# Patient Record
Sex: Female | Born: 1940
Health system: Southern US, Community
[De-identification: ages and names within clinical notes are randomized; demographics above are authoritative.]

## PROBLEM LIST (undated history)

## (undated) DIAGNOSIS — F32A Depression, unspecified: Secondary | ICD-10-CM

## (undated) DIAGNOSIS — F329 Major depressive disorder, single episode, unspecified: Secondary | ICD-10-CM

## (undated) DIAGNOSIS — I10 Essential (primary) hypertension: Secondary | ICD-10-CM

## (undated) DIAGNOSIS — Z8249 Family history of ischemic heart disease and other diseases of the circulatory system: Secondary | ICD-10-CM

## (undated) DIAGNOSIS — Z973 Presence of spectacles and contact lenses: Secondary | ICD-10-CM

## (undated) DIAGNOSIS — K219 Gastro-esophageal reflux disease without esophagitis: Secondary | ICD-10-CM

## (undated) DIAGNOSIS — C801 Malignant (primary) neoplasm, unspecified: Secondary | ICD-10-CM

## (undated) DIAGNOSIS — K760 Fatty (change of) liver, not elsewhere classified: Secondary | ICD-10-CM

## (undated) DIAGNOSIS — E119 Type 2 diabetes mellitus without complications: Secondary | ICD-10-CM

## (undated) DIAGNOSIS — Z972 Presence of dental prosthetic device (complete) (partial): Secondary | ICD-10-CM

## (undated) DIAGNOSIS — E785 Hyperlipidemia, unspecified: Secondary | ICD-10-CM

## (undated) DIAGNOSIS — Z8719 Personal history of other diseases of the digestive system: Secondary | ICD-10-CM

## (undated) DIAGNOSIS — Z9289 Personal history of other medical treatment: Secondary | ICD-10-CM

## (undated) DIAGNOSIS — H8112 Benign paroxysmal vertigo, left ear: Secondary | ICD-10-CM

## (undated) DIAGNOSIS — E039 Hypothyroidism, unspecified: Secondary | ICD-10-CM

## (undated) DIAGNOSIS — M1990 Unspecified osteoarthritis, unspecified site: Secondary | ICD-10-CM

## (undated) HISTORY — DX: Family history of ischemic heart disease and other diseases of the circulatory system: Z82.49

## (undated) HISTORY — DX: Personal history of other medical treatment: Z92.89

## (undated) HISTORY — DX: Hyperlipidemia, unspecified: E78.5

## (undated) HISTORY — PX: CYSTECTOMY: SUR359

## (undated) HISTORY — DX: Essential (primary) hypertension: I10

---

## 1997-11-24 ENCOUNTER — Other Ambulatory Visit: Admission: RE | Admit: 1997-11-24 | Discharge: 1997-11-24 | Payer: Self-pay | Admitting: Obstetrics and Gynecology

## 1999-11-28 ENCOUNTER — Other Ambulatory Visit: Admission: RE | Admit: 1999-11-28 | Discharge: 1999-11-28 | Payer: Self-pay | Admitting: Obstetrics and Gynecology

## 1999-11-28 ENCOUNTER — Encounter (INDEPENDENT_AMBULATORY_CARE_PROVIDER_SITE_OTHER): Payer: Self-pay | Admitting: Specialist

## 2000-02-23 ENCOUNTER — Encounter: Admission: RE | Admit: 2000-02-23 | Discharge: 2000-02-23 | Payer: Self-pay | Admitting: Obstetrics and Gynecology

## 2000-02-23 ENCOUNTER — Encounter: Payer: Self-pay | Admitting: Obstetrics and Gynecology

## 2001-12-08 ENCOUNTER — Ambulatory Visit (HOSPITAL_COMMUNITY): Admission: RE | Admit: 2001-12-08 | Discharge: 2001-12-08 | Payer: Self-pay | Admitting: Gastroenterology

## 2007-09-08 ENCOUNTER — Encounter: Admission: RE | Admit: 2007-09-08 | Discharge: 2007-09-08 | Payer: Self-pay | Admitting: Endocrinology

## 2011-09-28 ENCOUNTER — Ambulatory Visit (INDEPENDENT_AMBULATORY_CARE_PROVIDER_SITE_OTHER): Payer: Medicare Other | Admitting: Surgery

## 2011-09-28 ENCOUNTER — Encounter (INDEPENDENT_AMBULATORY_CARE_PROVIDER_SITE_OTHER): Payer: Self-pay | Admitting: Surgery

## 2011-09-28 VITALS — BP 158/96 | HR 72 | Temp 97.6°F | Resp 18 | Ht 64.0 in | Wt 189.6 lb

## 2011-09-28 DIAGNOSIS — L723 Sebaceous cyst: Secondary | ICD-10-CM

## 2011-09-28 DIAGNOSIS — L089 Local infection of the skin and subcutaneous tissue, unspecified: Secondary | ICD-10-CM

## 2011-09-28 NOTE — Progress Notes (Signed)
Subjective:     Patient ID: Desiree Rasmussen, female   DOB: 1941/03/19, 70 y.o.   MRN: [id number]  HPI This patient is here for evaluation of an infected sebaceous cyst on her left chest. She is referred by Dr. Conservation officer, historic buildings. He started developing erythema and pain several days ago  Review of Systems     Objective:   Physical Exam On exam, she has an infected sebaceous cyst on her left breast at the medial upper quadrant. After obtaining consent, I prepped the area Betadine. Anesthetized with lidocaine. I then drained purulence and sebaceous debris after making an incision with the scalpel. I then packed the wound with gauze    Assessment:     Infected sebaceous cyst of the left breast    Plan:     Wound care instructions were given. I am going to place her on Keflex. I will also write her for hydrocodone. I will see her back in 2 weeks

## 2011-10-02 HISTORY — PX: COLONOSCOPY: SHX174

## 2011-10-18 ENCOUNTER — Encounter (INDEPENDENT_AMBULATORY_CARE_PROVIDER_SITE_OTHER): Payer: Self-pay | Admitting: Surgery

## 2011-10-18 ENCOUNTER — Ambulatory Visit (INDEPENDENT_AMBULATORY_CARE_PROVIDER_SITE_OTHER): Payer: Medicare Other | Admitting: Surgery

## 2011-10-18 VITALS — BP 150/88 | HR 70 | Temp 97.8°F | Resp 18 | Ht 64.0 in | Wt 189.0 lb

## 2011-10-18 DIAGNOSIS — Z09 Encounter for follow-up examination after completed treatment for conditions other than malignant neoplasm: Secondary | ICD-10-CM

## 2011-10-18 NOTE — Progress Notes (Signed)
Subjective:     Patient ID: Desiree Rasmussen, female   DOB: [birth year], 71 y.o.   MRN: [id number]  HPI  She is here for a followup visit status post incision and drainage of a sebaceous cyst on her breast. She has finished her antibiotics. She is doing well and has no complaints. Review of Systems     Objective:   Physical Exam On exam, the site is well healed without evidence of infection    Assessment:     Patient status post incision and drainage of infected sebaceous cyst on the left breast    Plan:     I will see her back in 3 weeks. We will then discuss whether to remove this area if it recurs

## 2011-11-06 ENCOUNTER — Ambulatory Visit (INDEPENDENT_AMBULATORY_CARE_PROVIDER_SITE_OTHER): Payer: Medicare Other | Admitting: Surgery

## 2011-11-06 ENCOUNTER — Encounter (INDEPENDENT_AMBULATORY_CARE_PROVIDER_SITE_OTHER): Payer: Self-pay | Admitting: Surgery

## 2011-11-06 VITALS — BP 136/84 | HR 72 | Temp 97.9°F | Resp 18 | Ht 64.0 in | Wt 190.2 lb

## 2011-11-06 DIAGNOSIS — N6089 Other benign mammary dysplasias of unspecified breast: Secondary | ICD-10-CM

## 2011-11-06 NOTE — Progress Notes (Signed)
Subjective:     Patient ID: Desiree Rasmussen, female   DOB: 1941-01-25, 71 y.o.   MRN: [id number]  HPI She is here for followup. She reports no problems.  Review of Systems     Objective:   Physical Exam    On exam, the incision on the left upper breast is well-healed. There is no current evidence of a sebaceous cyst has returned Assessment:     Patient status post incision and drainage of a sebaceous cyst on her chest    Plan:     Hopefully I got out most of the capsule in this will not return. If it does return I recommended removal. She will come back should this occur

## 2012-01-24 DIAGNOSIS — H524 Presbyopia: Secondary | ICD-10-CM | POA: Diagnosis not present

## 2012-01-24 DIAGNOSIS — H2589 Other age-related cataract: Secondary | ICD-10-CM | POA: Diagnosis not present

## 2012-04-27 ENCOUNTER — Emergency Department (HOSPITAL_COMMUNITY)
Admission: EM | Admit: 2012-04-27 | Discharge: 2012-04-28 | Disposition: A | Discharge status: Home / Self Care | Payer: Medicare Other | Attending: Emergency Medicine | Admitting: Emergency Medicine

## 2012-04-27 ENCOUNTER — Emergency Department (HOSPITAL_COMMUNITY): Payer: Medicare Other

## 2012-04-27 ENCOUNTER — Encounter (HOSPITAL_COMMUNITY): Payer: Self-pay | Admitting: *Deleted

## 2012-04-27 DIAGNOSIS — Z79899 Other long term (current) drug therapy: Secondary | ICD-10-CM | POA: Insufficient documentation

## 2012-04-27 DIAGNOSIS — Z8739 Personal history of other diseases of the musculoskeletal system and connective tissue: Secondary | ICD-10-CM | POA: Insufficient documentation

## 2012-04-27 DIAGNOSIS — R072 Precordial pain: Secondary | ICD-10-CM | POA: Diagnosis not present

## 2012-04-27 DIAGNOSIS — R071 Chest pain on breathing: Secondary | ICD-10-CM | POA: Insufficient documentation

## 2012-04-27 DIAGNOSIS — M25519 Pain in unspecified shoulder: Secondary | ICD-10-CM | POA: Diagnosis not present

## 2012-04-27 DIAGNOSIS — E049 Nontoxic goiter, unspecified: Secondary | ICD-10-CM

## 2012-04-27 DIAGNOSIS — K449 Diaphragmatic hernia without obstruction or gangrene: Secondary | ICD-10-CM | POA: Diagnosis not present

## 2012-04-27 DIAGNOSIS — E785 Hyperlipidemia, unspecified: Secondary | ICD-10-CM | POA: Insufficient documentation

## 2012-04-27 DIAGNOSIS — M25512 Pain in left shoulder: Secondary | ICD-10-CM

## 2012-04-27 DIAGNOSIS — I10 Essential (primary) hypertension: Secondary | ICD-10-CM | POA: Insufficient documentation

## 2012-04-27 DIAGNOSIS — R0789 Other chest pain: Secondary | ICD-10-CM

## 2012-04-27 DIAGNOSIS — R079 Chest pain, unspecified: Secondary | ICD-10-CM | POA: Diagnosis not present

## 2012-04-27 LAB — URINALYSIS, ROUTINE W REFLEX MICROSCOPIC
Hgb urine dipstick: NEGATIVE
Leukocytes, UA: NEGATIVE
Nitrite: NEGATIVE
Specific Gravity, Urine: 1.02 (ref 1.005–1.030)
Urobilinogen, UA: 0.2 mg/dL (ref 0.0–1.0)
pH: 6.5 (ref 5.0–8.0)

## 2012-04-27 LAB — BASIC METABOLIC PANEL
BUN: 22 mg/dL (ref 6–23)
Chloride: 102 mEq/L (ref 96–112)
GFR calc non Af Amer: 72 mL/min — ABNORMAL LOW (ref >90)
Glucose, Bld: 128 mg/dL — ABNORMAL HIGH (ref 70–99)
Potassium: 3.8 mEq/L (ref 3.5–5.1)
Sodium: 140 mEq/L (ref 135–145)

## 2012-04-27 LAB — CBC
HCT: 44.1 % (ref 36.0–46.0)
Hemoglobin: 14.9 g/dL (ref 12.0–15.0)
MCHC: 33.8 g/dL (ref 30.0–36.0)
RBC: 4.69 MIL/uL (ref 3.87–5.11)
WBC: 6.2 10*3/uL (ref 4.0–10.5)

## 2012-04-27 MED ORDER — IOHEXOL 350 MG/ML SOLN
100.0000 mL | Freq: Once | INTRAVENOUS | Status: AC | PRN
  Administered 2012-04-27: 100 mL via INTRAVENOUS

## 2012-04-27 NOTE — ED Provider Notes (Addendum)
History     CSN: [id number]  Arrival date & time 04/27/12  1631   First MD Initiated Contact with Patient 04/27/12 1750      Chief Complaint  Patient presents with  . Shoulder Pain    (Consider location/radiation/quality/duration/timing/severity/associated sxs/prior treatment) HPI Comments: The patient is a 71 year old woman who says that she has been hurting in her left shoulder for the past 3 days. This seemed to start on Friday, 2 days ago. She said she slept with her left eye by another pill and thought this is like a muscle strain. His today the pain was more severe. She took some hydrocodone with some relief. Today the pain was worse, and a continuous pain. She took 3 Advil that didn't help she went to [place] walk-in [facility], where an EKG was done which showed minor changes that were worrisome. Therefore the physician at [facility] walk-in center to [facility] ED for evaluation. There is no heart disease history. She does have high blood pressure, high cholesterol, hypothyroidism, and arthritis.  Patient is a 71 y.o. female presenting with shoulder pain.  Shoulder Pain This is a new problem. The current episode started 2 days ago. Episode frequency: Intermittent episodes of pain. The problem has been gradually worsening. Associated symptoms comments: The pain originates in the left scapular region and radiates under her left axilla into her left upper chest.. Nothing aggravates the symptoms. Nothing relieves the symptoms. Treatments tried: She has taken hydrocodone-acetaminophen, as well as Advil, with some relief of pain with either of those medications.    Past Medical History  Diagnosis Date  . Arthritis   . Hypertension   . Hyperlipidemia   . Cyst     right breast    Past Surgical History  Procedure Date  . Cesarean section 1969, 1972  . Cystectomy     sebaceous cyst on breast    Family History  Problem Relation Age of Onset  . Cancer Mother 46    colon  . Heart disease  Father 48    heart attack  . Cancer Maternal Aunt 71    breast  . Cancer Maternal Uncle     colon    History  Substance Use Topics  . Smoking status: Never Smoker   . Smokeless tobacco: Never Used  . Alcohol Use: No    OB History    Grav Para Term Preterm Abortions TAB SAB Ect Mult Living                  Review of Systems  Constitutional: Negative for fever and chills.  HENT: Negative.   Eyes: Negative.   Respiratory: Negative.   Cardiovascular: Negative.   Gastrointestinal: Negative.   Genitourinary: Negative.   Musculoskeletal:       Left shoulder pain.  Skin: Negative.     Allergies  Azithromycin and Sulfur  Home Medications   Current Outpatient Rx  Name Route Sig Dispense Refill  . ATENOLOL 25 MG PO TABS Oral Take 25 mg by mouth Daily.     Marland Kitchen CLORAZEPATE DIPOTASSIUM 7.5 MG PO TABS Oral Take 7.5 mg by mouth daily.    Marland Kitchen CLOTRIMAZOLE-BETAMETHASONE 1-0.05 % EX CREA Topical Apply 1 application topically 2 (two) times daily.    . IBUPROFEN 200 MG PO TABS Oral Take 600 mg by mouth every 6 (six) hours as needed. For pain    . MELOXICAM 7.5 MG PO TABS Oral Take 7.5 mg by mouth Daily.     Marland Kitchen  SIMVASTATIN 40 MG PO TABS Oral Take 40 mg by mouth Daily.     Marland Kitchen SYNTHROID 100 MCG PO TABS Oral Take 100 mcg by mouth Daily.     . TRIAMTERENE-HCTZ 37.5-25 MG PO TABS Oral Take 0.5 tablets by mouth daily.       BP 190/82  Pulse 74  Temp 98.4 F (36.9 C) (Oral)  Resp 22  Ht 5' 3.5" (1.613 m)  Wt 190 lb (86.183 kg)  BMI 33.13 kg/m2  SpO2 98%  Physical Exam  Constitutional: She is oriented to person, place, and time. She appears well-developed and well-nourished. No distress.  HENT:  Head: Normocephalic and atraumatic.  Right Ear: External ear normal.  Left Ear: External ear normal.  Mouth/Throat: Oropharynx is clear and moist.  Eyes: Conjunctivae and EOM are normal. Pupils are equal, round, and reactive to light.  Neck: Normal range of motion. Neck supple.    Cardiovascular: Normal rate, regular rhythm and normal heart sounds.   Pulmonary/Chest: Effort normal and breath sounds normal.  Abdominal: Soft. Bowel sounds are normal.  Musculoskeletal: Normal range of motion.       Left shoulder has no palpable deformity, has full ROM.  No loss of pulse, sensation and tendon function in the left arm.  Lymphadenopathy:    She has no cervical adenopathy.  Neurological: She is alert and oriented to person, place, and time.       No sensory or motor deficit.  Skin: Skin is warm and dry.  Psychiatric: She has a normal mood and affect. Her behavior is normal.    ED Course  Procedures (including critical care time)   Labs Reviewed  CBC  BASIC METABOLIC PANEL   Dg Chest 2 View  04/27/2012  *RADIOLOGY REPORT*  Clinical Data: Left-sided chest pain.  Shoulder pain.  History of hypertension.  CHEST - 2 VIEW  Comparison: None.  Findings: Heart size is normal.  The superior mediastinum is notable for right to left shift of the trachea.  Findings may be related to enlarged fibroid.  No evidence for pulmonary edema.  No focal consolidations or pleural effusions.  Degenerative changes are seen in the mid thoracic spine.  IMPRESSION:  1. No evidence for acute cardiopulmonary abnormality. 2.  Right to left shift of the trachea in the superior mediastinum, most consistent with enlarged thyroid.  Further evaluation with thyroid ultrasound is suggested.  Original Report Authenticated By: Patterson Hammersmith, M.D.   6:14 PM  Date: 04/27/2012  Rate: 73  Rhythm: normal sinus rhythm  QRS Axis: normal  Intervals: normal  ST/T Wave abnormalities: nonspecific T wave changes  Conduction Disutrbances:none  Narrative Interpretation: Abnormal EKG.  Old EKG Reviewed: none available  9:19 PM Results for orders placed during the hospital encounter of 04/27/12  CBC      Component Value Range   WBC 6.2  4.0 - 10.5 K/uL   RBC 4.69  3.87 - 5.11 MIL/uL   Hemoglobin 14.9  12.0 -  15.0 g/dL   HCT 11.9  14.7 - 82.9 %   MCV 94.0  78.0 - 100.0 fL   MCH 31.8  26.0 - 34.0 pg   MCHC 33.8  30.0 - 36.0 g/dL   RDW 56.2  13.0 - 86.5 %   Platelets 162  150 - 400 K/uL  BASIC METABOLIC PANEL      Component Value Range   Sodium 140  135 - 145 mEq/L   Potassium 3.8  3.5 - 5.1 mEq/L  Chloride 102  96 - 112 mEq/L   CO2 26  19 - 32 mEq/L   Glucose, Bld 128 (*) 70 - 99 mg/dL   BUN 22  6 - 23 mg/dL   Creatinine, Ser 1.61  0.50 - 1.10 mg/dL   Calcium 9.5  8.4 - 09.6 mg/dL   GFR calc non Af Amer 72 (*) >90 mL/min   GFR calc Af Amer 83 (*) >90 mL/min  URINALYSIS, ROUTINE W REFLEX MICROSCOPIC      Component Value Range   Color, Urine YELLOW  YELLOW   APPearance CLEAR  CLEAR   Specific Gravity, Urine 1.020  1.005 - 1.030   pH 6.5  5.0 - 8.0   Glucose, UA NEGATIVE  NEGATIVE mg/dL   Hgb urine dipstick NEGATIVE  NEGATIVE   Bilirubin Urine NEGATIVE  NEGATIVE   Ketones, ur NEGATIVE  NEGATIVE mg/dL   Protein, ur NEGATIVE  NEGATIVE mg/dL   Urobilinogen, UA 0.2  0.0 - 1.0 mg/dL   Nitrite NEGATIVE  NEGATIVE   Leukocytes, UA NEGATIVE  NEGATIVE  D-DIMER, QUANTITATIVE      Component Value Range   D-Dimer, Quant 0.60 (*) 0.00 - 0.48 ug/mL-FEU  POCT I-STAT TROPONIN I      Component Value Range   Troponin i, poc 0.00  0.00 - 0.08 ng/mL   Comment 3            Dg Chest 2 View  04/27/2012  *RADIOLOGY REPORT*  Clinical Data: Left-sided chest pain.  Shoulder pain.  History of hypertension.  CHEST - 2 VIEW  Comparison: None.  Findings: Heart size is normal.  The superior mediastinum is notable for right to left shift of the trachea.  Findings may be related to enlarged fibroid.  No evidence for pulmonary edema.  No focal consolidations or pleural effusions.  Degenerative changes are seen in the mid thoracic spine.  IMPRESSION:  1. No evidence for acute cardiopulmonary abnormality. 2.  Right to left shift of the trachea in the superior mediastinum, most consistent with enlarged thyroid.  Further  evaluation with thyroid ultrasound is suggested.  Original Report Authenticated By: Patterson Hammersmith, M.D.    D-dimer mildly elevated.  Will get CT angio of chest.    No diagnosis found.  4:28 PM Pt had signed out AMA because of prolonged stay and lack of instruction by me of what to do.  Her tests showed no heart attack and no PE, but did show coronary calcification.  I tried to call her home and got her answering machine.  I also called her internist, Renford Dills, M.D., and her cardiologist, Nanetta Batty, M.D., to try to get followup for her.   I was ultimately able to speak to pt around 6 P.M. When she called me back.  I apologized to her for having failed to inform her of her CT result and for making her wait a long time.  She has been to Dr. Allyson Sabal and has followup evaluation arranged.      Carleene Cooper III, MD 04/28/12 1633    Carleene Cooper III, MD 04/29/12 1951     Carleene Cooper III, MD 05/27/12 1134

## 2012-04-27 NOTE — ED Notes (Signed)
Patient has had left shoulder pain and axillary pain for 3 days.  Today her pain is worse and constant.  She denies chest pain.  Denies sob.  Patient was seen at [facility] and has concerns for ekg changes

## 2012-04-28 DIAGNOSIS — E782 Mixed hyperlipidemia: Secondary | ICD-10-CM | POA: Diagnosis not present

## 2012-04-28 DIAGNOSIS — I10 Essential (primary) hypertension: Secondary | ICD-10-CM | POA: Diagnosis not present

## 2012-04-28 DIAGNOSIS — R079 Chest pain, unspecified: Secondary | ICD-10-CM | POA: Diagnosis not present

## 2012-04-28 NOTE — ED Provider Notes (Signed)
71yo F, c/o left sided post shoulder pain radiating into her left anterior chest wall.   Has been intermittent and worsening over the past 3 days. Cannot reproduce the pain by palpation or body positions changes.  Pt states pain was at its worst this morning after she work up and was walking around.  Pt states she was eval at [facility], sent to the ED for concerns regarding an "abnl EKG."  EKG with NSR, NS T-wave changes, no old to compare.  Troponin normal, D-dimer elevated, CT-A chest without PE but +CAD.  Pt is TIMI score 1-2.  Pt and family informed re: dx testing results, including CT-A +CAD and EKG changes which are concerning for cardiac source of her pain, and that I recommend admission for further evaluation.  Pt refuses admission.  I encouraged pt to stay, continues to refuse.  Pt makes her own medical decisions.  Risks of AMA explained to pt and family, including, but not limited to:  stroke, heart attack, cardiac arrythmia ("irregular heart rate/beat"), "passing out," temporary and/or permanent disability, death.  Pt and family verb understanding and continue to refuse admission, understanding the consequences of their decision.  I encouraged pt to follow up with her Cardiologist Dr. Allyson Sabal this morning and return to the ED immediately if symptoms return, or for any other concerns.  Pt and family verb understanding, agreeable.   Laray Anger, DO 04/28/12 [phone]

## 2012-05-01 DIAGNOSIS — E782 Mixed hyperlipidemia: Secondary | ICD-10-CM | POA: Diagnosis not present

## 2012-05-01 DIAGNOSIS — R079 Chest pain, unspecified: Secondary | ICD-10-CM | POA: Diagnosis not present

## 2012-05-01 DIAGNOSIS — Z8249 Family history of ischemic heart disease and other diseases of the circulatory system: Secondary | ICD-10-CM | POA: Diagnosis not present

## 2012-05-01 DIAGNOSIS — I10 Essential (primary) hypertension: Secondary | ICD-10-CM | POA: Diagnosis not present

## 2012-05-01 DIAGNOSIS — Z9289 Personal history of other medical treatment: Secondary | ICD-10-CM

## 2012-05-01 HISTORY — DX: Personal history of other medical treatment: Z92.89

## 2012-05-08 ENCOUNTER — Other Ambulatory Visit: Payer: Self-pay | Admitting: Internal Medicine

## 2012-05-08 DIAGNOSIS — E041 Nontoxic single thyroid nodule: Secondary | ICD-10-CM

## 2012-05-08 DIAGNOSIS — M79609 Pain in unspecified limb: Secondary | ICD-10-CM | POA: Diagnosis not present

## 2012-05-08 DIAGNOSIS — I10 Essential (primary) hypertension: Secondary | ICD-10-CM | POA: Diagnosis not present

## 2012-05-14 ENCOUNTER — Ambulatory Visit
Admission: RE | Admit: 2012-05-14 | Discharge: 2012-05-14 | Disposition: A | Discharge status: Home / Self Care | Payer: Medicare Other | Source: Ambulatory Visit | Attending: Internal Medicine | Admitting: Internal Medicine

## 2012-05-14 DIAGNOSIS — E041 Nontoxic single thyroid nodule: Secondary | ICD-10-CM

## 2012-05-16 DIAGNOSIS — Z0000 Encounter for general adult medical examination without abnormal findings: Secondary | ICD-10-CM | POA: Diagnosis not present

## 2012-05-16 DIAGNOSIS — E041 Nontoxic single thyroid nodule: Secondary | ICD-10-CM | POA: Diagnosis not present

## 2012-05-16 DIAGNOSIS — I10 Essential (primary) hypertension: Secondary | ICD-10-CM | POA: Diagnosis not present

## 2012-05-16 DIAGNOSIS — E7800 Pure hypercholesterolemia, unspecified: Secondary | ICD-10-CM | POA: Diagnosis not present

## 2012-05-16 DIAGNOSIS — E663 Overweight: Secondary | ICD-10-CM | POA: Diagnosis not present

## 2012-05-19 ENCOUNTER — Other Ambulatory Visit: Payer: Self-pay | Admitting: Internal Medicine

## 2012-05-19 DIAGNOSIS — E041 Nontoxic single thyroid nodule: Secondary | ICD-10-CM

## 2012-05-21 ENCOUNTER — Ambulatory Visit
Admission: RE | Admit: 2012-05-21 | Discharge: 2012-05-21 | Disposition: A | Discharge status: Home / Self Care | Payer: Medicare Other | Source: Ambulatory Visit | Attending: Internal Medicine | Admitting: Internal Medicine

## 2012-05-21 ENCOUNTER — Other Ambulatory Visit (HOSPITAL_COMMUNITY)
Admission: RE | Admit: 2012-05-21 | Discharge: 2012-05-21 | Disposition: A | Discharge status: Home / Self Care | Payer: Medicare Other | Source: Ambulatory Visit | Attending: Diagnostic Radiology | Admitting: Diagnostic Radiology

## 2012-05-21 DIAGNOSIS — E041 Nontoxic single thyroid nodule: Secondary | ICD-10-CM | POA: Diagnosis not present

## 2012-05-21 DIAGNOSIS — E049 Nontoxic goiter, unspecified: Secondary | ICD-10-CM | POA: Diagnosis not present

## 2012-06-05 ENCOUNTER — Telehealth: Payer: Self-pay | Admitting: Emergency Medicine

## 2012-06-05 ENCOUNTER — Other Ambulatory Visit: Payer: Self-pay | Admitting: Internal Medicine

## 2012-06-05 DIAGNOSIS — E041 Nontoxic single thyroid nodule: Secondary | ICD-10-CM

## 2012-06-05 NOTE — Telephone Encounter (Signed)
Message copied by Jari Sportsman on Thu Jun 05, 2012  4:35 PM ------      Message from: [facility], ADAM R      Created: Wed Jun 04, 2012  8:48 AM      Regarding: Need another biopsy       This biopsy was non diagnostic.  Does the physician want [place] to try again?            Adam      ----- Message -----         From: [facility] Interface         Sent: 05/22/2012   3:16 PM           To: Abundio Miu, MD

## 2012-06-05 NOTE — Telephone Encounter (Signed)
CALLED PT AND EXPLAINED THAT THE BX RESULTS WERE INCONCLUSIVE AND DR POLITE HAS AGREED FOR [place] TO REPEAT THE FNA.  SCHEDULED APPT W/ PT  FOR 06-10-12 AT 1015/1030AM

## 2012-06-10 ENCOUNTER — Other Ambulatory Visit (HOSPITAL_COMMUNITY)
Admission: RE | Admit: 2012-06-10 | Discharge: 2012-06-10 | Disposition: A | Discharge status: Home / Self Care | Payer: Medicare Other | Source: Ambulatory Visit | Attending: Interventional Radiology | Admitting: Interventional Radiology

## 2012-06-10 ENCOUNTER — Ambulatory Visit
Admission: RE | Admit: 2012-06-10 | Discharge: 2012-06-10 | Disposition: A | Discharge status: Home / Self Care | Payer: Medicare Other | Source: Ambulatory Visit | Attending: Internal Medicine | Admitting: Internal Medicine

## 2012-06-10 DIAGNOSIS — E041 Nontoxic single thyroid nodule: Secondary | ICD-10-CM | POA: Diagnosis not present

## 2012-06-10 DIAGNOSIS — E049 Nontoxic goiter, unspecified: Secondary | ICD-10-CM | POA: Insufficient documentation

## 2012-06-12 DIAGNOSIS — Z1211 Encounter for screening for malignant neoplasm of colon: Secondary | ICD-10-CM | POA: Diagnosis not present

## 2012-06-12 DIAGNOSIS — K5730 Diverticulosis of large intestine without perforation or abscess without bleeding: Secondary | ICD-10-CM | POA: Diagnosis not present

## 2012-07-09 DIAGNOSIS — Z23 Encounter for immunization: Secondary | ICD-10-CM | POA: Diagnosis not present

## 2012-07-31 DIAGNOSIS — M79609 Pain in unspecified limb: Secondary | ICD-10-CM | POA: Diagnosis not present

## 2012-08-14 DIAGNOSIS — M545 Low back pain: Secondary | ICD-10-CM | POA: Diagnosis not present

## 2012-08-15 DIAGNOSIS — Z01419 Encounter for gynecological examination (general) (routine) without abnormal findings: Secondary | ICD-10-CM | POA: Diagnosis not present

## 2012-08-15 DIAGNOSIS — Z0000 Encounter for general adult medical examination without abnormal findings: Secondary | ICD-10-CM | POA: Diagnosis not present

## 2012-08-15 DIAGNOSIS — Z124 Encounter for screening for malignant neoplasm of cervix: Secondary | ICD-10-CM | POA: Diagnosis not present

## 2012-08-21 DIAGNOSIS — E782 Mixed hyperlipidemia: Secondary | ICD-10-CM | POA: Diagnosis not present

## 2012-08-21 DIAGNOSIS — I10 Essential (primary) hypertension: Secondary | ICD-10-CM | POA: Diagnosis not present

## 2012-09-17 DIAGNOSIS — Z1231 Encounter for screening mammogram for malignant neoplasm of breast: Secondary | ICD-10-CM | POA: Diagnosis not present

## 2012-10-15 DIAGNOSIS — M25569 Pain in unspecified knee: Secondary | ICD-10-CM | POA: Diagnosis not present

## 2012-11-19 DIAGNOSIS — I10 Essential (primary) hypertension: Secondary | ICD-10-CM | POA: Diagnosis not present

## 2012-11-19 DIAGNOSIS — E663 Overweight: Secondary | ICD-10-CM | POA: Diagnosis not present

## 2012-11-19 DIAGNOSIS — E7800 Pure hypercholesterolemia, unspecified: Secondary | ICD-10-CM | POA: Diagnosis not present

## 2012-11-19 DIAGNOSIS — E039 Hypothyroidism, unspecified: Secondary | ICD-10-CM | POA: Diagnosis not present

## 2012-12-09 ENCOUNTER — Other Ambulatory Visit: Payer: Self-pay | Admitting: Dermatology

## 2012-12-09 DIAGNOSIS — L723 Sebaceous cyst: Secondary | ICD-10-CM | POA: Diagnosis not present

## 2012-12-09 DIAGNOSIS — D234 Other benign neoplasm of skin of scalp and neck: Secondary | ICD-10-CM | POA: Diagnosis not present

## 2012-12-09 DIAGNOSIS — D2330 Other benign neoplasm of skin of unspecified part of face: Secondary | ICD-10-CM | POA: Diagnosis not present

## 2012-12-09 DIAGNOSIS — L819 Disorder of pigmentation, unspecified: Secondary | ICD-10-CM | POA: Diagnosis not present

## 2012-12-09 DIAGNOSIS — D485 Neoplasm of uncertain behavior of skin: Secondary | ICD-10-CM | POA: Diagnosis not present

## 2012-12-09 DIAGNOSIS — L988 Other specified disorders of the skin and subcutaneous tissue: Secondary | ICD-10-CM | POA: Diagnosis not present

## 2012-12-09 DIAGNOSIS — L821 Other seborrheic keratosis: Secondary | ICD-10-CM | POA: Diagnosis not present

## 2012-12-09 DIAGNOSIS — D239 Other benign neoplasm of skin, unspecified: Secondary | ICD-10-CM | POA: Diagnosis not present

## 2013-01-28 DIAGNOSIS — H2589 Other age-related cataract: Secondary | ICD-10-CM | POA: Diagnosis not present

## 2013-02-03 DIAGNOSIS — L578 Other skin changes due to chronic exposure to nonionizing radiation: Secondary | ICD-10-CM | POA: Diagnosis not present

## 2013-02-03 DIAGNOSIS — L821 Other seborrheic keratosis: Secondary | ICD-10-CM | POA: Diagnosis not present

## 2013-05-18 DIAGNOSIS — I10 Essential (primary) hypertension: Secondary | ICD-10-CM | POA: Diagnosis not present

## 2013-05-18 DIAGNOSIS — Z0000 Encounter for general adult medical examination without abnormal findings: Secondary | ICD-10-CM | POA: Diagnosis not present

## 2013-05-18 DIAGNOSIS — E039 Hypothyroidism, unspecified: Secondary | ICD-10-CM | POA: Diagnosis not present

## 2013-05-18 DIAGNOSIS — Z1331 Encounter for screening for depression: Secondary | ICD-10-CM | POA: Diagnosis not present

## 2013-07-06 DIAGNOSIS — Z23 Encounter for immunization: Secondary | ICD-10-CM | POA: Diagnosis not present

## 2013-07-15 ENCOUNTER — Encounter: Payer: Self-pay | Admitting: *Deleted

## 2013-07-20 ENCOUNTER — Ambulatory Visit (INDEPENDENT_AMBULATORY_CARE_PROVIDER_SITE_OTHER): Payer: Medicare Other | Admitting: Cardiovascular Disease

## 2013-07-20 ENCOUNTER — Encounter: Payer: Self-pay | Admitting: Cardiovascular Disease

## 2013-07-20 VITALS — BP 158/94 | HR 61 | Ht 63.5 in | Wt 193.0 lb

## 2013-07-20 DIAGNOSIS — I10 Essential (primary) hypertension: Secondary | ICD-10-CM | POA: Diagnosis not present

## 2013-07-20 DIAGNOSIS — E785 Hyperlipidemia, unspecified: Secondary | ICD-10-CM

## 2013-07-20 NOTE — Patient Instructions (Signed)
Your physician wants you to follow-up in: 1 year with Dr Berry. You will receive a reminder letter in the mail two months in advance. If you don't receive a letter, please call our office to schedule the follow-up appointment.  

## 2013-07-20 NOTE — Assessment & Plan Note (Signed)
On statin therapy followed by her PCP, Dr. Conservation officer, historic buildings

## 2013-07-20 NOTE — Progress Notes (Signed)
07/20/2013 Desiree Rasmussen   [birth year]  [id number]  Primary Physician Katy Apo, MD Primary Cardiologist: Runell Gess MD Roseanne Reno   HPI:  The patient returns today for followup. She is a 72 year old, mildly overweight, married Caucasian female, mother of two, grandmother to two grandchildren, whose husband is accompanying her and is also a patient of mine. I last saw her four months ago. She has a history of hypertension and hyperlipidemia, as well as a family history of heart disease. She had chest pain and ruled out myocardial infarction on an ER visit earlier this year, and when I saw her she was complaining of atypical chest pain. A Myoview stress test performed in our office on May 01, 2012, was entirely normal. I thought her pain was more musculoskeletal and/or radicular and her symptoms subsequently resolved. Her most recent lipid profile, which was nonfasting was drawn by Dr. Nehemiah Settle in August of last year revealing a total cholesterol of 211, LDL of 106, HDL of 42, and triglyceride level of 379.since I saw her a year ago she denies chest pain or shortness of breath.    Current Outpatient Prescriptions  Medication Sig Dispense Refill  . atenolol (TENORMIN) 25 MG tablet Take 25 mg by mouth Daily.       . clorazepate (TRANXENE) 7.5 MG tablet Take 7.5 mg by mouth daily.      . clotrimazole-betamethasone (LOTRISONE) cream Apply 1 application topically 2 (two) times daily.      Marland Kitchen ibuprofen (ADVIL,MOTRIN) 200 MG tablet Take 600 mg by mouth every 6 (six) hours as needed. For pain      . meloxicam (MOBIC) 7.5 MG tablet Take 7.5 mg by mouth Daily.       . simvastatin (ZOCOR) 40 MG tablet Take 40 mg by mouth Daily.       Marland Kitchen SYNTHROID 100 MCG tablet Take 100 mcg by mouth Daily.       Marland Kitchen triamterene-hydrochlorothiazide (MAXZIDE-25) 37.5-25 MG per tablet Take 1 tablet by mouth daily.        No current facility-administered medications for this visit.    Allergies    Allergen Reactions  . Azithromycin Nausea Only and Other (See Comments)    Caused stomach cramping. Anything in Mycin family caused reaction a long time ago.  . Sulfur Rash    All over the body    History   Social History  . Marital Status: Married    Spouse Name: N/A    Number of Children: 2  . Years of Education: N/A   Occupational History  . Not on file.   Social History Main Topics  . Smoking status: Never Smoker   . Smokeless tobacco: Never Used  . Alcohol Use: No  . Drug Use: No  . Sexual Activity: Not on file   Other Topics Concern  . Not on file   Social History Narrative  . No narrative on file     Review of Systems: General: negative for chills, fever, night sweats or weight changes.  Cardiovascular: negative for chest pain, dyspnea on exertion, edema, orthopnea, palpitations, paroxysmal nocturnal dyspnea or shortness of breath Dermatological: negative for rash Respiratory: negative for cough or wheezing Urologic: negative for hematuria Abdominal: negative for nausea, vomiting, diarrhea, bright red blood per rectum, melena, or hematemesis Neurologic: negative for visual changes, syncope, or dizziness All other systems reviewed and are otherwise negative except as noted above.    Blood pressure 158/94, pulse 61, height 5' 3.5" (  1.613 m), weight 193 lb (87.544 kg).  General appearance: alert and no distress Neck: no adenopathy, no carotid bruit, no JVD, supple, symmetrical, trachea midline and thyroid not enlarged, symmetric, no tenderness/mass/nodules Lungs: clear to auscultation bilaterally Heart: regular rate and rhythm, S1, S2 normal, no murmur, click, rub or gallop Extremities: extremities normal, atraumatic, no cyanosis or edema  EKG sinus rhythm at 61 without ST or T wave changes  ASSESSMENT AND PLAN:   Hyperlipidemia On statin therapy followed by her PCP, Dr. polite  Essential hypertension Borderline control on current  medications      Runell Gess MD [facility] 07/20/2013 9:56 AM

## 2013-07-20 NOTE — Assessment & Plan Note (Signed)
Borderline control on current medications 

## 2013-09-18 ENCOUNTER — Encounter: Payer: Self-pay | Admitting: Cardiovascular Disease

## 2013-09-18 DIAGNOSIS — Z1231 Encounter for screening mammogram for malignant neoplasm of breast: Secondary | ICD-10-CM | POA: Diagnosis not present

## 2013-11-11 DIAGNOSIS — J040 Acute laryngitis: Secondary | ICD-10-CM | POA: Diagnosis not present

## 2013-11-11 DIAGNOSIS — J209 Acute bronchitis, unspecified: Secondary | ICD-10-CM | POA: Diagnosis not present

## 2013-11-16 DIAGNOSIS — J069 Acute upper respiratory infection, unspecified: Secondary | ICD-10-CM | POA: Diagnosis not present

## 2013-11-16 DIAGNOSIS — K120 Recurrent oral aphthae: Secondary | ICD-10-CM | POA: Diagnosis not present

## 2013-11-16 DIAGNOSIS — F4321 Adjustment disorder with depressed mood: Secondary | ICD-10-CM | POA: Diagnosis not present

## 2013-12-14 DIAGNOSIS — E039 Hypothyroidism, unspecified: Secondary | ICD-10-CM | POA: Diagnosis not present

## 2013-12-14 DIAGNOSIS — R21 Rash and other nonspecific skin eruption: Secondary | ICD-10-CM | POA: Diagnosis not present

## 2013-12-14 DIAGNOSIS — F4321 Adjustment disorder with depressed mood: Secondary | ICD-10-CM | POA: Diagnosis not present

## 2013-12-14 DIAGNOSIS — I10 Essential (primary) hypertension: Secondary | ICD-10-CM | POA: Diagnosis not present

## 2013-12-14 DIAGNOSIS — E7800 Pure hypercholesterolemia, unspecified: Secondary | ICD-10-CM | POA: Diagnosis not present

## 2014-02-03 DIAGNOSIS — L821 Other seborrheic keratosis: Secondary | ICD-10-CM | POA: Diagnosis not present

## 2014-02-03 DIAGNOSIS — L570 Actinic keratosis: Secondary | ICD-10-CM | POA: Diagnosis not present

## 2014-02-03 DIAGNOSIS — L608 Other nail disorders: Secondary | ICD-10-CM | POA: Diagnosis not present

## 2014-02-03 DIAGNOSIS — D239 Other benign neoplasm of skin, unspecified: Secondary | ICD-10-CM | POA: Diagnosis not present

## 2014-02-03 DIAGNOSIS — D2360 Other benign neoplasm of skin of unspecified upper limb, including shoulder: Secondary | ICD-10-CM | POA: Diagnosis not present

## 2014-02-03 DIAGNOSIS — D1801 Hemangioma of skin and subcutaneous tissue: Secondary | ICD-10-CM | POA: Diagnosis not present

## 2014-02-23 DIAGNOSIS — J40 Bronchitis, not specified as acute or chronic: Secondary | ICD-10-CM | POA: Diagnosis not present

## 2014-02-25 DIAGNOSIS — T887XXA Unspecified adverse effect of drug or medicament, initial encounter: Secondary | ICD-10-CM | POA: Diagnosis not present

## 2014-02-25 DIAGNOSIS — J40 Bronchitis, not specified as acute or chronic: Secondary | ICD-10-CM | POA: Diagnosis not present

## 2014-03-02 DIAGNOSIS — R300 Dysuria: Secondary | ICD-10-CM | POA: Diagnosis not present

## 2014-05-19 DIAGNOSIS — Z0000 Encounter for general adult medical examination without abnormal findings: Secondary | ICD-10-CM | POA: Diagnosis not present

## 2014-05-19 DIAGNOSIS — E7800 Pure hypercholesterolemia, unspecified: Secondary | ICD-10-CM | POA: Diagnosis not present

## 2014-05-19 DIAGNOSIS — E039 Hypothyroidism, unspecified: Secondary | ICD-10-CM | POA: Diagnosis not present

## 2014-05-19 DIAGNOSIS — I10 Essential (primary) hypertension: Secondary | ICD-10-CM | POA: Diagnosis not present

## 2014-05-19 DIAGNOSIS — N183 Chronic kidney disease, stage 3 unspecified: Secondary | ICD-10-CM | POA: Diagnosis not present

## 2014-05-19 DIAGNOSIS — Z23 Encounter for immunization: Secondary | ICD-10-CM | POA: Diagnosis not present

## 2014-05-19 DIAGNOSIS — E663 Overweight: Secondary | ICD-10-CM | POA: Diagnosis not present

## 2014-05-19 DIAGNOSIS — Z1331 Encounter for screening for depression: Secondary | ICD-10-CM | POA: Diagnosis not present

## 2014-06-16 IMAGING — CT CT ANGIO CHEST
2 of 6 series · 19 of 46 positions shown · IV contrast (APPLIED)
Comparison: 04/27/2012 chest radiograph

CLINICAL DATA: Left-sided chest pain

CT ANGIOGRAPHY CHEST
TECHNIQUE: Multidetector CT imaging of the chest using the
standard protocol during bolus administration of intravenous
contrast. Multiplanar reconstructed images including MIPs were
obtained and reviewed to evaluate the vascular anatomy.
Contrast: 100mL OMNIPAQUE IOHEXOL 350 MG/ML SOLN

[Series 6: pulm embolism 1.0 b25f thin · axial · 0.70mm/px · z∈[+1211,+1467]mm · 16 of 282 slices shown]
[im 13/282  lung]
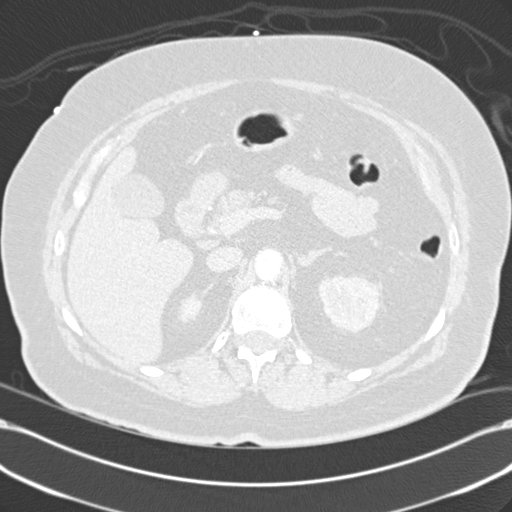
[im 37/282  soft-tissue]
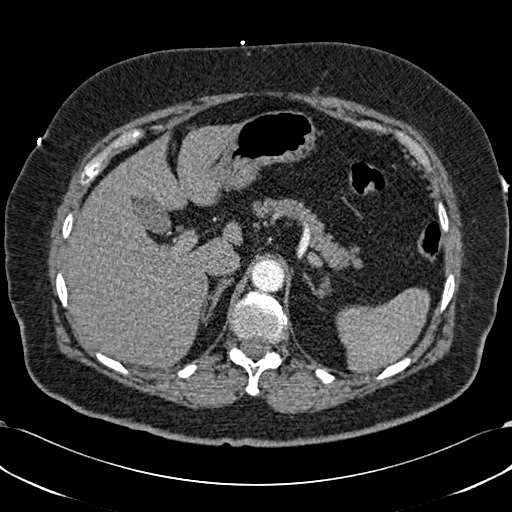
[im 49/282  lung]
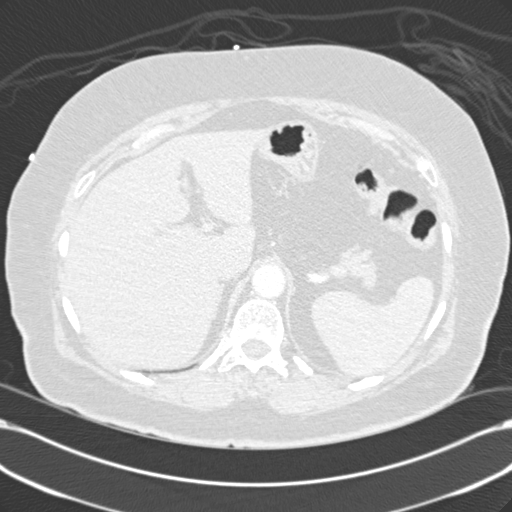
[im 62/282  soft-tissue]
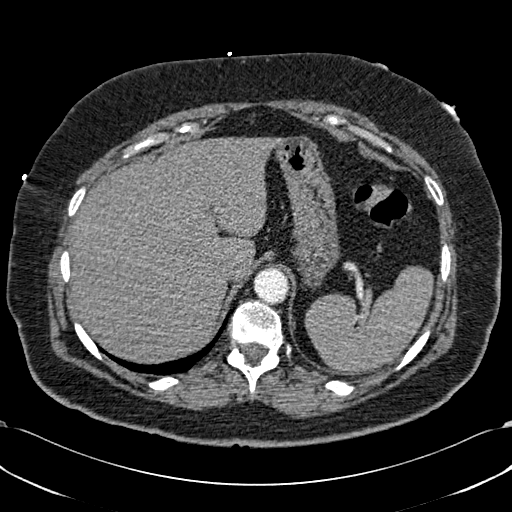
[im 86/282  lung]
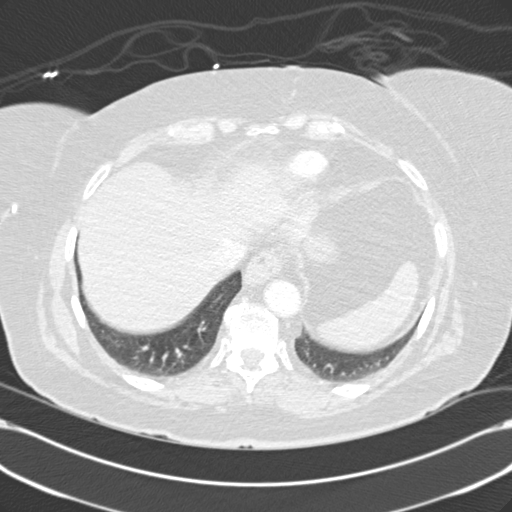
[im 98/282  soft-tissue]
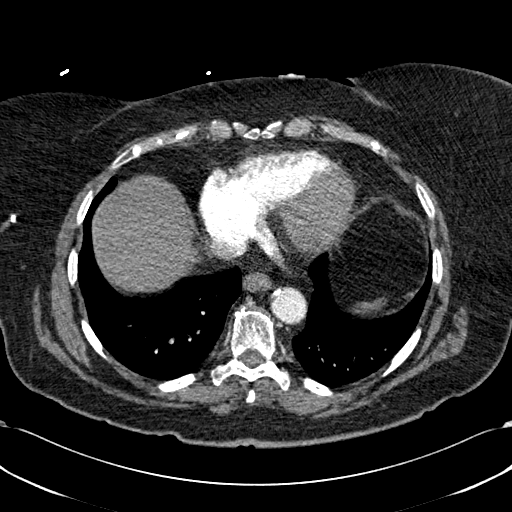
[im 110/282  lung]
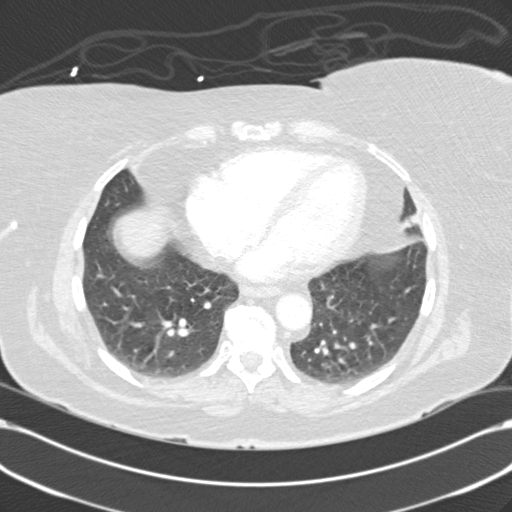
[im 135/282  soft-tissue]
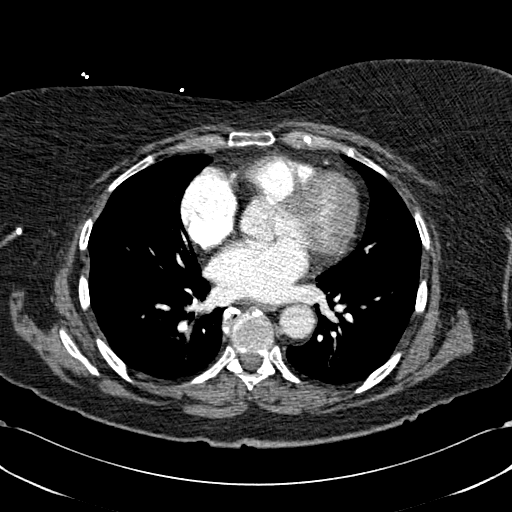
[im 147/282  lung]
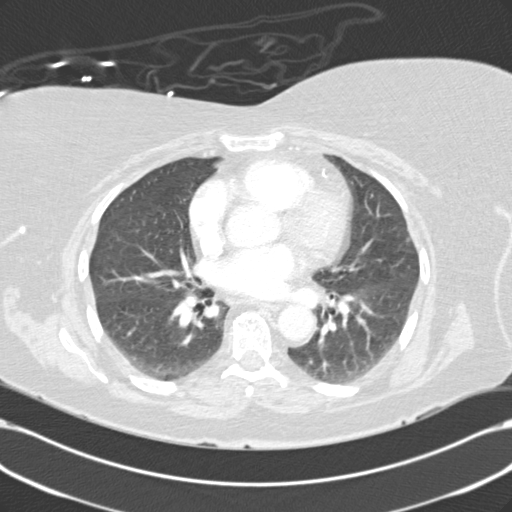
[im 172/282  soft-tissue]
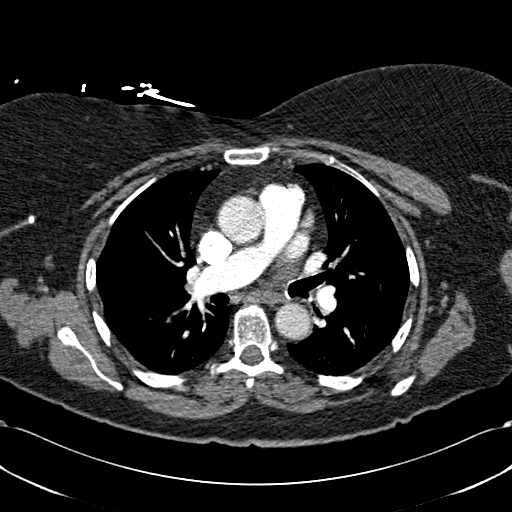
[im 184/282  lung]
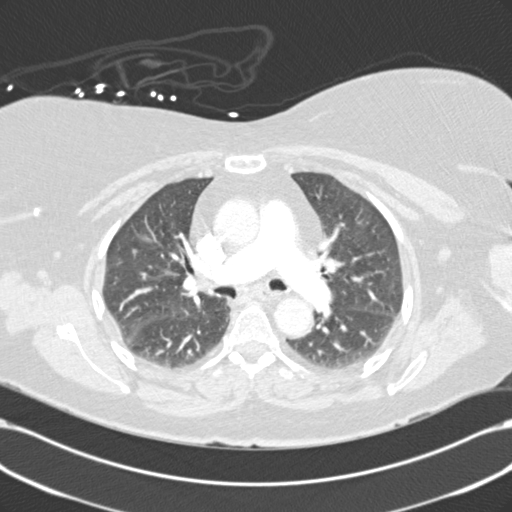
[im 196/282  soft-tissue]
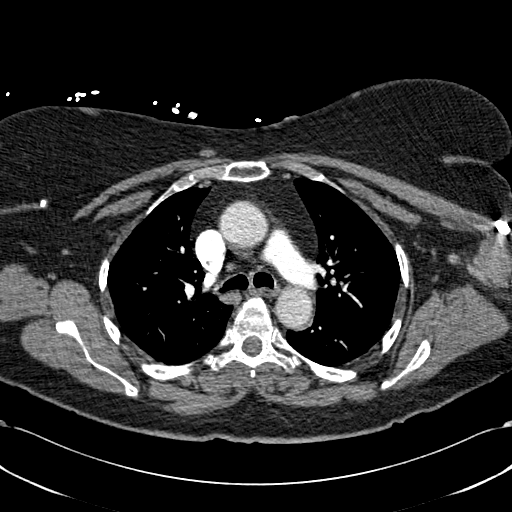
[im 220/282  lung]
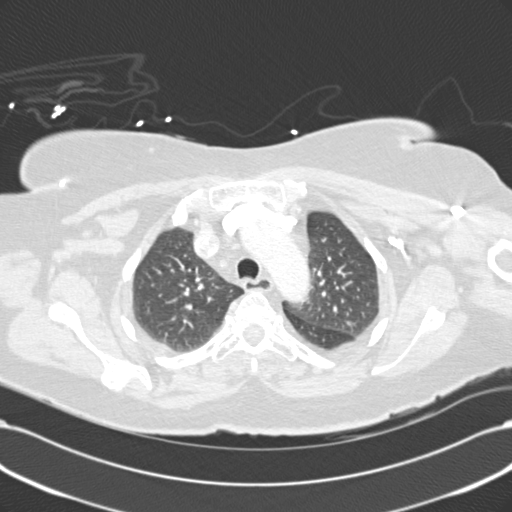
[im 233/282  soft-tissue]
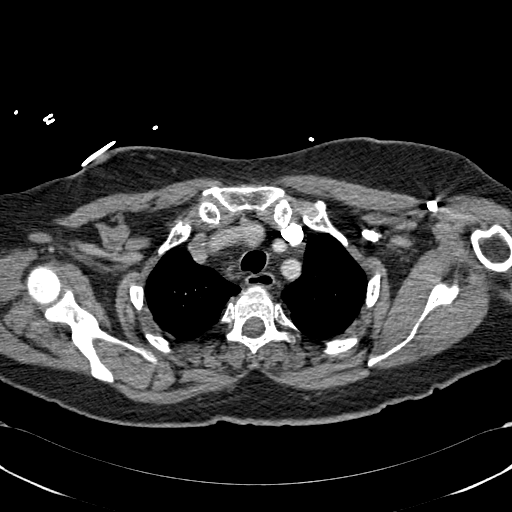
[im 245/282  lung]
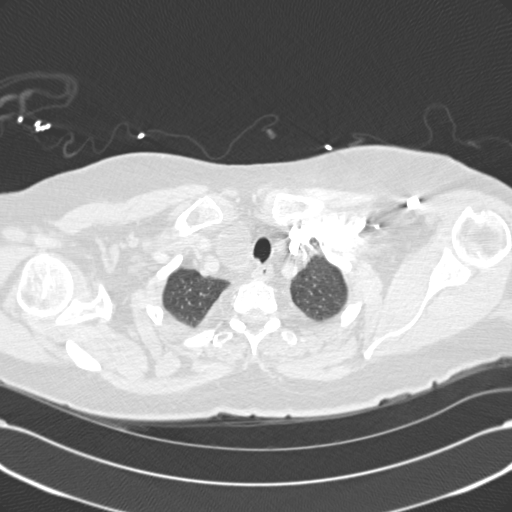
[im 269/282  soft-tissue]
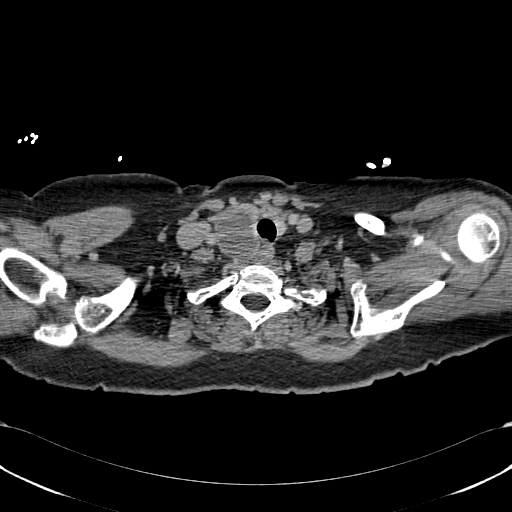

[Series 8: coronals · coronal · 0.70mm/px · 3 of 129 slices shown]
[im 33/129  soft-tissue]
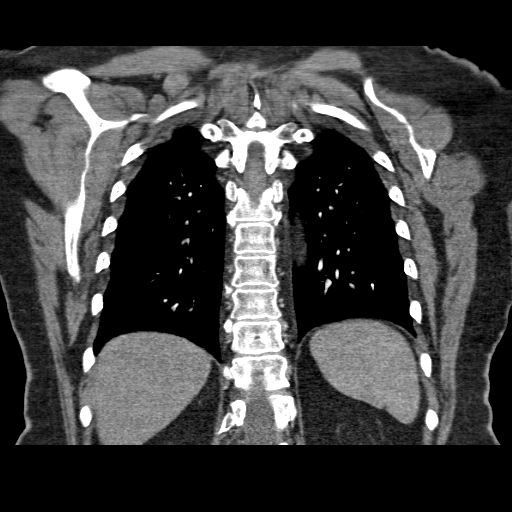
[im 65/129  soft-tissue]
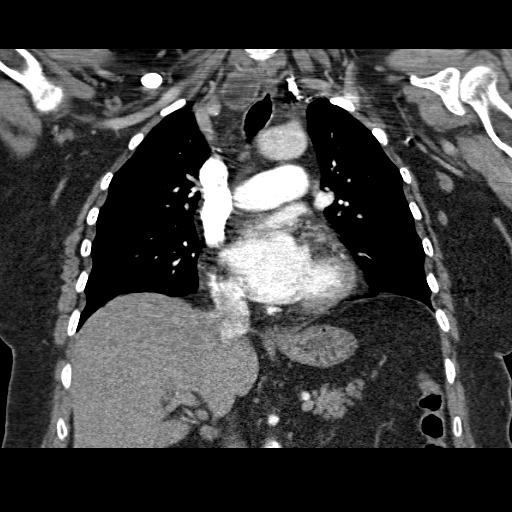
[im 97/129  soft-tissue]
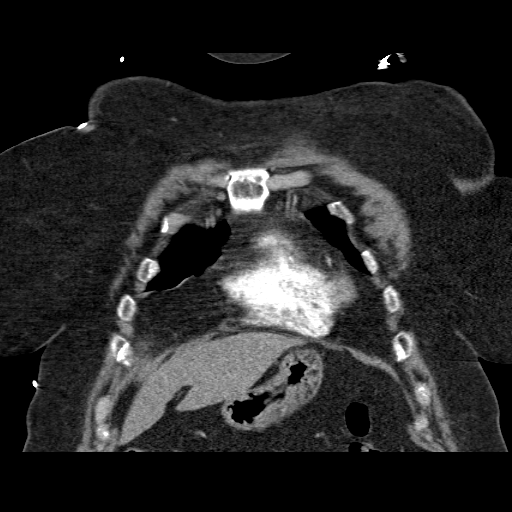

[19 of 46 positions shown; findings below may reference images not displayed]

FINDINGS: Enlarged right lobe of the thyroid gland with a
heterogeneous/hypodense nodule measuring 3.5 cm.  No pulmonary
arterial branch filling defect is identified.  Some of the lower
lobe branches are degraded by respiratory motion and subsegmental
branches are poorly opacified due to bolus timing.

Normal heart size.  Coronary artery calcification.  No pleural or
pericardial effusion.  No intrathoracic lymphadenopathy.  Normal
caliber aorta with scattered atherosclerosis. Tiny hiatal hernia.

Detailed parenchymal evaluation is degraded by respiratory motion.
Within this limitation, no focal consolidation.  No pneumothorax.

Limited images through the upper abdomen demonstrate punctate
calcification within the right hepatic lobe, in keeping with prior
granulomas infection.  No acute finding.

Multilevel degenerative changes of the imaged spine. No acute or
aggressive appearing osseous lesion.
IMPRESSION: No pulmonary embolism or acute intrathoracic process identified.
Some of the peripheral branches are poorly evaluated due to
respiratory motion and contrast bolus timing.

Enlarged right thyroid lobe with a heterogeneous/hypodense nodule.
Recommend ultrasound and FNA if warranted.

Coronary artery calcification.

## 2014-07-12 DIAGNOSIS — Z23 Encounter for immunization: Secondary | ICD-10-CM | POA: Diagnosis not present

## 2014-07-20 ENCOUNTER — Ambulatory Visit (INDEPENDENT_AMBULATORY_CARE_PROVIDER_SITE_OTHER): Payer: Medicare Other | Admitting: Cardiovascular Disease

## 2014-07-20 ENCOUNTER — Encounter: Payer: Self-pay | Admitting: Cardiovascular Disease

## 2014-07-20 VITALS — BP 144/82 | HR 62 | Ht 64.0 in | Wt 191.1 lb

## 2014-07-20 DIAGNOSIS — I10 Essential (primary) hypertension: Secondary | ICD-10-CM

## 2014-07-20 DIAGNOSIS — E785 Hyperlipidemia, unspecified: Secondary | ICD-10-CM | POA: Diagnosis not present

## 2014-07-20 NOTE — Assessment & Plan Note (Signed)
On statin therapy, followed by her PCP 

## 2014-07-20 NOTE — Progress Notes (Signed)
07/20/2014 Desiree Rasmussen   July 04, 1941  [id number]  Primary Physician Desiree Hams, MD Primary Cardiologist: Desiree Harp MD Desiree Rasmussen   HPI:  The patient returns today for followup. She is a 73 year old, mildly overweight, married Caucasian female, mother of two, grandmother to two grandchildren, whose husband , Desiree Rasmussen, unfortunately passed away on 12/02/2013. He was a patient of mine as well. He apparently died of complications of pneumonia. She is currently breathing.. I last saw her 12 months ago. She has a history of hypertension and hyperlipidemia, as well as a family history of heart disease. She had chest pain and ruled out myocardial infarction on an ER visit earlier last year, and when I saw her she was complaining of atypical chest pain. A Myoview stress test performed in our office on May 01, 2012, was entirely normal. I thought her pain was more musculoskeletal and/or radicular and her symptoms subsequently resolved. Her most recent lipid profile, which was nonfasting was drawn by Dr. Delfina Rasmussen in August 2013 revealing a total cholesterol of 211, LDL of 106, HDL of 42, and triglyceride level of 379.since I saw her a year ago she denies chest pain or shortness of breath.    Current Outpatient Prescriptions  Medication Sig Dispense Refill  . atenolol (TENORMIN) 25 MG tablet Take 25 mg by mouth Daily.       . clorazepate (TRANXENE) 7.5 MG tablet Take 7.5 mg by mouth daily.      . Diaper Rash Products (DESITIN) OINT Apply 1 application topically as needed.      Marland Kitchen ibuprofen (ADVIL,MOTRIN) 200 MG tablet Take 600 mg by mouth every 6 (six) hours as needed. For pain      . meloxicam (MOBIC) 7.5 MG tablet Take 7.5 mg by mouth Daily.       . simvastatin (ZOCOR) 40 MG tablet Take 40 mg by mouth Daily.       Marland Kitchen SYNTHROID 100 MCG tablet Take 100 mcg by mouth Daily.       Marland Kitchen triamterene-hydrochlorothiazide (MAXZIDE-25) 37.5-25 MG per tablet Take 0.5 tablets by mouth  daily.        No current facility-administered medications for this visit.    Allergies  Allergen Reactions  . Azithromycin Nausea Only and Other (See Comments)    Caused stomach cramping. Anything in Mycin family caused reaction a long time ago.  . Sulfur Rash    All over the body    History   Social History  . Marital Status: Married    Spouse Name: N/A    Number of Children: 2  . Years of Education: N/A   Occupational History  . Not on file.   Social History Main Topics  . Smoking status: Never Smoker   . Smokeless tobacco: Never Used  . Alcohol Use: No  . Drug Use: No  . Sexual Activity: Not on file   Other Topics Concern  . Not on file   Social History Narrative  . No narrative on file     Review of Systems: General: negative for chills, fever, night sweats or weight changes.  Cardiovascular: negative for chest pain, dyspnea on exertion, edema, orthopnea, palpitations, paroxysmal nocturnal dyspnea or shortness of breath Dermatological: negative for rash Respiratory: negative for cough or wheezing Urologic: negative for hematuria Abdominal: negative for nausea, vomiting, diarrhea, bright red blood per rectum, melena, or hematemesis Neurologic: negative for visual changes, syncope, or dizziness All other systems reviewed and are otherwise negative except  as noted above.    Blood pressure 144/82, pulse 62, height 5\' 4"  (1.626 m), weight 191 lb 1.6 oz (86.682 kg).  General appearance: alert and no distress Neck: no adenopathy, no carotid bruit, no JVD, supple, symmetrical, trachea midline and thyroid not enlarged, symmetric, no tenderness/mass/nodules Lungs: clear to auscultation bilaterally Heart: regular rate and rhythm, S1, S2 normal, no murmur, click, rub or gallop Extremities: extremities normal, atraumatic, no cyanosis or edema  EKG normal sinus rhythm at 62 without ST or T wave changes  ASSESSMENT AND PLAN:   Essential hypertension Controlled on  current medications  Hyperlipidemia On statin therapy, followed by her PCP      Desiree Harp MD [facility], [facility] 07/20/2014 11:20 AM

## 2014-07-20 NOTE — Patient Instructions (Signed)
Dr Berry recommends that you schedule a follow-up appointment in 1 year. You will receive a reminder letter in the mail two months in advance. If you don't receive a letter, please call our office to schedule the follow-up appointment. 

## 2014-07-20 NOTE — Assessment & Plan Note (Signed)
Controlled on current medications 

## 2014-08-13 DIAGNOSIS — H2513 Age-related nuclear cataract, bilateral: Secondary | ICD-10-CM | POA: Diagnosis not present

## 2014-08-13 DIAGNOSIS — H5203 Hypermetropia, bilateral: Secondary | ICD-10-CM | POA: Diagnosis not present

## 2014-08-13 DIAGNOSIS — H52223 Regular astigmatism, bilateral: Secondary | ICD-10-CM | POA: Diagnosis not present

## 2014-08-17 DIAGNOSIS — Z124 Encounter for screening for malignant neoplasm of cervix: Secondary | ICD-10-CM | POA: Diagnosis not present

## 2014-08-17 DIAGNOSIS — Z01419 Encounter for gynecological examination (general) (routine) without abnormal findings: Secondary | ICD-10-CM | POA: Diagnosis not present

## 2014-08-17 DIAGNOSIS — Z0000 Encounter for general adult medical examination without abnormal findings: Secondary | ICD-10-CM | POA: Diagnosis not present

## 2014-09-20 DIAGNOSIS — Z1231 Encounter for screening mammogram for malignant neoplasm of breast: Secondary | ICD-10-CM | POA: Diagnosis not present

## 2014-09-20 DIAGNOSIS — Z803 Family history of malignant neoplasm of breast: Secondary | ICD-10-CM | POA: Diagnosis not present

## 2014-11-03 DIAGNOSIS — M79673 Pain in unspecified foot: Secondary | ICD-10-CM | POA: Diagnosis not present

## 2014-11-08 ENCOUNTER — Encounter: Payer: Medicare Other | Admitting: Podiatry

## 2014-11-08 ENCOUNTER — Ambulatory Visit: Payer: Medicare Other

## 2014-11-08 ENCOUNTER — Ambulatory Visit: Payer: Medicare Other | Admitting: Podiatry

## 2014-11-08 DIAGNOSIS — M79671 Pain in right foot: Secondary | ICD-10-CM

## 2014-11-17 DIAGNOSIS — E780 Pure hypercholesterolemia: Secondary | ICD-10-CM | POA: Diagnosis not present

## 2014-11-17 DIAGNOSIS — I10 Essential (primary) hypertension: Secondary | ICD-10-CM | POA: Diagnosis not present

## 2014-11-17 DIAGNOSIS — Z6834 Body mass index (BMI) 34.0-34.9, adult: Secondary | ICD-10-CM | POA: Diagnosis not present

## 2014-11-17 DIAGNOSIS — M79673 Pain in unspecified foot: Secondary | ICD-10-CM | POA: Diagnosis not present

## 2014-11-17 DIAGNOSIS — N183 Chronic kidney disease, stage 3 (moderate): Secondary | ICD-10-CM | POA: Diagnosis not present

## 2014-11-17 DIAGNOSIS — E039 Hypothyroidism, unspecified: Secondary | ICD-10-CM | POA: Diagnosis not present

## 2014-11-17 DIAGNOSIS — E663 Overweight: Secondary | ICD-10-CM | POA: Diagnosis not present

## 2014-11-18 ENCOUNTER — Ambulatory Visit (INDEPENDENT_AMBULATORY_CARE_PROVIDER_SITE_OTHER): Payer: Medicare Other

## 2014-11-18 ENCOUNTER — Ambulatory Visit (INDEPENDENT_AMBULATORY_CARE_PROVIDER_SITE_OTHER): Payer: Medicare Other | Admitting: Podiatry

## 2014-11-18 ENCOUNTER — Encounter: Payer: Self-pay | Admitting: Podiatry

## 2014-11-18 VITALS — BP 159/81 | HR 61 | Resp 13 | Ht 64.0 in | Wt 195.0 lb

## 2014-11-18 DIAGNOSIS — M722 Plantar fascial fibromatosis: Secondary | ICD-10-CM | POA: Diagnosis not present

## 2014-11-18 MED ORDER — TRIAMCINOLONE ACETONIDE 10 MG/ML IJ SUSP
10.0000 mg | Freq: Once | INTRAMUSCULAR | Status: AC
  Administered 2014-11-18: 10 mg

## 2014-11-18 NOTE — Patient Instructions (Signed)

## 2014-11-18 NOTE — Progress Notes (Signed)
Subjective:     Patient ID: Desiree Rasmussen, female   DOB: 09-May-1941, 75 y.o.   MRN: [id number]  HPI patient states she's developed a lot of pain in the plantar aspect of her right heel and it's been going on for at least a month. States she's been walking differently and developing pain on the outside of her right foot and also her left foot is starting to hurt from putting extra pressure on   Review of Systems  All other systems reviewed and are negative.      Objective:   Physical Exam  Constitutional: She is oriented to person, place, and time.  Cardiovascular: Intact distal pulses.   Musculoskeletal: Normal range of motion.  Neurological: She is oriented to person, place, and time.  Skin: Skin is warm.  Nursing note and vitals reviewed.  neurovascular status found to be intact with muscle strength adequate range of motion subtalar midtarsal joint within normal limits. Patient's noted to have severe discomfort plantar aspect right heel at the insertion of the tendon into the calcaneus that's very sore when pressed and is also noted to have mild equinus condition.     Assessment:     Plantar fasciitis right of an acute nature with compensatory changes noted    Plan:     H&P and x-rays reviewed and today injected the right plantar fascia 3 mg Kenalog 5 g Xylocaine and applied fascial brace. Instructed on reduced activity and physical therapy and will be seen back to recheck

## 2014-11-18 NOTE — Progress Notes (Signed)
   Subjective:    Patient ID: Desiree Rasmussen, female    DOB: [birth year], 74 y.o.   MRN: [id number]  HPI Comments: Pt complains of right plantar heel pain for 1 month, pt has treated with warm water soaks, and DEEP HEAT cream.     Review of Systems  Psychiatric/Behavioral: The patient is nervous/anxious.   All other systems reviewed and are negative.      Objective:   Physical Exam        Assessment & Plan:

## 2014-11-25 ENCOUNTER — Ambulatory Visit (INDEPENDENT_AMBULATORY_CARE_PROVIDER_SITE_OTHER): Payer: Medicare Other | Admitting: Podiatry

## 2014-11-25 ENCOUNTER — Encounter: Payer: Self-pay | Admitting: Podiatry

## 2014-11-25 VITALS — BP 145/72 | HR 62 | Resp 12

## 2014-11-25 DIAGNOSIS — M79673 Pain in unspecified foot: Secondary | ICD-10-CM

## 2014-11-25 DIAGNOSIS — M722 Plantar fascial fibromatosis: Secondary | ICD-10-CM

## 2014-11-25 MED ORDER — TRIAMCINOLONE ACETONIDE 10 MG/ML IJ SUSP
10.0000 mg | Freq: Once | INTRAMUSCULAR | Status: AC
  Administered 2014-11-25: 10 mg

## 2014-11-25 NOTE — Progress Notes (Signed)
Subjective:     Patient ID: Desiree Rasmussen, female   DOB: 1941-03-01, 74 y.o.   MRN: [id number]  HPI patient presents stating I'm doing better but still having some pain in the bottom of my right heel   Review of Systems     Objective:   Physical Exam Neurovascular status intact with discomfort still noted in the medial band right heel at the insertion of the tendon into the calcaneus    Assessment:     Plantar fasciitis right with inflammation and fluid buildup    Plan:     At this time I did advised on continuing with physical therapy anti-inflammatories and I reinjected the plantar fascia 3 mg Kenalog 5 mg Xylocaine. Gave instructions on physical therapy and supportive shoes

## 2015-01-14 DIAGNOSIS — J069 Acute upper respiratory infection, unspecified: Secondary | ICD-10-CM | POA: Diagnosis not present

## 2015-01-14 DIAGNOSIS — J309 Allergic rhinitis, unspecified: Secondary | ICD-10-CM | POA: Diagnosis not present

## 2015-03-01 NOTE — Progress Notes (Signed)
This encounter was created in error - please disregard.

## 2015-03-03 ENCOUNTER — Other Ambulatory Visit: Payer: Self-pay | Admitting: Internal Medicine

## 2015-03-03 DIAGNOSIS — R1011 Right upper quadrant pain: Secondary | ICD-10-CM | POA: Diagnosis not present

## 2015-03-03 DIAGNOSIS — R110 Nausea: Secondary | ICD-10-CM | POA: Diagnosis not present

## 2015-03-08 ENCOUNTER — Ambulatory Visit
Admission: RE | Admit: 2015-03-08 | Discharge: 2015-03-08 | Disposition: A | Discharge status: Home / Self Care | Payer: Medicare Other | Source: Ambulatory Visit | Attending: Internal Medicine | Admitting: Internal Medicine

## 2015-03-08 DIAGNOSIS — R1011 Right upper quadrant pain: Secondary | ICD-10-CM

## 2015-04-06 DIAGNOSIS — H10023 Other mucopurulent conjunctivitis, bilateral: Secondary | ICD-10-CM | POA: Diagnosis not present

## 2015-04-07 DIAGNOSIS — R1011 Right upper quadrant pain: Secondary | ICD-10-CM | POA: Diagnosis not present

## 2015-04-07 DIAGNOSIS — R0789 Other chest pain: Secondary | ICD-10-CM | POA: Diagnosis not present

## 2015-04-07 DIAGNOSIS — F411 Generalized anxiety disorder: Secondary | ICD-10-CM | POA: Diagnosis not present

## 2015-06-16 DIAGNOSIS — Z6834 Body mass index (BMI) 34.0-34.9, adult: Secondary | ICD-10-CM | POA: Diagnosis not present

## 2015-06-16 DIAGNOSIS — E780 Pure hypercholesterolemia: Secondary | ICD-10-CM | POA: Diagnosis not present

## 2015-06-16 DIAGNOSIS — Z1389 Encounter for screening for other disorder: Secondary | ICD-10-CM | POA: Diagnosis not present

## 2015-06-16 DIAGNOSIS — N183 Chronic kidney disease, stage 3 (moderate): Secondary | ICD-10-CM | POA: Diagnosis not present

## 2015-06-16 DIAGNOSIS — Z0000 Encounter for general adult medical examination without abnormal findings: Secondary | ICD-10-CM | POA: Diagnosis not present

## 2015-06-16 DIAGNOSIS — I10 Essential (primary) hypertension: Secondary | ICD-10-CM | POA: Diagnosis not present

## 2015-06-16 DIAGNOSIS — E039 Hypothyroidism, unspecified: Secondary | ICD-10-CM | POA: Diagnosis not present

## 2015-06-16 DIAGNOSIS — E663 Overweight: Secondary | ICD-10-CM | POA: Diagnosis not present

## 2015-07-06 DIAGNOSIS — Z23 Encounter for immunization: Secondary | ICD-10-CM | POA: Diagnosis not present

## 2015-07-12 DIAGNOSIS — N3001 Acute cystitis with hematuria: Secondary | ICD-10-CM | POA: Diagnosis not present

## 2015-07-12 DIAGNOSIS — R300 Dysuria: Secondary | ICD-10-CM | POA: Diagnosis not present

## 2015-07-20 ENCOUNTER — Ambulatory Visit: Payer: Medicare Other | Admitting: Cardiovascular Disease

## 2015-07-27 ENCOUNTER — Ambulatory Visit (INDEPENDENT_AMBULATORY_CARE_PROVIDER_SITE_OTHER): Payer: Medicare Other | Admitting: Cardiovascular Disease

## 2015-07-27 ENCOUNTER — Encounter: Payer: Self-pay | Admitting: Cardiovascular Disease

## 2015-07-27 VITALS — BP 136/80 | HR 74 | Ht 64.0 in | Wt 196.0 lb

## 2015-07-27 DIAGNOSIS — I10 Essential (primary) hypertension: Secondary | ICD-10-CM

## 2015-07-27 DIAGNOSIS — E785 Hyperlipidemia, unspecified: Secondary | ICD-10-CM

## 2015-07-27 NOTE — Assessment & Plan Note (Signed)
History of hyperlipidemia on simvastatin followed by her PCP 

## 2015-07-27 NOTE — Progress Notes (Signed)
07/27/2015 Desiree Rasmussen   [birth year]  [id number]  Primary Physician Kandice Hams, MD Primary Cardiologist: Lorretta Harp MD [name]   HPI:  The patient returns today for followup. She is a 74 year old, mildly overweight, married Caucasian female, mother of two, grandmother to two grandchildren, whose husband , Desiree Rasmussen, unfortunately passed away on 2013/11/24. He was a patient of mine as well. He apparently died of complications of pneumonia.  I last saw her 12 months ago. She has a history of hypertension and hyperlipidemia, as well as a family history of heart disease. She had chest pain and ruled out myocardial infarction on an ER visit earlier last year, and when I saw her she was complaining of atypical chest pain. A Myoview stress test performed in our office on May 01, 2012, was entirely normal. I thought her pain was more musculoskeletal and/or radicular and her symptoms subsequently resolved. Since I saw her a year ago she's remained fairly stable and is asymptomatic.  Current Outpatient Prescriptions  Medication Sig Dispense Refill  . atenolol (TENORMIN) 25 MG tablet Take 25 mg by mouth Daily.     . clorazepate (TRANXENE) 7.5 MG tablet Take 7.5 mg by mouth daily.    . Diaper Rash Products (DESITIN) OINT Apply 1 application topically as needed.    . meloxicam (MOBIC) 7.5 MG tablet Take 7.5 mg by mouth Daily.     . simvastatin (ZOCOR) 40 MG tablet Take 40 mg by mouth Daily.     Marland Kitchen SYNTHROID 100 MCG tablet Take 100 mcg by mouth Daily.     Marland Kitchen triamterene-hydrochlorothiazide (MAXZIDE-25) 37.5-25 MG per tablet Take 0.5 tablets by mouth daily.      No current facility-administered medications for this visit.    Allergies  Allergen Reactions  . Azithromycin Nausea Only and Other (See Comments)    Caused stomach cramping. Anything in Mycin family caused reaction a long time ago.  . Ibuprofen Nausea And Vomiting  . Sulfur Rash    All over the body     Social History   Social History  . Marital Status: Married    Spouse Name: N/A  . Number of Children: 2  . Years of Education: N/A   Occupational History  . Not on file.   Social History Main Topics  . Smoking status: Never Smoker   . Smokeless tobacco: Never Used  . Alcohol Use: No  . Drug Use: No  . Sexual Activity: Not on file   Other Topics Concern  . Not on file   Social History Narrative     Review of Systems: General: negative for chills, fever, night sweats or weight changes.  Cardiovascular: negative for chest pain, dyspnea on exertion, edema, orthopnea, palpitations, paroxysmal nocturnal dyspnea or shortness of breath Dermatological: negative for rash Respiratory: negative for cough or wheezing Urologic: negative for hematuria Abdominal: negative for nausea, vomiting, diarrhea, bright red blood per rectum, melena, or hematemesis Neurologic: negative for visual changes, syncope, or dizziness All other systems reviewed and are otherwise negative except as noted above.    Blood pressure 136/80, pulse 74, height 5\' 4"  (1.626 m), weight 196 lb (88.905 kg).  General appearance: alert and no distress Neck: no adenopathy, no carotid bruit, no JVD, supple, symmetrical, trachea midline and thyroid not enlarged, symmetric, no tenderness/mass/nodules Lungs: clear to auscultation bilaterally Heart: regular rate and rhythm, S1, S2 normal, no murmur, click, rub or gallop Extremities: extremities normal, atraumatic, no cyanosis or edema  EKG  normal sinus rhythm at 74 without ST or T-wave changes. I personally reviewed this EKG  ASSESSMENT AND PLAN:   Hyperlipidemia History of hyper-lipidemia on simvastatin followed by her PCP  Essential hypertension History of hypertension blood pressure measured at 136/80. Patient is on atenolol and Maxzide. Continue current meds at current dosing      Lorretta Harp MD [facility], [facility] 07/27/2015 11:19 AM

## 2015-07-27 NOTE — Patient Instructions (Signed)

## 2015-07-27 NOTE — Assessment & Plan Note (Signed)
History of hypertension blood pressure measured at 136/80. Patient is on atenolol and Maxzide. Continue current meds at current dosing

## 2015-08-01 ENCOUNTER — Encounter: Payer: Self-pay | Admitting: Cardiovascular Disease

## 2015-09-22 DIAGNOSIS — Z1231 Encounter for screening mammogram for malignant neoplasm of breast: Secondary | ICD-10-CM | POA: Diagnosis not present

## 2015-12-14 DIAGNOSIS — E039 Hypothyroidism, unspecified: Secondary | ICD-10-CM | POA: Diagnosis not present

## 2015-12-14 DIAGNOSIS — E663 Overweight: Secondary | ICD-10-CM | POA: Diagnosis not present

## 2015-12-14 DIAGNOSIS — R739 Hyperglycemia, unspecified: Secondary | ICD-10-CM | POA: Diagnosis not present

## 2015-12-14 DIAGNOSIS — Z6834 Body mass index (BMI) 34.0-34.9, adult: Secondary | ICD-10-CM | POA: Diagnosis not present

## 2015-12-14 DIAGNOSIS — E7800 Pure hypercholesterolemia, unspecified: Secondary | ICD-10-CM | POA: Diagnosis not present

## 2015-12-14 DIAGNOSIS — N183 Chronic kidney disease, stage 3 (moderate): Secondary | ICD-10-CM | POA: Diagnosis not present

## 2015-12-14 DIAGNOSIS — I10 Essential (primary) hypertension: Secondary | ICD-10-CM | POA: Diagnosis not present

## 2015-12-19 DIAGNOSIS — R7301 Impaired fasting glucose: Secondary | ICD-10-CM | POA: Diagnosis not present

## 2016-02-07 DIAGNOSIS — D2262 Melanocytic nevi of left upper limb, including shoulder: Secondary | ICD-10-CM | POA: Diagnosis not present

## 2016-02-07 DIAGNOSIS — D225 Melanocytic nevi of trunk: Secondary | ICD-10-CM | POA: Diagnosis not present

## 2016-02-07 DIAGNOSIS — L821 Other seborrheic keratosis: Secondary | ICD-10-CM | POA: Diagnosis not present

## 2016-02-07 DIAGNOSIS — D2261 Melanocytic nevi of right upper limb, including shoulder: Secondary | ICD-10-CM | POA: Diagnosis not present

## 2016-02-07 DIAGNOSIS — L814 Other melanin hyperpigmentation: Secondary | ICD-10-CM | POA: Diagnosis not present

## 2016-02-07 DIAGNOSIS — D1801 Hemangioma of skin and subcutaneous tissue: Secondary | ICD-10-CM | POA: Diagnosis not present

## 2016-02-07 DIAGNOSIS — D2271 Melanocytic nevi of right lower limb, including hip: Secondary | ICD-10-CM | POA: Diagnosis not present

## 2016-02-07 DIAGNOSIS — D2239 Melanocytic nevi of other parts of face: Secondary | ICD-10-CM | POA: Diagnosis not present

## 2016-02-07 DIAGNOSIS — D224 Melanocytic nevi of scalp and neck: Secondary | ICD-10-CM | POA: Diagnosis not present

## 2016-02-16 DIAGNOSIS — H35033 Hypertensive retinopathy, bilateral: Secondary | ICD-10-CM | POA: Diagnosis not present

## 2016-02-16 DIAGNOSIS — H2513 Age-related nuclear cataract, bilateral: Secondary | ICD-10-CM | POA: Diagnosis not present

## 2016-02-16 DIAGNOSIS — H524 Presbyopia: Secondary | ICD-10-CM | POA: Diagnosis not present

## 2016-02-16 DIAGNOSIS — H52223 Regular astigmatism, bilateral: Secondary | ICD-10-CM | POA: Diagnosis not present

## 2016-02-16 DIAGNOSIS — I10 Essential (primary) hypertension: Secondary | ICD-10-CM | POA: Diagnosis not present

## 2016-02-16 DIAGNOSIS — H5203 Hypermetropia, bilateral: Secondary | ICD-10-CM | POA: Diagnosis not present

## 2016-02-16 DIAGNOSIS — H43813 Vitreous degeneration, bilateral: Secondary | ICD-10-CM | POA: Diagnosis not present

## 2016-03-27 DIAGNOSIS — R7309 Other abnormal glucose: Secondary | ICD-10-CM | POA: Diagnosis not present

## 2016-04-05 DIAGNOSIS — M25562 Pain in left knee: Secondary | ICD-10-CM | POA: Diagnosis not present

## 2016-04-05 DIAGNOSIS — M25561 Pain in right knee: Secondary | ICD-10-CM | POA: Diagnosis not present

## 2016-04-20 DIAGNOSIS — M25561 Pain in right knee: Secondary | ICD-10-CM | POA: Diagnosis not present

## 2016-04-20 DIAGNOSIS — I10 Essential (primary) hypertension: Secondary | ICD-10-CM | POA: Diagnosis not present

## 2016-05-24 DIAGNOSIS — J069 Acute upper respiratory infection, unspecified: Secondary | ICD-10-CM | POA: Diagnosis not present

## 2016-05-24 DIAGNOSIS — F419 Anxiety disorder, unspecified: Secondary | ICD-10-CM | POA: Diagnosis not present

## 2016-07-02 DIAGNOSIS — Z23 Encounter for immunization: Secondary | ICD-10-CM | POA: Diagnosis not present

## 2016-07-02 DIAGNOSIS — I10 Essential (primary) hypertension: Secondary | ICD-10-CM | POA: Diagnosis not present

## 2016-07-02 DIAGNOSIS — E1122 Type 2 diabetes mellitus with diabetic chronic kidney disease: Secondary | ICD-10-CM | POA: Diagnosis not present

## 2016-07-02 DIAGNOSIS — E663 Overweight: Secondary | ICD-10-CM | POA: Diagnosis not present

## 2016-07-02 DIAGNOSIS — N183 Chronic kidney disease, stage 3 (moderate): Secondary | ICD-10-CM | POA: Diagnosis not present

## 2016-07-02 DIAGNOSIS — Z1389 Encounter for screening for other disorder: Secondary | ICD-10-CM | POA: Diagnosis not present

## 2016-07-02 DIAGNOSIS — E7800 Pure hypercholesterolemia, unspecified: Secondary | ICD-10-CM | POA: Diagnosis not present

## 2016-07-02 DIAGNOSIS — Z0000 Encounter for general adult medical examination without abnormal findings: Secondary | ICD-10-CM | POA: Diagnosis not present

## 2016-07-02 DIAGNOSIS — F419 Anxiety disorder, unspecified: Secondary | ICD-10-CM | POA: Diagnosis not present

## 2016-07-02 DIAGNOSIS — Z6834 Body mass index (BMI) 34.0-34.9, adult: Secondary | ICD-10-CM | POA: Diagnosis not present

## 2016-07-02 DIAGNOSIS — E039 Hypothyroidism, unspecified: Secondary | ICD-10-CM | POA: Diagnosis not present

## 2016-07-27 ENCOUNTER — Encounter: Payer: Self-pay | Admitting: Cardiovascular Disease

## 2016-07-27 ENCOUNTER — Ambulatory Visit (INDEPENDENT_AMBULATORY_CARE_PROVIDER_SITE_OTHER): Payer: Medicare Other | Admitting: Cardiovascular Disease

## 2016-07-27 VITALS — BP 140/80 | HR 67 | Ht 64.0 in | Wt 196.0 lb

## 2016-07-27 DIAGNOSIS — E7800 Pure hypercholesterolemia, unspecified: Secondary | ICD-10-CM

## 2016-07-27 DIAGNOSIS — I10 Essential (primary) hypertension: Secondary | ICD-10-CM

## 2016-07-27 NOTE — Assessment & Plan Note (Signed)
History of hypertension with blood pressure measured at 140/80. She is on atenolol. Continue current meds are current dosing

## 2016-07-27 NOTE — Patient Instructions (Signed)
Medication Instructions:  Continue current medications.   Labwork: Labwork will be requested from your primary care physician.   Follow-Up: Your physician wants you to follow-up in: 12 MONTHS WITH DR BERRY.  You will receive a reminder letter in the mail two months in advance. If you don't receive a letter, please call our office to schedule the follow-up appointment.   If you need a refill on your cardiac medications before your next appointment, please call your pharmacy.   

## 2016-07-27 NOTE — Assessment & Plan Note (Signed)
History of hyperlipidemia on statin therapy followed by her PCP. 

## 2016-07-27 NOTE — Progress Notes (Signed)
07/27/2016 Desiree Rasmussen   Sep 07, 1941  [id number]  Primary Physician Kandice Hams, MD Primary Cardiologist: Lorretta Harp MD [name]  HPI:  The patient returns today for followup. She is a 75 year old, mildly overweight, married Caucasian female, mother of two, grandmother to two grandchildren, whose husband , Desiree Rasmussen, unfortunately passed away on 11/26/13. He was a patient of mine as well. He apparently died of complications of pneumonia.  I last saw her 12 months ago. I last saw her in the office 07/27/15. She has a history of hypertension and hyperlipidemia, as well as a family history of heart disease. She had chest pain and ruled out myocardial infarction on an ER visit earlier last year, and when I saw her she was complaining of atypical chest pain. A Myoview stress test performed in our office on May 01, 2012, was entirely normal. I thought her pain was more musculoskeletal and/or radicular and her symptoms subsequently resolved. Since I saw her a year ago she's remained fairly stable and is asymptomatic.   Current Outpatient Prescriptions  Medication Sig Dispense Refill  . atenolol (TENORMIN) 25 MG tablet Take 25 mg by mouth Daily.     . clorazepate (TRANXENE) 7.5 MG tablet Take 7.5 mg by mouth daily.    . Diaper Rash Products (DESITIN) OINT Apply 1 application topically as needed.    . meloxicam (MOBIC) 7.5 MG tablet Take 7.5 mg by mouth Daily.     . simvastatin (ZOCOR) 40 MG tablet Take 40 mg by mouth Daily.     Marland Kitchen SYNTHROID 100 MCG tablet Take 100 mcg by mouth Daily.     Marland Kitchen triamterene-hydrochlorothiazide (MAXZIDE-25) 37.5-25 MG per tablet Take 0.5 tablets by mouth daily.      No current facility-administered medications for this visit.     Allergies  Allergen Reactions  . Azithromycin Nausea Only and Other (See Comments)    Caused stomach cramping. Anything in Mycin family caused reaction a long time ago.  . Ibuprofen Nausea And Vomiting  .  Sulfur Rash    All over the body    Social History   Social History  . Marital status: Married    Spouse name: N/A  . Number of children: 2  . Years of education: N/A   Occupational History  . Not on file.   Social History Main Topics  . Smoking status: Never Smoker  . Smokeless tobacco: Never Used  . Alcohol use No  . Drug use: No  . Sexual activity: Not on file   Other Topics Concern  . Not on file   Social History Narrative  . No narrative on file     Review of Systems: General: negative for chills, fever, night sweats or weight changes.  Cardiovascular: negative for chest pain, dyspnea on exertion, edema, orthopnea, palpitations, paroxysmal nocturnal dyspnea or shortness of breath Dermatological: negative for rash Respiratory: negative for cough or wheezing Urologic: negative for hematuria Abdominal: negative for nausea, vomiting, diarrhea, bright red blood per rectum, melena, or hematemesis Neurologic: negative for visual changes, syncope, or dizziness All other systems reviewed and are otherwise negative except as noted above.    Blood pressure 140/80, pulse 67, height 5\' 4"  (1.626 m), weight 196 lb (88.9 kg).  General appearance: alert and no distress Neck: no adenopathy, no carotid bruit, no JVD, supple, symmetrical, trachea midline and thyroid not enlarged, symmetric, no tenderness/mass/nodules Lungs: clear to auscultation bilaterally Heart: regular rate and rhythm, S1, S2 normal,  no murmur, click, rub or gallop Extremities: extremities normal, atraumatic, no cyanosis or edema  EKG normal sinus rhythm at 67 without ST or T-wave changes. There was reverse R-wave progression. I personally reviewed this EKG  ASSESSMENT AND PLAN:   Essential hypertension History of hypertension with blood pressure measured at 140/80. She is on atenolol. Continue current meds are current dosing  Hyperlipidemia History of hyperlipidemia on statin therapy followed by her  PCP      Lorretta Harp MD [facility], [facility] 07/27/2016 9:29 AM

## 2016-09-10 DIAGNOSIS — Z124 Encounter for screening for malignant neoplasm of cervix: Secondary | ICD-10-CM | POA: Diagnosis not present

## 2016-09-10 DIAGNOSIS — N3281 Overactive bladder: Secondary | ICD-10-CM | POA: Diagnosis not present

## 2016-09-10 DIAGNOSIS — Z01419 Encounter for gynecological examination (general) (routine) without abnormal findings: Secondary | ICD-10-CM | POA: Diagnosis not present

## 2016-09-10 DIAGNOSIS — R399 Unspecified symptoms and signs involving the genitourinary system: Secondary | ICD-10-CM | POA: Diagnosis not present

## 2016-09-20 ENCOUNTER — Other Ambulatory Visit: Payer: Self-pay | Admitting: Internal Medicine

## 2016-09-20 ENCOUNTER — Ambulatory Visit
Admission: RE | Admit: 2016-09-20 | Discharge: 2016-09-20 | Disposition: A | Discharge status: Home / Self Care | Payer: Medicare Other | Source: Ambulatory Visit | Attending: Internal Medicine | Admitting: Internal Medicine

## 2016-09-20 DIAGNOSIS — R140 Abdominal distension (gaseous): Secondary | ICD-10-CM | POA: Diagnosis not present

## 2016-09-20 DIAGNOSIS — R109 Unspecified abdominal pain: Secondary | ICD-10-CM | POA: Diagnosis not present

## 2016-09-20 DIAGNOSIS — E1122 Type 2 diabetes mellitus with diabetic chronic kidney disease: Secondary | ICD-10-CM | POA: Diagnosis not present

## 2016-09-20 DIAGNOSIS — R7301 Impaired fasting glucose: Secondary | ICD-10-CM | POA: Diagnosis not present

## 2016-09-20 DIAGNOSIS — R110 Nausea: Secondary | ICD-10-CM | POA: Diagnosis not present

## 2016-09-26 DIAGNOSIS — Z803 Family history of malignant neoplasm of breast: Secondary | ICD-10-CM | POA: Diagnosis not present

## 2016-09-26 DIAGNOSIS — Z1231 Encounter for screening mammogram for malignant neoplasm of breast: Secondary | ICD-10-CM | POA: Diagnosis not present

## 2016-10-01 HISTORY — PX: CARPAL TUNNEL RELEASE: SHX101

## 2016-11-21 DIAGNOSIS — J069 Acute upper respiratory infection, unspecified: Secondary | ICD-10-CM | POA: Diagnosis not present

## 2016-12-31 DIAGNOSIS — F419 Anxiety disorder, unspecified: Secondary | ICD-10-CM | POA: Diagnosis not present

## 2016-12-31 DIAGNOSIS — E1122 Type 2 diabetes mellitus with diabetic chronic kidney disease: Secondary | ICD-10-CM | POA: Diagnosis not present

## 2016-12-31 DIAGNOSIS — N183 Chronic kidney disease, stage 3 (moderate): Secondary | ICD-10-CM | POA: Diagnosis not present

## 2016-12-31 DIAGNOSIS — I10 Essential (primary) hypertension: Secondary | ICD-10-CM | POA: Diagnosis not present

## 2016-12-31 DIAGNOSIS — E7800 Pure hypercholesterolemia, unspecified: Secondary | ICD-10-CM | POA: Diagnosis not present

## 2017-01-30 DIAGNOSIS — M25562 Pain in left knee: Secondary | ICD-10-CM | POA: Diagnosis not present

## 2017-01-30 DIAGNOSIS — G8929 Other chronic pain: Secondary | ICD-10-CM | POA: Diagnosis not present

## 2017-01-30 DIAGNOSIS — M25561 Pain in right knee: Secondary | ICD-10-CM | POA: Diagnosis not present

## 2017-02-06 DIAGNOSIS — D225 Melanocytic nevi of trunk: Secondary | ICD-10-CM | POA: Diagnosis not present

## 2017-02-06 DIAGNOSIS — C44519 Basal cell carcinoma of skin of other part of trunk: Secondary | ICD-10-CM | POA: Diagnosis not present

## 2017-02-06 DIAGNOSIS — L814 Other melanin hyperpigmentation: Secondary | ICD-10-CM | POA: Diagnosis not present

## 2017-02-06 DIAGNOSIS — L821 Other seborrheic keratosis: Secondary | ICD-10-CM | POA: Diagnosis not present

## 2017-02-06 DIAGNOSIS — L738 Other specified follicular disorders: Secondary | ICD-10-CM | POA: Diagnosis not present

## 2017-02-28 DIAGNOSIS — M25562 Pain in left knee: Secondary | ICD-10-CM | POA: Diagnosis not present

## 2017-02-28 DIAGNOSIS — G8929 Other chronic pain: Secondary | ICD-10-CM | POA: Diagnosis not present

## 2017-02-28 DIAGNOSIS — M25561 Pain in right knee: Secondary | ICD-10-CM | POA: Diagnosis not present

## 2017-03-05 DIAGNOSIS — H2513 Age-related nuclear cataract, bilateral: Secondary | ICD-10-CM | POA: Diagnosis not present

## 2017-03-05 DIAGNOSIS — H40003 Preglaucoma, unspecified, bilateral: Secondary | ICD-10-CM | POA: Diagnosis not present

## 2017-03-05 DIAGNOSIS — E119 Type 2 diabetes mellitus without complications: Secondary | ICD-10-CM | POA: Diagnosis not present

## 2017-03-05 DIAGNOSIS — H40053 Ocular hypertension, bilateral: Secondary | ICD-10-CM | POA: Diagnosis not present

## 2017-03-05 DIAGNOSIS — H52223 Regular astigmatism, bilateral: Secondary | ICD-10-CM | POA: Diagnosis not present

## 2017-03-05 DIAGNOSIS — H5203 Hypermetropia, bilateral: Secondary | ICD-10-CM | POA: Diagnosis not present

## 2017-03-05 DIAGNOSIS — H524 Presbyopia: Secondary | ICD-10-CM | POA: Diagnosis not present

## 2017-03-11 DIAGNOSIS — C44519 Basal cell carcinoma of skin of other part of trunk: Secondary | ICD-10-CM | POA: Diagnosis not present

## 2017-04-12 DIAGNOSIS — M25561 Pain in right knee: Secondary | ICD-10-CM | POA: Diagnosis not present

## 2017-04-12 DIAGNOSIS — G8929 Other chronic pain: Secondary | ICD-10-CM | POA: Diagnosis not present

## 2017-04-17 DIAGNOSIS — S83241A Other tear of medial meniscus, current injury, right knee, initial encounter: Secondary | ICD-10-CM | POA: Diagnosis not present

## 2017-04-17 DIAGNOSIS — G8929 Other chronic pain: Secondary | ICD-10-CM | POA: Diagnosis not present

## 2017-04-17 DIAGNOSIS — M1711 Unilateral primary osteoarthritis, right knee: Secondary | ICD-10-CM | POA: Diagnosis not present

## 2017-04-17 DIAGNOSIS — M25562 Pain in left knee: Secondary | ICD-10-CM | POA: Diagnosis not present

## 2017-04-26 DIAGNOSIS — M1711 Unilateral primary osteoarthritis, right knee: Secondary | ICD-10-CM | POA: Diagnosis not present

## 2017-04-26 DIAGNOSIS — G8929 Other chronic pain: Secondary | ICD-10-CM | POA: Diagnosis not present

## 2017-04-26 DIAGNOSIS — H16143 Punctate keratitis, bilateral: Secondary | ICD-10-CM | POA: Diagnosis not present

## 2017-04-26 DIAGNOSIS — M25562 Pain in left knee: Secondary | ICD-10-CM | POA: Diagnosis not present

## 2017-05-03 ENCOUNTER — Encounter: Payer: Self-pay | Admitting: Cardiovascular Disease

## 2017-05-03 ENCOUNTER — Ambulatory Visit (INDEPENDENT_AMBULATORY_CARE_PROVIDER_SITE_OTHER): Payer: Medicare Other | Admitting: Cardiovascular Disease

## 2017-05-03 VITALS — BP 168/90 | HR 69 | Ht 64.0 in | Wt 194.0 lb

## 2017-05-03 DIAGNOSIS — I10 Essential (primary) hypertension: Secondary | ICD-10-CM | POA: Diagnosis not present

## 2017-05-03 DIAGNOSIS — E7800 Pure hypercholesterolemia, unspecified: Secondary | ICD-10-CM

## 2017-05-03 NOTE — Patient Instructions (Signed)
Medication Instructions: Your physician recommends that you continue on your current medications as directed. Please refer to the Current Medication list given to you today.  Follow-Up: Your physician wants you to follow-up in: 1 year with Dr. Gwenlyn Found. You will receive a reminder letter in the mail two months in advance. If you don't receive a letter, please call our office to schedule the follow-up appointment.   Any Other Special Instructions will be listed below:  You have been cleared at low risk for your knee replacement with Dr. Alvan Dame.   If you need a refill on your cardiac medications before your next appointment, please call your pharmacy.

## 2017-05-03 NOTE — Assessment & Plan Note (Signed)
History of essential hypertension with blood pressure measured today at 116/90. She is somewhat nervous today. She is on Maxzide and atenolol. Continue current meds at current dosing

## 2017-05-03 NOTE — Assessment & Plan Note (Signed)
History of hyperlipidemia on statin therapy followed by her PCP. 

## 2017-05-03 NOTE — Progress Notes (Signed)
05/03/2017 Desiree Rasmussen   07-12-1941  [id number]  Primary Physician Desiree Carol, MD Primary Cardiologist: Desiree Harp MD Desiree Rasmussen, [place]  HPI:  Desiree Rasmussen is a 76 y.o. female , mildly overweight, married Caucasian female, mother of two, grandmother to two grandchildren, whose husband , Desiree Rasmussen, unfortunately passed away on 17-Nov-2013. He was a patient of mine as well. He apparently died of complications of pneumonia. I last saw her 12 months ago. I last saw her in the office 07/27/16. She has a history of hypertension and hyperlipidemia, as well as a family history of heart disease. She had chest pain and ruled out myocardial infarction on an ER visit earlier last year, and when I saw her she was complaining of atypical chest pain. A Myoview stress test performed in our office on May 01, 2012, was entirely normal. I thought her pain was more musculoskeletal and/or radicular and her symptoms subsequently resolved. Since I saw her a year ago she's remained fairly stable and is asymptomatic. She apparently needs a right total knee replacement by Dr. Alvan Rasmussen 06/11/17.   Current Meds  Medication Sig  . atenolol (TENORMIN) 25 MG tablet Take 25 mg by mouth Daily.   . clorazepate (TRANXENE) 7.5 MG tablet Take 7.5 mg by mouth daily.  . Diaper Rash Products (DESITIN) OINT Apply 1 application topically as needed.  . meloxicam (MOBIC) 7.5 MG tablet Take 7.5 mg by mouth Daily.   . simvastatin (ZOCOR) 40 MG tablet Take 40 mg by mouth Daily.   Marland Kitchen SYNTHROID 100 MCG tablet Take 100 mcg by mouth Daily.   Marland Kitchen triamterene-hydrochlorothiazide (MAXZIDE-25) 37.5-25 MG per tablet Take 0.5 tablets by mouth daily.      Allergies  Allergen Reactions  . Azithromycin Nausea Only and Other (See Comments)    Caused stomach cramping. Anything in Mycin family caused reaction a long time ago.  . Ibuprofen Nausea And Vomiting  . Sulfur Rash    All over the body    Social History   Social  History  . Marital status: Married    Spouse name: N/A  . Number of children: 2  . Years of education: N/A   Occupational History  . Not on file.   Social History Main Topics  . Smoking status: Never Smoker  . Smokeless tobacco: Never Used  . Alcohol use No  . Drug use: No  . Sexual activity: Not on file   Other Topics Concern  . Not on file   Social History Narrative  . No narrative on file     Review of Systems: General: negative for chills, fever, night sweats or weight changes.  Cardiovascular: negative for chest pain, dyspnea on exertion, edema, orthopnea, palpitations, paroxysmal nocturnal dyspnea or shortness of breath Dermatological: negative for rash Respiratory: negative for cough or wheezing Urologic: negative for hematuria Abdominal: negative for nausea, vomiting, diarrhea, bright red blood per rectum, melena, or hematemesis Neurologic: negative for visual changes, syncope, or dizziness All other systems reviewed and are otherwise negative except as noted above.    Blood pressure (!) 168/90, pulse 69, height 5\' 4"  (1.626 m), weight 194 lb (88 kg).  General appearance: alert and no distress Neck: no adenopathy, no carotid bruit, no JVD, supple, symmetrical, trachea midline and thyroid not enlarged, symmetric, no tenderness/mass/nodules Lungs: clear to auscultation bilaterally Heart: regular rate and rhythm, S1, S2 normal, no murmur, click, rub or gallop Extremities: extremities normal, atraumatic, no cyanosis or edema  EKG Sinus rhythm at 69 with nonspecific ST and T-wave changes. I personally reviewed this EKG.  ASSESSMENT AND PLAN:   Essential hypertension History of essential hypertension with blood pressure measured today at 116/90. She is somewhat nervous today. She is on Maxzide and atenolol. Continue current meds at current dosing  Hyperlipidemia History of hyperlipidemia on statin therapy followed by her PCP      Desiree Harp MD  [facility], [facility] 05/03/2017 9:15 AM

## 2017-05-10 DIAGNOSIS — M25561 Pain in right knee: Secondary | ICD-10-CM | POA: Diagnosis not present

## 2017-05-10 DIAGNOSIS — E1122 Type 2 diabetes mellitus with diabetic chronic kidney disease: Secondary | ICD-10-CM | POA: Diagnosis not present

## 2017-05-10 DIAGNOSIS — I10 Essential (primary) hypertension: Secondary | ICD-10-CM | POA: Diagnosis not present

## 2017-05-10 DIAGNOSIS — E7800 Pure hypercholesterolemia, unspecified: Secondary | ICD-10-CM | POA: Diagnosis not present

## 2017-05-10 DIAGNOSIS — F329 Major depressive disorder, single episode, unspecified: Secondary | ICD-10-CM | POA: Diagnosis not present

## 2017-05-10 DIAGNOSIS — N183 Chronic kidney disease, stage 3 (moderate): Secondary | ICD-10-CM | POA: Diagnosis not present

## 2017-05-10 DIAGNOSIS — F419 Anxiety disorder, unspecified: Secondary | ICD-10-CM | POA: Diagnosis not present

## 2017-05-14 NOTE — Progress Notes (Signed)
Need orders for 9-11 surgery in epic

## 2017-05-16 DIAGNOSIS — M1711 Unilateral primary osteoarthritis, right knee: Secondary | ICD-10-CM | POA: Diagnosis not present

## 2017-05-30 NOTE — H&P (Signed)
TOTAL KNEE ADMISSION H&P  Patient is being admitted for right total knee arthroplasty.  Subjective:  Chief Complaint:     Right knee primary OA / pain  HPI: Desiree Rasmussen, 76 y.o. female, has a history of pain and functional disability in the right knee due to arthritis and has failed non-surgical conservative treatments for greater than 12 weeks to include NSAID's and/or analgesics, corticosteriod injections and activity modification.  Onset of symptoms was gradual, starting  years ago with gradually worsening course since that time. The patient noted no past surgery on the right knee(s).  Patient currently rates pain in the right knee(s) at 8 out of 10 with activity. Patient has night pain, worsening of pain with activity and weight bearing, pain that interferes with activities of daily living, pain with passive range of motion, crepitus and joint swelling.  Patient has evidence of periarticular osteophytes and joint space narrowing by imaging studies.  There is no active infection.  Risks, benefits and expectations were discussed with the patient.  Risks including but not limited to the risk of anesthesia, blood clots, nerve damage, blood vessel damage, failure of the prosthesis, infection and up to and including death.  Patient understand the risks, benefits and expectations and wishes to proceed with surgery.   PCP: Seward Carol, MD  D/C Plans:       Home   Post-op Meds:       No Rx given  Tranexamic Acid:      To be given - IV   Decadron:      Is to be given  FYI:     ASA  Norco  DME:   Rx given for - RW and 3-n-1  PT:   Virtual PT   Patient Active Problem List   Diagnosis Date Noted  . Essential hypertension 07/20/2013  . Hyperlipidemia 07/20/2013  . Infected sebaceous cyst 09/28/2011   Past Medical History:  Diagnosis Date  . Arthritis   . Cyst    right breast  . Family history of heart disease   . History of nuclear stress test 05/2012   normal lexiscan myoview   . Hyperlipidemia   . Hypertension     Past Surgical History:  Procedure Laterality Date  . [place]  . CYSTECTOMY     sebaceous cyst on breast    No prescriptions prior to admission.   Allergies  Allergen Reactions  . Azithromycin Nausea Only and Other (See Comments)    Caused stomach cramping. Anything in Mycin family caused reaction a long time ago.  . Ibuprofen Nausea And Vomiting  . Sulfur Rash    All over the body    Social History  Substance Use Topics  . Smoking status: Never Smoker  . Smokeless tobacco: Never Used  . Alcohol use No    Family History  Problem Relation Age of Onset  . Colon cancer Mother 75  . Heart disease Father 14       heart attack  . Breast cancer Maternal Aunt 75  . Colon cancer Maternal Uncle   . Heart disease Brother        CABG at 4     Review of Systems  Constitutional: Negative.   HENT: Negative.   Eyes: Negative.   Respiratory: Negative.   Cardiovascular: Negative.   Gastrointestinal: Positive for heartburn.  Genitourinary: Positive for frequency.  Musculoskeletal: Positive for joint pain.  Skin: Negative.   Neurological: Positive for dizziness.  Endo/Heme/Allergies: Negative.  Psychiatric/Behavioral: Positive for depression.    Objective:  Physical Exam  Constitutional: She is oriented to person, place, and time. She appears well-developed.  HENT:  Head: Normocephalic.  Mouth/Throat: She has dentures.  Eyes: Pupils are equal, round, and reactive to light.  Neck: Neck supple. No JVD present. No tracheal deviation present. No thyromegaly present.  Cardiovascular: Normal rate, regular rhythm and intact distal pulses.   Respiratory: Effort normal and breath sounds normal. No respiratory distress. She has no wheezes.  GI: Soft. There is no tenderness. There is no guarding.  Musculoskeletal:       Right knee: She exhibits decreased range of motion, swelling and bony tenderness. She exhibits no  ecchymosis, no deformity, no laceration and no erythema. Tenderness found.  Lymphadenopathy:    She has no cervical adenopathy.  Neurological: She is alert and oriented to person, place, and time.  Skin: Skin is warm and dry.  Psychiatric: She has a normal mood and affect.     Labs:   Estimated body mass index is 33.3 kg/m as calculated from the following:   Height as of 05/03/17: 5\' 4"  (1.626 m).   Weight as of 05/03/17: 88 kg (194 lb).   Imaging Review Plain radiographs demonstrate severe degenerative joint disease of the right knee(s). The overall alignment is neutral. The bone quality appears to be good for age and reported activity level.  Assessment/Plan:  End stage arthritis, right knee   The patient history, physical examination, clinical judgment of the provider and imaging studies are consistent with end stage degenerative joint disease of the right knee(s) and total knee arthroplasty is deemed medically necessary. The treatment options including medical management, injection therapy arthroscopy and arthroplasty were discussed at length. The risks and benefits of total knee arthroplasty were presented and reviewed. The risks due to aseptic loosening, infection, stiffness, patella tracking problems, thromboembolic complications and other imponderables were discussed. The patient acknowledged the explanation, agreed to proceed with the plan and consent was signed. Patient is being admitted for inpatient treatment for surgery, pain control, PT, OT, prophylactic antibiotics, VTE prophylaxis, progressive ambulation and ADL's and discharge planning. The patient is planning to be discharged home.    [name]   PA-C  05/30/2017, 1:15 PM

## 2017-06-04 NOTE — Patient Instructions (Addendum)
Desiree Rasmussen  06/04/2017   Your procedure is scheduled on: 06/11/2017    Report to [facility] Main  Entrance   Report to admitting at   0900 AM   Call this number if you have problems the morning of surgery  [phone]   Remember: ONLY 1 PERSON MAY GO WITH YOU TO SHORT STAY TO GET  READY MORNING OF YOUR SURGERY.  Do not eat food or drink liquids :After Midnight.     Take these medicines the morning of surgery with A SIP OF WATER: Atenolol ( Tenormin), Lexapro, Synthroid                                You may not have any metal on your body including hair pins and              piercings  Do not wear jewelry, make-up, lotions, powders or perfumes, deodorant             Do not wear nail polish.  Do not shave  48 hours prior to surgery.                Do not bring valuables to the hospital. [place].  Contacts, dentures or bridgework may not be worn into surgery.  Leave suitcase in the car. After surgery it may be brought to your room.                     Please read over the following fact sheets you were given: _____________________________________________________________________             [facility] - Preparing for Surgery Before surgery, you can play an important role.  Because skin is not sterile, your skin needs to be as free of germs as possible.  You can reduce the number of germs on your skin by washing with CHG (chlorahexidine gluconate) soap before surgery.  CHG is an antiseptic cleaner which kills germs and bonds with the skin to continue killing germs even after washing. Please DO NOT use if you have an allergy to CHG or antibacterial soaps.  If your skin becomes reddened/irritated stop using the CHG and inform your nurse when you arrive at Short Stay. Do not shave (including legs and underarms) for at least 48 hours prior to the first CHG shower.  You may shave your face/neck. Please  follow these instructions carefully:  1.  Shower with CHG Soap the night before surgery and the  morning of Surgery.  2.  If you choose to wash your hair, wash your hair first as usual with your  normal  shampoo.  3.  After you shampoo, rinse your hair and body thoroughly to remove the  shampoo.                           4.  Use CHG as you would any other liquid soap.  You can apply chg directly  to the skin and wash                       Gently with a scrungie or clean washcloth.  5.  Apply the CHG  Soap to your body ONLY FROM THE NECK DOWN.   Do not use on face/ open                           Wound or open sores. Avoid contact with eyes, ears mouth and genitals (private parts).                       Wash face,  Genitals (private parts) with your normal soap.             6.  Wash thoroughly, paying special attention to the area where your surgery  will be performed.  7.  Thoroughly rinse your body with warm water from the neck down.  8.  DO NOT shower/wash with your normal soap after using and rinsing off  the CHG Soap.                9.  Pat yourself dry with a clean towel.            10.  Wear clean pajamas.            11.  Place clean sheets on your bed the night of your first shower and do not  sleep with pets. Day of Surgery : Do not apply any lotions/deodorants the morning of surgery.  Please wear clean clothes to the hospital/surgery center.  FAILURE TO FOLLOW THESE INSTRUCTIONS MAY RESULT IN THE CANCELLATION OF YOUR SURGERY PATIENT SIGNATURE_________________________________  NURSE SIGNATURE__________________________________  ________________________________________________________________________  WHAT IS A BLOOD TRANSFUSION? Blood Transfusion Information  A transfusion is the replacement of blood or some of its parts. Blood is made up of multiple cells which provide different functions.  Red blood cells carry oxygen and are used for blood loss replacement.  White blood cells  fight against infection.  Platelets control bleeding.  Plasma helps clot blood.  Other blood products are available for specialized needs, such as hemophilia or other clotting disorders. BEFORE THE TRANSFUSION  Who gives blood for transfusions?   Healthy volunteers who are fully evaluated to make sure their blood is safe. This is blood bank blood. Transfusion therapy is the safest it has ever been in the practice of medicine. Before blood is taken from a donor, a complete history is taken to make sure that person has no history of diseases nor engages in risky social behavior (examples are intravenous drug use or sexual activity with multiple partners). The donor's travel history is screened to minimize risk of transmitting infections, such as malaria. The donated blood is tested for signs of infectious diseases, such as HIV and hepatitis. The blood is then tested to be sure it is compatible with you in order to minimize the chance of a transfusion reaction. If you or a relative donates blood, this is often done in anticipation of surgery and is not appropriate for emergency situations. It takes many days to process the donated blood. RISKS AND COMPLICATIONS Although transfusion therapy is very safe and saves many lives, the main dangers of transfusion include:   Getting an infectious disease.  Developing a transfusion reaction. This is an allergic reaction to something in the blood you were given. Every precaution is taken to prevent this. The decision to have a blood transfusion has been considered carefully by your caregiver before blood is given. Blood is not given unless the benefits outweigh the risks. AFTER THE TRANSFUSION  Right after receiving a blood transfusion, you  will usually feel much better and more energetic. This is especially true if your red blood cells have gotten low (anemic). The transfusion raises the level of the red blood cells which carry oxygen, and this usually  causes an energy increase.  The nurse administering the transfusion will monitor you carefully for complications. HOME CARE INSTRUCTIONS  No special instructions are needed after a transfusion. You may find your energy is better. Speak with your caregiver about any limitations on activity for underlying diseases you may have. SEEK MEDICAL CARE IF:   Your condition is not improving after your transfusion.  You develop redness or irritation at the intravenous (IV) site. SEEK IMMEDIATE MEDICAL CARE IF:  Any of the following symptoms occur over the next 12 hours:  Shaking chills.  You have a temperature by mouth above 102 F (38.9 C), not controlled by medicine.  Chest, back, or muscle pain.  People around you feel you are not acting correctly or are confused.  Shortness of breath or difficulty breathing.  Dizziness and fainting.  You get a rash or develop hives.  You have a decrease in urine output.  Your urine turns a dark color or changes to pink, red, or brown. Any of the following symptoms occur over the next 10 days:  You have a temperature by mouth above 102 F (38.9 C), not controlled by medicine.  Shortness of breath.  Weakness after normal activity.  The white part of the eye turns yellow (jaundice).  You have a decrease in the amount of urine or are urinating less often.  Your urine turns a dark color or changes to pink, red, or brown. Document Released: 09/14/2000 Document Revised: 12/10/2011 Document Reviewed: 05/03/2008 ExitCare Patient Information 2014 [place].  _______________________________________________________________________  Incentive Spirometer  An incentive spirometer is a tool that can help keep your lungs clear and active. This tool measures how well you are filling your lungs with each breath. Taking long deep breaths may help reverse or decrease the chance of developing breathing (pulmonary) problems (especially infection)  following:  A long period of time when you are unable to move or be active. BEFORE THE PROCEDURE   If the spirometer includes an indicator to show your best effort, your nurse or respiratory therapist will set it to a desired goal.  If possible, sit up straight or lean slightly forward. Try not to slouch.  Hold the incentive spirometer in an upright position. INSTRUCTIONS FOR USE  1. Sit on the edge of your bed if possible, or sit up as far as you can in bed or on a chair. 2. Hold the incentive spirometer in an upright position. 3. Breathe out normally. 4. Place the mouthpiece in your mouth and seal your lips tightly around it. 5. Breathe in slowly and as deeply as possible, raising the piston or the ball toward the top of the column. 6. Hold your breath for 3-5 seconds or for as long as possible. Allow the piston or ball to fall to the bottom of the column. 7. Remove the mouthpiece from your mouth and breathe out normally. 8. Rest for a few seconds and repeat Steps 1 through 7 at least 10 times every 1-2 hours when you are awake. Take your time and take a few normal breaths between deep breaths. 9. The spirometer may include an indicator to show your best effort. Use the indicator as a goal to work toward during each repetition. 10. After each set of 10 deep breaths, practice  coughing to be sure your lungs are clear. If you have an incision (the cut made at the time of surgery), support your incision when coughing by placing a pillow or rolled up towels firmly against it. Once you are able to get out of bed, walk around indoors and cough well. You may stop using the incentive spirometer when instructed by your caregiver.  RISKS AND COMPLICATIONS  Take your time so you do not get dizzy or light-headed.  If you are in pain, you may need to take or ask for pain medication before doing incentive spirometry. It is harder to take a deep breath if you are having pain. AFTER USE  Rest and  breathe slowly and easily.  It can be helpful to keep track of a log of your progress. Your caregiver can provide you with a simple table to help with this. If you are using the spirometer at home, follow these instructions: [place] IF:   You are having difficultly using the spirometer.  You have trouble using the spirometer as often as instructed.  Your pain medication is not giving enough relief while using the spirometer.  You develop fever of 100.5 F (38.1 C) or higher. SEEK IMMEDIATE MEDICAL CARE IF:   You cough up bloody sputum that had not been present before.  You develop fever of 102 F (38.9 C) or greater.  You develop worsening pain at or near the incision site. MAKE SURE YOU:   Understand these instructions.  Will watch your condition.  Will get help right away if you are not doing well or get worse. Document Released: 01/28/2007 Document Revised: 12/10/2011 Document Reviewed: 03/31/2007 [facility] Patient Information 2014 [place], [place].   ________________________________________________________________________

## 2017-06-05 ENCOUNTER — Encounter (HOSPITAL_COMMUNITY)
Admission: RE | Admit: 2017-06-05 | Discharge: 2017-06-05 | Disposition: A | Discharge status: Home / Self Care | Payer: Medicare Other | Source: Ambulatory Visit | Attending: Orthopedic Surgery | Admitting: Orthopedic Surgery

## 2017-06-05 ENCOUNTER — Encounter (HOSPITAL_COMMUNITY): Payer: Self-pay

## 2017-06-05 DIAGNOSIS — Z01818 Encounter for other preprocedural examination: Secondary | ICD-10-CM | POA: Diagnosis not present

## 2017-06-05 DIAGNOSIS — M1711 Unilateral primary osteoarthritis, right knee: Secondary | ICD-10-CM | POA: Diagnosis not present

## 2017-06-05 HISTORY — DX: Depression, unspecified: F32.A

## 2017-06-05 HISTORY — DX: Malignant (primary) neoplasm, unspecified: C80.1

## 2017-06-05 HISTORY — DX: Major depressive disorder, single episode, unspecified: F32.9

## 2017-06-05 LAB — BASIC METABOLIC PANEL
Anion gap: 7 (ref 5–15)
BUN: 19 mg/dL (ref 6–20)
CHLORIDE: 102 mmol/L (ref 101–111)
CO2: 29 mmol/L (ref 22–32)
CREATININE: 0.94 mg/dL (ref 0.44–1.00)
Calcium: 9.6 mg/dL (ref 8.9–10.3)
GFR calc Af Amer: >60 mL/min (ref >60)
GFR calc non Af Amer: 58 mL/min — ABNORMAL LOW (ref >60)
GLUCOSE: 107 mg/dL — AB (ref 65–99)
POTASSIUM: 3.9 mmol/L (ref 3.5–5.1)
Sodium: 138 mmol/L (ref 135–145)

## 2017-06-05 LAB — CBC
HEMATOCRIT: 41.9 % (ref 36.0–46.0)
Hemoglobin: 14.1 g/dL (ref 12.0–15.0)
MCH: 31.2 pg (ref 26.0–34.0)
MCHC: 33.7 g/dL (ref 30.0–36.0)
MCV: 92.7 fL (ref 78.0–100.0)
PLATELETS: 169 10*3/uL (ref 150–400)
RBC: 4.52 MIL/uL (ref 3.87–5.11)
RDW: 12.9 % (ref 11.5–15.5)
WBC: 5.6 10*3/uL (ref 4.0–10.5)

## 2017-06-05 LAB — SURGICAL PCR SCREEN
MRSA, PCR: NEGATIVE
Staphylococcus aureus: NEGATIVE

## 2017-06-05 LAB — ABO/RH: ABO/RH(D): O POS

## 2017-06-05 NOTE — Progress Notes (Signed)
Patient started on Lexapro approximately one month ago per Dr Delfina Redwood.  Patient reports having diarrhea issues since started on Lexapro.  Informed patient that diarrhea was side effect of Lexapro and to contact Dr Delfina Redwood prior to surgery to make sure it was Lexapro or determine cause and treat prior to surgery.  Patient voiced understanding.

## 2017-06-05 NOTE — Progress Notes (Signed)
05/15/17- clearance- dr Delfina Redwood on chart  05/03/17- LOV- cardiology- epic  05/03/17-ekg-epic 05/10/17-hgba1c-6.9-Dr Delfina Redwood on chart

## 2017-06-07 DIAGNOSIS — R197 Diarrhea, unspecified: Secondary | ICD-10-CM | POA: Diagnosis not present

## 2017-06-07 DIAGNOSIS — F419 Anxiety disorder, unspecified: Secondary | ICD-10-CM | POA: Diagnosis not present

## 2017-06-11 ENCOUNTER — Inpatient Hospital Stay (HOSPITAL_COMMUNITY): Payer: Medicare Other | Admitting: Registered Nurse

## 2017-06-11 ENCOUNTER — Inpatient Hospital Stay (HOSPITAL_COMMUNITY)
Admission: RE | Admit: 2017-06-11 | Discharge: 2017-06-13 | DRG: 470 | Disposition: A | Discharge status: Home / Self Care | Payer: Medicare Other | Source: Ambulatory Visit | Attending: Orthopedic Surgery | Admitting: Orthopedic Surgery

## 2017-06-11 ENCOUNTER — Encounter (HOSPITAL_COMMUNITY): Payer: Self-pay

## 2017-06-11 ENCOUNTER — Encounter (HOSPITAL_COMMUNITY)
Admission: RE | Disposition: A | Discharge status: Home / Self Care | Payer: Self-pay | Source: Ambulatory Visit | Attending: Orthopedic Surgery

## 2017-06-11 DIAGNOSIS — Z85828 Personal history of other malignant neoplasm of skin: Secondary | ICD-10-CM

## 2017-06-11 DIAGNOSIS — Z96659 Presence of unspecified artificial knee joint: Secondary | ICD-10-CM

## 2017-06-11 DIAGNOSIS — E785 Hyperlipidemia, unspecified: Secondary | ICD-10-CM | POA: Diagnosis present

## 2017-06-11 DIAGNOSIS — E669 Obesity, unspecified: Secondary | ICD-10-CM | POA: Diagnosis present

## 2017-06-11 DIAGNOSIS — Z6833 Body mass index (BMI) 33.0-33.9, adult: Secondary | ICD-10-CM | POA: Diagnosis not present

## 2017-06-11 DIAGNOSIS — R7303 Prediabetes: Secondary | ICD-10-CM | POA: Diagnosis present

## 2017-06-11 DIAGNOSIS — E039 Hypothyroidism, unspecified: Secondary | ICD-10-CM | POA: Diagnosis present

## 2017-06-11 DIAGNOSIS — M1711 Unilateral primary osteoarthritis, right knee: Principal | ICD-10-CM | POA: Diagnosis present

## 2017-06-11 DIAGNOSIS — N189 Chronic kidney disease, unspecified: Secondary | ICD-10-CM | POA: Diagnosis present

## 2017-06-11 DIAGNOSIS — I129 Hypertensive chronic kidney disease with stage 1 through stage 4 chronic kidney disease, or unspecified chronic kidney disease: Secondary | ICD-10-CM | POA: Diagnosis present

## 2017-06-11 DIAGNOSIS — M25561 Pain in right knee: Secondary | ICD-10-CM | POA: Diagnosis not present

## 2017-06-11 DIAGNOSIS — I10 Essential (primary) hypertension: Secondary | ICD-10-CM | POA: Diagnosis not present

## 2017-06-11 DIAGNOSIS — Z96651 Presence of right artificial knee joint: Secondary | ICD-10-CM

## 2017-06-11 DIAGNOSIS — G8918 Other acute postprocedural pain: Secondary | ICD-10-CM | POA: Diagnosis not present

## 2017-06-11 HISTORY — PX: TOTAL KNEE ARTHROPLASTY: SHX125

## 2017-06-11 LAB — TYPE AND SCREEN
ABO/RH(D): O POS
Antibody Screen: NEGATIVE

## 2017-06-11 SURGERY — ARTHROPLASTY, KNEE, TOTAL
Anesthesia: Spinal | Site: Knee | Laterality: Right

## 2017-06-11 MED ORDER — MENTHOL 3 MG MT LOZG: 1.0000 | LOZENGE | OROMUCOSAL | Status: DC | PRN

## 2017-06-11 MED ORDER — DOCUSATE SODIUM 100 MG PO CAPS: 100.0000 mg | ORAL_CAPSULE | Freq: Two times a day (BID) | ORAL | 0 refills | Status: DC

## 2017-06-11 MED ORDER — PROPOFOL 10 MG/ML IV BOLUS
INTRAVENOUS | Status: AC
  Filled 2017-06-11: qty 40

## 2017-06-11 MED ORDER — DEXAMETHASONE SODIUM PHOSPHATE 10 MG/ML IJ SOLN
10.0000 mg | Freq: Once | INTRAMUSCULAR | Status: AC
  Administered 2017-06-11: 10 mg via INTRAVENOUS

## 2017-06-11 MED ORDER — PROPOFOL 500 MG/50ML IV EMUL
INTRAVENOUS | Status: DC | PRN
  Administered 2017-06-11: 40 ug/kg/min via INTRAVENOUS

## 2017-06-11 MED ORDER — PHENOL 1.4 % MT LIQD
1.0000 | OROMUCOSAL | Status: DC | PRN
Start: 2017-06-11 — End: 2017-06-13

## 2017-06-11 MED ORDER — CHLORHEXIDINE GLUCONATE 4 % EX LIQD: 60.0000 mL | Freq: Once | CUTANEOUS | Status: DC

## 2017-06-11 MED ORDER — FERROUS SULFATE 325 (65 FE) MG PO TABS
325.0000 mg | ORAL_TABLET | Freq: Three times a day (TID) | ORAL | 3 refills | Status: DC
Start: 2017-06-11 — End: 2018-01-06

## 2017-06-11 MED ORDER — METHOCARBAMOL 500 MG PO TABS
500.0000 mg | ORAL_TABLET | Freq: Four times a day (QID) | ORAL | Status: DC | PRN
  Administered 2017-06-11 – 2017-06-12 (×2): 500 mg via ORAL
  Filled 2017-06-11 (×2): qty 1

## 2017-06-11 MED ORDER — ONDANSETRON HCL 4 MG PO TABS: 4.0000 mg | ORAL_TABLET | Freq: Four times a day (QID) | ORAL | Status: DC | PRN

## 2017-06-11 MED ORDER — LIDOCAINE 2% (20 MG/ML) 5 ML SYRINGE
INTRAMUSCULAR | Status: DC | PRN
  Administered 2017-06-11: 100 mg via INTRAVENOUS

## 2017-06-11 MED ORDER — SIMVASTATIN 20 MG PO TABS
20.0000 mg | ORAL_TABLET | Freq: Every day | ORAL | Status: DC
  Administered 2017-06-11 – 2017-06-13 (×3): 20 mg via ORAL
  Filled 2017-06-11 (×3): qty 1

## 2017-06-11 MED ORDER — SODIUM CHLORIDE 0.9 % IR SOLN
Status: DC | PRN
  Administered 2017-06-11: 1000 mL

## 2017-06-11 MED ORDER — LIDOCAINE 2% (20 MG/ML) 5 ML SYRINGE
INTRAMUSCULAR | Status: AC
  Filled 2017-06-11: qty 5

## 2017-06-11 MED ORDER — POLYETHYLENE GLYCOL 3350 17 G PO PACK: 17.0000 g | PACK | Freq: Two times a day (BID) | ORAL | 0 refills | Status: DC

## 2017-06-11 MED ORDER — ONDANSETRON HCL 4 MG/2ML IJ SOLN
INTRAMUSCULAR | Status: DC | PRN
  Administered 2017-06-11: 4 mg via INTRAVENOUS

## 2017-06-11 MED ORDER — FENTANYL CITRATE (PF) 100 MCG/2ML IJ SOLN: 25.0000 ug | INTRAMUSCULAR | Status: DC | PRN

## 2017-06-11 MED ORDER — POLYETHYLENE GLYCOL 3350 17 G PO PACK
17.0000 g | PACK | Freq: Two times a day (BID) | ORAL | Status: DC
  Administered 2017-06-11 – 2017-06-13 (×4): 17 g via ORAL
  Filled 2017-06-11 (×4): qty 1

## 2017-06-11 MED ORDER — METHOCARBAMOL 500 MG PO TABS: 500.0000 mg | ORAL_TABLET | Freq: Four times a day (QID) | ORAL | 0 refills | Status: DC | PRN

## 2017-06-11 MED ORDER — DOCUSATE SODIUM 100 MG PO CAPS
100.0000 mg | ORAL_CAPSULE | Freq: Two times a day (BID) | ORAL | Status: DC
  Administered 2017-06-11 – 2017-06-13 (×4): 100 mg via ORAL
  Filled 2017-06-11 (×4): qty 1

## 2017-06-11 MED ORDER — ASPIRIN 81 MG PO CHEW: 81.0000 mg | CHEWABLE_TABLET | Freq: Two times a day (BID) | ORAL | 0 refills | Status: AC

## 2017-06-11 MED ORDER — MIDAZOLAM HCL 2 MG/2ML IJ SOLN
1.0000 mg | INTRAMUSCULAR | Status: DC | PRN
Start: 2017-06-11 — End: 2017-06-11
  Administered 2017-06-11: 1 mg via INTRAVENOUS

## 2017-06-11 MED ORDER — ATENOLOL 50 MG PO TABS
50.0000 mg | ORAL_TABLET | Freq: Every day | ORAL | Status: DC
  Administered 2017-06-12 – 2017-06-13 (×2): 50 mg via ORAL
  Filled 2017-06-11 (×2): qty 1

## 2017-06-11 MED ORDER — SODIUM CHLORIDE 0.9 % IJ SOLN
INTRAMUSCULAR | Status: DC | PRN
  Administered 2017-06-11: 30 mL

## 2017-06-11 MED ORDER — SODIUM CHLORIDE 0.9 % IJ SOLN
INTRAMUSCULAR | Status: AC
  Filled 2017-06-11: qty 50

## 2017-06-11 MED ORDER — LEVOTHYROXINE SODIUM 100 MCG PO TABS
100.0000 ug | ORAL_TABLET | Freq: Every day | ORAL | Status: DC
  Administered 2017-06-12 – 2017-06-13 (×2): 100 ug via ORAL
  Filled 2017-06-11 (×2): qty 1

## 2017-06-11 MED ORDER — FENTANYL CITRATE (PF) 100 MCG/2ML IJ SOLN
INTRAMUSCULAR | Status: AC
  Administered 2017-06-11: 50 ug via INTRAVENOUS
  Filled 2017-06-11: qty 2

## 2017-06-11 MED ORDER — METOCLOPRAMIDE HCL 5 MG/ML IJ SOLN: 5.0000 mg | Freq: Three times a day (TID) | INTRAMUSCULAR | Status: DC | PRN

## 2017-06-11 MED ORDER — PROMETHAZINE HCL 25 MG/ML IJ SOLN: 6.2500 mg | INTRAMUSCULAR | Status: DC | PRN

## 2017-06-11 MED ORDER — BUPIVACAINE-EPINEPHRINE (PF) 0.25% -1:200000 IJ SOLN
INTRAMUSCULAR | Status: DC | PRN
  Administered 2017-06-11: 30 mL

## 2017-06-11 MED ORDER — MAGNESIUM CITRATE PO SOLN: 1.0000 | Freq: Once | ORAL | Status: DC | PRN

## 2017-06-11 MED ORDER — SODIUM CHLORIDE 0.9 % IV SOLN
INTRAVENOUS | Status: DC
  Administered 2017-06-11 – 2017-06-12 (×2): via INTRAVENOUS

## 2017-06-11 MED ORDER — PROPOFOL 10 MG/ML IV BOLUS
INTRAVENOUS | Status: AC
  Filled 2017-06-11: qty 20

## 2017-06-11 MED ORDER — CEFAZOLIN SODIUM-DEXTROSE 2-4 GM/100ML-% IV SOLN
INTRAVENOUS | Status: AC
  Filled 2017-06-11: qty 100

## 2017-06-11 MED ORDER — METOCLOPRAMIDE HCL 5 MG PO TABS: 5.0000 mg | ORAL_TABLET | Freq: Three times a day (TID) | ORAL | Status: DC | PRN

## 2017-06-11 MED ORDER — TRIAMTERENE-HCTZ 37.5-25 MG PO TABS
0.5000 | ORAL_TABLET | Freq: Every day | ORAL | Status: DC
  Administered 2017-06-11: 0.5 via ORAL
  Filled 2017-06-11 (×3): qty 1

## 2017-06-11 MED ORDER — ONDANSETRON HCL 4 MG/2ML IJ SOLN
INTRAMUSCULAR | Status: AC
  Filled 2017-06-11: qty 2

## 2017-06-11 MED ORDER — MIDAZOLAM HCL 2 MG/2ML IJ SOLN
INTRAMUSCULAR | Status: AC
  Administered 2017-06-11: 1 mg via INTRAVENOUS
  Filled 2017-06-11: qty 2

## 2017-06-11 MED ORDER — DEXAMETHASONE SODIUM PHOSPHATE 10 MG/ML IJ SOLN
10.0000 mg | Freq: Once | INTRAMUSCULAR | Status: AC
  Administered 2017-06-12: 09:00:00 10 mg via INTRAVENOUS
  Filled 2017-06-11: qty 1

## 2017-06-11 MED ORDER — CLORAZEPATE DIPOTASSIUM 7.5 MG PO TABS: 7.5000 mg | ORAL_TABLET | Freq: Every day | ORAL | Status: DC | PRN

## 2017-06-11 MED ORDER — CEFAZOLIN SODIUM-DEXTROSE 2-4 GM/100ML-% IV SOLN
2.0000 g | Freq: Four times a day (QID) | INTRAVENOUS | Status: AC
  Administered 2017-06-11 (×2): 2 g via INTRAVENOUS
  Filled 2017-06-11 (×2): qty 100

## 2017-06-11 MED ORDER — CEFAZOLIN SODIUM-DEXTROSE 2-4 GM/100ML-% IV SOLN
2.0000 g | INTRAVENOUS | Status: AC
  Administered 2017-06-11: 2 g via INTRAVENOUS

## 2017-06-11 MED ORDER — DIPHENHYDRAMINE HCL 25 MG PO CAPS: 25.0000 mg | ORAL_CAPSULE | Freq: Four times a day (QID) | ORAL | Status: DC | PRN

## 2017-06-11 MED ORDER — METHOCARBAMOL 1000 MG/10ML IJ SOLN
500.0000 mg | Freq: Four times a day (QID) | INTRAVENOUS | Status: DC | PRN
  Administered 2017-06-11: 500 mg via INTRAVENOUS
  Filled 2017-06-11: qty 550

## 2017-06-11 MED ORDER — HYDROCODONE-ACETAMINOPHEN 7.5-325 MG PO TABS
1.0000 | ORAL_TABLET | ORAL | Status: DC
  Administered 2017-06-11 (×2): 2 via ORAL
  Administered 2017-06-11 (×2): 1 via ORAL
  Administered 2017-06-12: 2 via ORAL
  Administered 2017-06-12: 1 via ORAL
  Administered 2017-06-12: 04:00:00 2 via ORAL
  Administered 2017-06-12: 09:00:00 1 via ORAL
  Administered 2017-06-12 – 2017-06-13 (×2): 2 via ORAL
  Administered 2017-06-13: 1 via ORAL
  Administered 2017-06-13: 2 via ORAL
  Filled 2017-06-11: qty 2
  Filled 2017-06-11: qty 1
  Filled 2017-06-11 (×6): qty 2
  Filled 2017-06-11: qty 1
  Filled 2017-06-11: qty 2
  Filled 2017-06-11 (×2): qty 1
  Filled 2017-06-11: qty 2

## 2017-06-11 MED ORDER — EPHEDRINE 5 MG/ML INJ
INTRAVENOUS | Status: AC
  Filled 2017-06-11: qty 10

## 2017-06-11 MED ORDER — TRANEXAMIC ACID 1000 MG/10ML IV SOLN
1000.0000 mg | Freq: Once | INTRAVENOUS | Status: AC
  Administered 2017-06-11: 16:00:00 1000 mg via INTRAVENOUS
  Filled 2017-06-11: qty 1100

## 2017-06-11 MED ORDER — FERROUS SULFATE 325 (65 FE) MG PO TABS
325.0000 mg | ORAL_TABLET | Freq: Three times a day (TID) | ORAL | Status: DC
  Administered 2017-06-11 – 2017-06-13 (×5): 325 mg via ORAL
  Filled 2017-06-11 (×5): qty 1

## 2017-06-11 MED ORDER — TRANEXAMIC ACID 1000 MG/10ML IV SOLN
1000.0000 mg | INTRAVENOUS | Status: AC
  Administered 2017-06-11: 1000 mg via INTRAVENOUS
  Filled 2017-06-11: qty 1100

## 2017-06-11 MED ORDER — MEPERIDINE HCL 50 MG/ML IJ SOLN: 6.2500 mg | INTRAMUSCULAR | Status: DC | PRN

## 2017-06-11 MED ORDER — HYDROMORPHONE HCL-NACL 0.5-0.9 MG/ML-% IV SOSY: 0.2500 mg | PREFILLED_SYRINGE | INTRAVENOUS | Status: DC | PRN

## 2017-06-11 MED ORDER — EPHEDRINE SULFATE-NACL 50-0.9 MG/10ML-% IV SOSY
PREFILLED_SYRINGE | INTRAVENOUS | Status: DC | PRN
  Administered 2017-06-11 (×2): 5 mg via INTRAVENOUS

## 2017-06-11 MED ORDER — CELECOXIB 200 MG PO CAPS
200.0000 mg | ORAL_CAPSULE | Freq: Two times a day (BID) | ORAL | Status: DC
  Administered 2017-06-11 – 2017-06-13 (×4): 200 mg via ORAL
  Filled 2017-06-11 (×4): qty 1

## 2017-06-11 MED ORDER — FENTANYL CITRATE (PF) 100 MCG/2ML IJ SOLN
50.0000 ug | INTRAMUSCULAR | Status: DC | PRN
  Administered 2017-06-11: 50 ug via INTRAVENOUS

## 2017-06-11 MED ORDER — HYDROCODONE-ACETAMINOPHEN 7.5-325 MG PO TABS: 1.0000 | ORAL_TABLET | ORAL | 0 refills | Status: DC | PRN

## 2017-06-11 MED ORDER — KETOROLAC TROMETHAMINE 30 MG/ML IJ SOLN
INTRAMUSCULAR | Status: DC | PRN
  Administered 2017-06-11: 30 mg

## 2017-06-11 MED ORDER — BUPIVACAINE-EPINEPHRINE (PF) 0.25% -1:200000 IJ SOLN
INTRAMUSCULAR | Status: AC
  Filled 2017-06-11: qty 30

## 2017-06-11 MED ORDER — ROPIVACAINE HCL 7.5 MG/ML IJ SOLN
INTRAMUSCULAR | Status: DC | PRN
  Administered 2017-06-11 (×4): 5 mL via PERINEURAL

## 2017-06-11 MED ORDER — ACETAMINOPHEN 10 MG/ML IV SOLN: 1000.0000 mg | Freq: Once | INTRAVENOUS | Status: DC | PRN

## 2017-06-11 MED ORDER — BISACODYL 10 MG RE SUPP: 10.0000 mg | Freq: Every day | RECTAL | Status: DC | PRN

## 2017-06-11 MED ORDER — DEXAMETHASONE SODIUM PHOSPHATE 10 MG/ML IJ SOLN
INTRAMUSCULAR | Status: AC
  Filled 2017-06-11: qty 1

## 2017-06-11 MED ORDER — LACTATED RINGERS IV SOLN
INTRAVENOUS | Status: DC
  Administered 2017-06-11: 1000 mL via INTRAVENOUS

## 2017-06-11 MED ORDER — ONDANSETRON HCL 4 MG/2ML IJ SOLN: 4.0000 mg | Freq: Four times a day (QID) | INTRAMUSCULAR | Status: DC | PRN

## 2017-06-11 MED ORDER — KETOROLAC TROMETHAMINE 30 MG/ML IJ SOLN
INTRAMUSCULAR | Status: AC
  Filled 2017-06-11: qty 1

## 2017-06-11 MED ORDER — ALUM & MAG HYDROXIDE-SIMETH 200-200-20 MG/5ML PO SUSP: 15.0000 mL | ORAL | Status: DC | PRN

## 2017-06-11 MED ORDER — ASPIRIN 81 MG PO CHEW
81.0000 mg | CHEWABLE_TABLET | Freq: Two times a day (BID) | ORAL | Status: DC
  Administered 2017-06-11 – 2017-06-13 (×4): 81 mg via ORAL
  Filled 2017-06-11 (×4): qty 1

## 2017-06-11 SURGICAL SUPPLY — 45 items
BAG DECANTER FOR FLEXI CONT (MISCELLANEOUS) IMPLANT
BAG ZIPLOCK 12X15 (MISCELLANEOUS) IMPLANT
BANDAGE ACE 6X5 VEL STRL LF (GAUZE/BANDAGES/DRESSINGS) ×3 IMPLANT
BLADE SAW SGTL 11.0X1.19X90.0M (BLADE) IMPLANT
BLADE SAW SGTL 13.0X1.19X90.0M (BLADE) ×3 IMPLANT
BOWL SMART MIX CTS (DISPOSABLE) ×3 IMPLANT
CAPT KNEE TOTAL 3 ATTUNE ×3 IMPLANT
CEMENT HV SMART SET (Cement) ×6 IMPLANT
COVER SURGICAL LIGHT HANDLE (MISCELLANEOUS) ×3 IMPLANT
CUFF TOURN SGL QUICK 34 (TOURNIQUET CUFF) ×2
CUFF TRNQT CYL 34X4X40X1 (TOURNIQUET CUFF) ×1 IMPLANT
DECANTER SPIKE VIAL GLASS SM (MISCELLANEOUS) ×3 IMPLANT
DERMABOND ADVANCED (GAUZE/BANDAGES/DRESSINGS) ×2
DERMABOND ADVANCED .7 DNX12 (GAUZE/BANDAGES/DRESSINGS) ×1 IMPLANT
DRAPE U-SHAPE 47X51 STRL (DRAPES) ×3 IMPLANT
DRESSING AQUACEL AG SP 3.5X10 (GAUZE/BANDAGES/DRESSINGS) ×1 IMPLANT
DRSG AQUACEL AG ADV 3.5X10 (GAUZE/BANDAGES/DRESSINGS) ×3 IMPLANT
DRSG AQUACEL AG SP 3.5X10 (GAUZE/BANDAGES/DRESSINGS) ×3
DURAPREP 26ML APPLICATOR (WOUND CARE) ×6 IMPLANT
ELECT REM PT RETURN 15FT ADLT (MISCELLANEOUS) ×3 IMPLANT
GLOVE BIOGEL M 7.0 STRL (GLOVE) ×6 IMPLANT
GLOVE BIOGEL PI IND STRL 7.5 (GLOVE) ×1 IMPLANT
GLOVE BIOGEL PI IND STRL 8.5 (GLOVE) IMPLANT
GLOVE BIOGEL PI INDICATOR 7.5 (GLOVE) ×2
GLOVE BIOGEL PI INDICATOR 8.5 (GLOVE)
GLOVE ECLIPSE 8.0 STRL XLNG CF (GLOVE) IMPLANT
GLOVE ORTHO TXT STRL SZ7.5 (GLOVE) ×6 IMPLANT
GOWN STRL REUS W/TWL LRG LVL3 (GOWN DISPOSABLE) ×3 IMPLANT
GOWN STRL REUS W/TWL XL LVL3 (GOWN DISPOSABLE) ×3 IMPLANT
HANDPIECE INTERPULSE COAX TIP (DISPOSABLE) ×2
MANIFOLD NEPTUNE II (INSTRUMENTS) ×3 IMPLANT
PACK TOTAL KNEE CUSTOM (KITS) ×3 IMPLANT
POSITIONER SURGICAL ARM (MISCELLANEOUS) ×3 IMPLANT
SET HNDPC FAN SPRY TIP SCT (DISPOSABLE) ×1 IMPLANT
SET PAD KNEE POSITIONER (MISCELLANEOUS) ×3 IMPLANT
SUT MNCRL AB 4-0 PS2 18 (SUTURE) ×3 IMPLANT
SUT STRATAFIX 0 PDS 27 VIOLET (SUTURE) ×3
SUT VIC AB 1 CT1 36 (SUTURE) ×3 IMPLANT
SUT VIC AB 2-0 CT1 27 (SUTURE) ×6
SUT VIC AB 2-0 CT1 TAPERPNT 27 (SUTURE) ×3 IMPLANT
SUTURE STRATFX 0 PDS 27 VIOLET (SUTURE) ×1 IMPLANT
SYR 50ML LL SCALE MARK (SYRINGE) ×3 IMPLANT
WATER STERILE IRR 1500ML POUR (IV SOLUTION) ×3 IMPLANT
WRAP KNEE MAXI GEL POST OP (GAUZE/BANDAGES/DRESSINGS) ×3 IMPLANT
YANKAUER SUCT BULB TIP 10FT TU (MISCELLANEOUS) ×3 IMPLANT

## 2017-06-11 NOTE — Anesthesia Preprocedure Evaluation (Signed)
Anesthesia Evaluation  Patient identified by MRN, date of birth, ID band Patient awake    Reviewed: Allergy & Precautions, NPO status , Patient's Chart, lab work & pertinent test results, reviewed documented beta blocker date and time   Airway Mallampati: I       Dental no notable dental hx. (+) Teeth Intact   Pulmonary neg pulmonary ROS,    Pulmonary exam normal breath sounds clear to auscultation       Cardiovascular hypertension, Pt. on medications and Pt. on home beta blockers negative cardio ROS Normal cardiovascular exam Rhythm:Regular Rate:Normal     Neuro/Psych PSYCHIATRIC DISORDERS Depression negative neurological ROS     GI/Hepatic negative GI ROS, Neg liver ROS,   Endo/Other  Hypothyroidism   Renal/GU   negative genitourinary   Musculoskeletal  (+) Arthritis , Osteoarthritis,    Abdominal (+) + obese,   Peds negative pediatric ROS (+)  Hematology negative hematology ROS (+)   Anesthesia Other Findings   Reproductive/Obstetrics negative OB ROS                             Anesthesia Physical Anesthesia Plan  ASA: II  Anesthesia Plan: Spinal   Post-op Pain Management:  Regional for Post-op pain   Induction:   PONV Risk Score and Plan: 2 and Ondansetron and Dexamethasone  Airway Management Planned: Nasal Cannula, Natural Airway and Simple Face Mask  Additional Equipment:   Intra-op Plan:   Post-operative Plan:   Informed Consent: I have reviewed the patients History and Physical, chart, labs and discussed the procedure including the risks, benefits and alternatives for the proposed anesthesia with the patient or authorized representative who has indicated his/her understanding and acceptance.     Plan Discussed with: CRNA and Surgeon  Anesthesia Plan Comments:         Anesthesia Quick Evaluation

## 2017-06-11 NOTE — Discharge Instructions (Signed)

## 2017-06-11 NOTE — Interval H&P Note (Signed)
History and Physical Interval Note:  06/11/2017 10:07 AM  Desiree Rasmussen  has presented today for surgery, with the diagnosis of Right knee osteoarthritis  The various methods of treatment have been discussed with the patient and family. After consideration of risks, benefits and other options for treatment, the patient has consented to  Procedure(s) with comments: RIGHT TOTAL KNEE ARTHROPLASTY (Right) - 70 mins as a surgical intervention .  The patient's history has been reviewed, patient examined, no change in status, stable for surgery.  I have reviewed the patient's chart and labs.  Questions were answered to the patient's satisfaction.     Mauri Pole

## 2017-06-11 NOTE — Anesthesia Procedure Notes (Signed)
Spinal  Patient location during procedure: OR Start time: 06/11/2017 11:13 AM End time: 06/11/2017 11:16 AM Staffing Anesthesiologist: Lyn Hollingshead Performed: anesthesiologist  Preanesthetic Checklist Completed: patient identified, surgical consent, pre-op evaluation, timeout performed, IV checked, risks and benefits discussed and monitors and equipment checked Spinal Block Patient position: sitting Prep: ChloraPrep and site prepped and draped Patient monitoring: heart rate, cardiac monitor, continuous pulse ox and blood pressure Location: L3-4 Injection technique: single-shot Needle Needle type: Pencan  Needle gauge: 24 G Needle length: 10 cm Needle insertion depth: 7 cm Assessment Sensory level: T8

## 2017-06-11 NOTE — Anesthesia Postprocedure Evaluation (Signed)
Anesthesia Post Note  Patient: Desiree Rasmussen  Procedure(s) Performed: Procedure(s) (LRB): RIGHT TOTAL KNEE ARTHROPLASTY (Right)     Patient location during evaluation: PACU Anesthesia Type: Spinal Level of consciousness: awake Pain management: pain level controlled Vital Signs Assessment: post-procedure vital signs reviewed and stable Respiratory status: spontaneous breathing Cardiovascular status: stable Postop Assessment: no headache, no backache, spinal receding, patient able to bend at knees and no signs of nausea or vomiting Anesthetic complications: no    Last Vitals:  Vitals:   06/11/17 1330 06/11/17 1345  BP: 128/77 133/61  Pulse: 68 67  Resp: 16 14  Temp:    SpO2: 99% 99%    Last Pain:  Vitals:   06/11/17 0902  TempSrc: Oral   Pain Goal: Patients Stated Pain Goal: 4 (06/11/17 0937)               Shanina Kepple JR,JOHN Mateo Flow

## 2017-06-11 NOTE — Anesthesia Procedure Notes (Addendum)
Anesthesia Regional Block: Adductor canal block   Pre-Anesthetic Checklist: ,, timeout performed, Correct Patient, Correct Site, Correct Laterality, Correct Procedure, Correct Position, site marked, Risks and benefits discussed,  Surgical consent,  Pre-op evaluation,  At surgeon's request and post-op pain management  Laterality: Right and Lower  Prep: chloraprep       Needles:  Injection technique: Single-shot  Needle Type: Echogenic Stimulator Needle     Needle Length: 9cm  Needle Gauge: 21   Needle insertion depth: 5 cm   Additional Needles:   Procedures: ultrasound guided,,,,,,,,  Narrative:  Start time: 06/11/2017 10:50 AM End time: 06/11/2017 10:58 AM Injection made incrementally with aspirations every 5 mL.  Performed by: Personally  Anesthesiologist: Lyn Hollingshead

## 2017-06-11 NOTE — Transfer of Care (Signed)
Immediate Anesthesia Transfer of Care Note  Patient: Desiree Rasmussen  Procedure(s) Performed: Procedure(s) with comments: RIGHT TOTAL KNEE ARTHROPLASTY (Right) - 70 mins  Patient Location: PACU  Anesthesia Type:MAC and Spinal  Level of Consciousness: awake, alert , oriented and patient cooperative  Airway & Oxygen Therapy: Patient Spontanous Breathing and Patient connected to face mask oxygen  Post-op Assessment: Report given to RN and Post -op Vital signs reviewed and stable  Post vital signs: Reviewed and stable  Last Vitals:  Vitals:   06/11/17 1102 06/11/17 1104  BP: 116/77   Pulse: 65 63  Resp: 12 12  Temp:    SpO2: 98% 97%    Last Pain:  Vitals:   06/11/17 0902  TempSrc: Oral      Patients Stated Pain Goal: 4 (68/15/94 7076)  Complications: No apparent anesthesia complications

## 2017-06-11 NOTE — Progress Notes (Signed)
Assisted Dr. Hatchett with right, ultrasound guided, adductor canal block. Side rails up, monitors on throughout procedure. See vital signs in flow sheet. Tolerated Procedure well.  

## 2017-06-11 NOTE — Op Note (Signed)
NAME:  Desiree Rasmussen                      MEDICAL RECORD NO.:  [id number]                             FACILITY:  [facility]      PHYSICIAN:  Pietro Cassis. Alvan Dame, M.D.  DATE OF BIRTH:  03/05/1941      DATE OF PROCEDURE:  06/11/2017                                     OPERATIVE REPORT         PREOPERATIVE DIAGNOSIS:  Right knee osteoarthritis.      POSTOPERATIVE DIAGNOSIS:  Right knee osteoarthritis.      FINDINGS:  The patient was noted to have complete loss of cartilage and   bone-on-bone arthritis with associated osteophytes in the medial and most significantly the patellofemoral compartments of   the knee with a significant synovitis and associated effusion.      PROCEDURE:  Right total knee replacement.      COMPONENTS USED:  DePuy Attune rotating platform posterior stabilized knee   system, a size 4N femur, 4 tibia, size mm PS AOX insert, and 35 anatomic patellar   button.      SURGEON:  Pietro Cassis. Alvan Dame, M.D.      ASSISTANT:  Nehemiah Massed, PA-C.      ANESTHESIA:  Regional and Spinal.      SPECIMENS:  None.      COMPLICATION:  None.      DRAINS:  None.  EBL: <100cc      TOURNIQUET TIME:   Total Tourniquet Time Documented: Thigh (Right) - 24 minutes Total: Thigh (Right) - 24 minutes  .      The patient was stable to the recovery room.      INDICATION FOR PROCEDURE:  Desiree Rasmussen is a 76 y.o. female patient of   mine.  The patient had been seen, evaluated, and treated conservatively in the   office with medication, activity modification, and injections.  The patient had   radiographic changes of bone-on-bone arthritis with endplate sclerosis and osteophytes noted.      The patient failed conservative measures including medication, injections, and activity modification, and at this point was ready for more definitive measures.   Based on the radiographic changes and failed conservative measures, the patient   decided to proceed with total knee replacement.  Risks of  infection,   DVT, component failure, need for revision surgery, postop course, and   expectations were all   discussed and reviewed.  Consent was obtained for benefit of pain   relief.      PROCEDURE IN DETAIL:  The patient was brought to the operative theater.   Once adequate anesthesia, preoperative antibiotics, 2 gm of Ancef, 1 gm of Tranexamic Acid, and 10 mg of Decadron administered, the patient was positioned supine with the right thigh tourniquet placed.  The  right lower extremity was prepped and draped in sterile fashion.  A time-   out was performed identifying the patient, planned procedure, and   extremity.      The right lower extremity was placed in the [facility] leg holder.  The leg was   exsanguinated, tourniquet elevated to 250 mmHg.  A midline incision  was   made followed by median parapatellar arthrotomy.  Following initial   exposure, attention was first directed to the patella.  Precut   measurement was noted to be 23 mm.  I resected down to 13 mm and used a   35 anatomic patellar button to restore patellar height as well as cover the cut   surface.      The lug holes were drilled and a metal shim was placed to protect the   patella from retractors and saw blades.      At this point, attention was now directed to the femur.  The femoral   canal was opened with a drill, irrigated to try to prevent fat emboli.  An   intramedullary rod was passed at 3 degrees valgus, 9 mm of bone was   resected off the distal femur.  Following this resection, the tibia was   subluxated anteriorly.  Using the extramedullary guide, 2 mm of bone was resected off   the proximal medial tibia.  We confirmed the gap would be   stable medially and laterally with a size 5 spacer block as well as confirmed   the cut was perpendicular in the coronal plane, checking with an alignment rod.      Once this was done, I sized the femur to be a size 4 in the anterior-   posterior dimension, chose a  narrow component based on medial and   lateral dimension.  The size 4 rotation block was then pinned in   position anterior referenced using the C-clamp to set rotation.  The   anterior, posterior, and  chamfer cuts were made without difficulty nor   notching making certain that I was along the anterior cortex to help   with flexion gap stability.      The final box cut was made off the lateral aspect of distal femur.      At this point, the tibia was sized to be a size 4, the size 4 tray was   then pinned in position through the medial third of the tubercle,   drilled, and keel punched.  Trial reduction was now carried with a 4 femur,  4 tibia, a size 6 insert insert, and the 35 anatomic patella botton.  The knee was brought to   extension, full extension with good flexion stability with the patella   tracking through the trochlea without application of pressure.  Given   all these findings the femoral lug holes were drilled and then the trial components removed.  Final components were   opened and cement was mixed.  The knee was irrigated with normal saline   solution and pulse lavage.  The synovial lining was   then injected with 30cc of  0.25% Marcaine with epinephrine and 1 cc of Toradol plus 30 cc of NS for a total of 61 cc.      The knee was irrigated.  Final implants were then cemented onto clean and   dried cut surfaces of bone with the knee brought to extension with a size 6 mm PS trial insert.      Once the cement had fully cured, the excess cement was removed   throughout the knee.  I confirmed I was satisfied with the range of   motion and stability, and the final size 6 mm PS AOX insert was chosen.  It was   placed into the knee.      The tourniquet had been let  down at 24 minutes.  No significant   hemostasis required.  The   extensor mechanism was then reapproximated using #1 Vicryl and #0 Stratafix sutures with the knee   in flexion.  The   remaining wound was closed  with 2-0 Vicryl and running 4-0 Monocryl.   The knee was cleaned, dried, dressed sterilely using Dermabond and   Aquacel dressing.  The patient was then   brought to recovery room in stable condition, tolerating the procedure   well.   Please note that Physician Assistant, Nehemiah Massed, PA-C, was present for the entirety of the case, and was utilized for pre-operative positioning, peri-operative retractor management, general facilitation of the procedure.  He was also utilized for primary wound closure at the end of the case.              Pietro Cassis Alvan Dame, M.D.    06/11/2017 12:28 PM

## 2017-06-12 DIAGNOSIS — E669 Obesity, unspecified: Secondary | ICD-10-CM | POA: Diagnosis present

## 2017-06-12 LAB — CBC
HEMATOCRIT: 36.7 % (ref 36.0–46.0)
Hemoglobin: 12.3 g/dL (ref 12.0–15.0)
MCH: 31.3 pg (ref 26.0–34.0)
MCHC: 33.5 g/dL (ref 30.0–36.0)
MCV: 93.4 fL (ref 78.0–100.0)
PLATELETS: 166 10*3/uL (ref 150–400)
RBC: 3.93 MIL/uL (ref 3.87–5.11)
RDW: 12.8 % (ref 11.5–15.5)
WBC: 8 10*3/uL (ref 4.0–10.5)

## 2017-06-12 LAB — BASIC METABOLIC PANEL
ANION GAP: 8 (ref 5–15)
BUN: 19 mg/dL (ref 6–20)
CALCIUM: 8.5 mg/dL — AB (ref 8.9–10.3)
CO2: 27 mmol/L (ref 22–32)
Chloride: 102 mmol/L (ref 101–111)
Creatinine, Ser: 1.06 mg/dL — ABNORMAL HIGH (ref 0.44–1.00)
GFR calc Af Amer: 58 mL/min — ABNORMAL LOW (ref >60)
GFR, EST NON AFRICAN AMERICAN: 50 mL/min — AB (ref >60)
GLUCOSE: 167 mg/dL — AB (ref 65–99)
Potassium: 3.7 mmol/L (ref 3.5–5.1)
Sodium: 137 mmol/L (ref 135–145)

## 2017-06-12 NOTE — Progress Notes (Signed)
Physical Therapy Treatment Patient Details Name: Desiree Rasmussen MRN: [id number] DOB: [birth year] Today's Date: 06/12/2017    History of Present Illness RIGHT TOTAL KNEE ARTHROPLASTY (Right)     PT Comments    Progressing very well. Plans DC tomorrow.   Follow Up Recommendations   (virtual)     Equipment Recommendations  None recommended by PT    Recommendations for Other Services       Precautions / Restrictions Precautions Precautions: Fall;Knee    Mobility  Bed Mobility Overal bed mobility: Needs Assistance Bed Mobility: Supine to Sit     Supine to sit: Supervision     General bed mobility comments: in recliner  Transfers Overall transfer level: Needs assistance Equipment used: Rolling walker (2 wheeled) Transfers: Sit to/from Stand Sit to Stand: Min guard Stand pivot transfers: Min assist       General transfer comment: Vc for technique  Ambulation/Gait Ambulation/Gait assistance: Min assist Ambulation Distance (Feet): 120 Feet Assistive device: Rolling walker (2 wheeled) Gait Pattern/deviations: Step-through pattern     General Gait Details: cues for sequence   Stairs            Wheelchair Mobility    Modified Rankin (Stroke Patients Only)       Balance                                            Cognition Arousal/Alertness: Awake/alert Behavior During Therapy: WFL for tasks assessed/performed Overall Cognitive Status: Within Functional Limits for tasks assessed                                        Exercises Total Joint Exercises Ankle Circles/Pumps: AROM;Both;10 reps Quad Sets: AROM;Both;10 reps Towel Squeeze: AROM;Right;10 reps Short Arc Quad: AROM;Right;10 reps Heel Slides: AROM;Right;10 reps Hip ABduction/ADduction: AROM;Right;10 reps Straight Leg Raises: AROM;Right;10 reps    General Comments        Pertinent Vitals/Pain Pain Assessment: 0-10 Pain Score: 3  Pain Location: R  knee Pain Descriptors / Indicators: Dull Pain Intervention(s): Monitored during session    Home Living Family/patient expects to be discharged to:: Private residence Living Arrangements: Children Available Help at Discharge: Family Type of Home: House Home Access: Stairs to enter Entrance Stairs-Rails: Right Home Layout: One level Home Equipment: Environmental consultant - 2 wheels;Shower seat      Prior Function Level of Independence: Independent          PT Goals (current goals can now be found in the care plan section) Acute Rehab PT Goals Patient Stated Goal: home tomorrow PT Goal Formulation: With patient/family Time For Goal Achievement: 06/15/17 Potential to Achieve Goals: Good Progress towards PT goals: Progressing toward goals    Frequency    7X/week      PT Plan Current plan remains appropriate    Co-evaluation              AM-PAC PT "6 Clicks" Daily Activity  Outcome Measure  Difficulty turning over in bed (including adjusting bedclothes, sheets and blankets)?: A Lot Difficulty moving from lying on back to sitting on the side of the bed? : A Lot Difficulty sitting down on and standing up from a chair with arms (e.g., wheelchair, bedside commode, etc,.)?: A Lot Help needed moving to and from a bed to  chair (including a wheelchair)?: A Little Help needed walking in hospital room?: A Little Help needed climbing 3-5 steps with a railing? : A Lot 6 Click Score: 14    End of Session Equipment Utilized During Treatment: Gait belt Activity Tolerance: Patient tolerated treatment well Patient left: in chair;with call bell/phone within reach;with family/visitor present Nurse Communication: Mobility status PT Visit Diagnosis: Difficulty in walking, not elsewhere classified (R26.2);Pain Pain - Right/Left: Right Pain - part of body: Knee     Time: 0813-8871 PT Time Calculation (min) (ACUTE ONLY): 23 min  Charges:  $Gait Training: 23-37 mins                    G  CodesTresa Endo PT [phone]    Claretha Cooper 06/12/2017, 2:58 PM

## 2017-06-12 NOTE — Evaluation (Signed)
Physical Therapy Evaluation Patient Details Name: Desiree Rasmussen MRN: [id number] DOB: 1940-10-27 Today's Date: 06/12/2017   History of Present Illness  RIGHT TOTAL KNEE ARTHROPLASTY (Right)   Clinical Impression  The patient was not dizzy this visit. Ambulated x 120'. Plans Dc tomorrow. Pt admitted with above diagnosis. Pt currently with functional limitations due to the deficits listed below (see PT Problem List).  Pt will benefit from skilled PT to increase their independence and safety with mobility to allow discharge to the venue listed below.       Follow Up Recommendations DC plan and follow up therapy as arranged by surgeon -Virtual therapy   Equipment Recommendations  None recommended by PT    Recommendations for Other Services       Precautions / Restrictions Precautions Precautions: Fall;Knee Restrictions Weight Bearing Restrictions: No      Mobility  Bed Mobility Overal bed mobility: Needs Assistance Bed Mobility: Supine to Sit     Supine to sit: Supervision     General bed mobility comments: in recliner  Transfers Overall transfer level: Needs assistance Equipment used: Rolling walker (2 wheeled) Transfers: Sit to/from Stand Sit to Stand: Min assist Stand pivot transfers: Min assist       General transfer comment: Vc for technique  Ambulation/Gait Ambulation/Gait assistance: Min assist Ambulation Distance (Feet): 120 Feet Assistive device: Rolling walker (2 wheeled) Gait Pattern/deviations: Step-through pattern     General Gait Details: right knee buckled x 1  Stairs            Wheelchair Mobility    Modified Rankin (Stroke Patients Only)       Balance                                             Pertinent Vitals/Pain Pain Assessment: 0-10 Pain Score: 3  Pain Location: R knee Pain Descriptors / Indicators: Dull Pain Intervention(s): Monitored during session;Premedicated before session;Patient requesting  pain meds-RN notified;Ice applied    Home Living Family/patient expects to be discharged to:: Private residence Living Arrangements: Children Available Help at Discharge: Family Type of Home: House Home Access: Stairs to enter Entrance Stairs-Rails: Right Entrance Stairs-Number of Steps: 3 Home Layout: One level Home Equipment: Environmental consultant - 2 wheels;Shower seat      Prior Function Level of Independence: Independent               Hand Dominance        Extremity/Trunk Assessment   Upper Extremity Assessment Upper Extremity Assessment: Defer to OT evaluation    Lower Extremity Assessment Lower Extremity Assessment: RLE deficits/detail RLE Deficits / Details: + SLR, 90* knee flexion       Communication   Communication: No difficulties  Cognition Arousal/Alertness: Awake/alert Behavior During Therapy: WFL for tasks assessed/performed Overall Cognitive Status: Within Functional Limits for tasks assessed                                        General Comments      Exercises Total Joint Exercises Ankle Circles/Pumps: AROM;Both;10 reps Quad Sets: AROM;Both;10 reps Towel Squeeze: AROM;Right;10 reps Short Arc Quad: AROM;Right;10 reps Heel Slides: AROM;Right;10 reps Hip ABduction/ADduction: AROM;Right;10 reps Straight Leg Raises: AROM;Right;10 reps   Assessment/Plan    PT Assessment Patient needs continued PT services  PT Problem List Decreased strength;Decreased range of motion;Decreased activity tolerance;Decreased balance;Decreased mobility;Decreased knowledge of precautions;Decreased safety awareness;Pain;Decreased knowledge of use of DME       PT Treatment Interventions DME instruction;Gait training;Stair training;Functional mobility training;Therapeutic activities;Patient/family education;Therapeutic exercise    PT Goals (Current goals can be found in the Care Plan section)  Acute Rehab PT Goals Patient Stated Goal: home tomorrow PT Goal  Formulation: With patient/family Time For Goal Achievement: 06/15/17 Potential to Achieve Goals: Good    Frequency 7X/week   Barriers to discharge        Co-evaluation               AM-PAC PT "6 Clicks" Daily Activity  Outcome Measure Difficulty turning over in bed (including adjusting bedclothes, sheets and blankets)?: Unable Difficulty moving from lying on back to sitting on the side of the bed? : Unable Difficulty sitting down on and standing up from a chair with arms (e.g., wheelchair, bedside commode, etc,.)?: A Little Help needed moving to and from a bed to chair (including a wheelchair)?: A Little Help needed walking in hospital room?: A Little Help needed climbing 3-5 steps with a railing? : A Lot 6 Click Score: 13    End of Session Equipment Utilized During Treatment: Gait belt Activity Tolerance: Patient tolerated treatment well Patient left: in chair;with call bell/phone within reach;with family/visitor present Nurse Communication: Mobility status PT Visit Diagnosis: Difficulty in walking, not elsewhere classified (R26.2);Pain Pain - Right/Left: Right Pain - part of body: Knee    Time: 1120-1150 PT Time Calculation (min) (ACUTE ONLY): 30 min   Charges:   PT Evaluation $PT Eval Low Complexity: 1 Low PT Treatments $Gait Training: 8-22 mins   PT G CodesTresa Endo PT [phone]  Claretha Cooper 06/12/2017, 12:27 PM

## 2017-06-12 NOTE — Progress Notes (Signed)
     Subjective: 1 Day Post-Op Procedure(s) (LRB): RIGHT TOTAL KNEE ARTHROPLASTY (Right)   Patient reports pain as mild, pain controlled. No events throughout the night. Plan for discharge tomorrow due to underlying medical co-morbidities, pain control and need for inpatient therapy to meet goal of being discharged home safely with family/caregiver.  Past Medical History:  Diagnosis Date  . Arthritis   . Cancer (HCC)    hx of skin cancer   . Chronic kidney disease   . Cyst    right breast  . Depression   . Family history of heart disease   . History of nuclear stress test 05/2012   normal lexiscan myoview  . Hyperlipidemia   . Hypertension   . Pre-diabetes      Objective:   VITALS:   Vitals:   06/12/17 0147 06/12/17 0625  BP: 132/62 120/65  Pulse: 75 69  Resp: 16 16  Temp: 97.7 F (36.5 C) (!) 97.5 F (36.4 C)  SpO2: 99% 100%    Dorsiflexion/Plantar flexion intact Incision: dressing C/D/I No cellulitis present Compartment soft  LABS  Recent Labs  06/12/17 0534  HGB 12.3  HCT 36.7  WBC 8.0  PLT 166     Recent Labs  06/12/17 0534  NA 137  K 3.7  BUN 19  CREATININE 1.06*  GLUCOSE 167*     Assessment/Plan: 1 Day Post-Op Procedure(s) (LRB): RIGHT TOTAL KNEE ARTHROPLASTY (Right) Foley cath d/c'ed Advance diet Up with therapy D/C IV fluids Discharge home, eventually when ready  Obese (BMI 30-39.9) Estimated body mass index is 32.27 kg/m as calculated from the following:   Height as of this encounter: 5\' 4"  (1.626 m).   Weight as of this encounter: 85.3 kg (188 lb). Patient also counseled that weight may inhibit the healing process Patient counseled that losing weight will help with future health issues      [place] Pugh. Marne Meline   PAC  06/12/2017, 8:57 AM

## 2017-06-12 NOTE — Progress Notes (Signed)
Discharge planning, no HH needs identified. Plan for Virtual PT, has DME. [phone]

## 2017-06-12 NOTE — Evaluation (Signed)
Occupational Therapy Evaluation Patient Details Name: Desiree Rasmussen MRN: [id number] DOB: 1941-07-22 Today's Date: 06/12/2017    History of Present Illness RIGHT TOTAL KNEE ARTHROPLASTY (Right)    Clinical Impression  Pt is s/p TKA resulting in the deficits listed below (see OT Problem List).  Pt will benefit from skilled OT to increase their safety and independence with ADL and functional mobility for ADL to facilitate discharge to venue listed below.      Follow Up Recommendations  No OT follow up    Equipment Recommendations  None recommended by OT    Recommendations for Other Services       Precautions / Restrictions Precautions Precautions: Fall;Knee Restrictions Weight Bearing Restrictions: No      Mobility Bed Mobility Overal bed mobility: Needs Assistance Bed Mobility: Supine to Sit     Supine to sit: Supervision     General bed mobility comments: VC for technique  Transfers Overall transfer level: Needs assistance Equipment used: Rolling walker (2 wheeled) Transfers: Sit to/from [facility] Sit to Stand: Min assist Stand pivot transfers: Min assist       General transfer comment: Vc for technique        ADL either performed or assessed with clinical judgement   ADL Overall ADL's : Needs assistance/impaired Eating/Feeding: Set up;Sitting   Grooming: Set up;Sitting   Upper Body Bathing: Set up;Sitting   Lower Body Bathing: Moderate assistance;Sit to/from stand;Cueing for safety;Cueing for sequencing   Upper Body Dressing : Set up;Sitting   Lower Body Dressing: Moderate assistance;Sit to/from stand   Toilet Transfer: Minimal assistance;BSC;RW;Ambulation   Toileting- Clothing Manipulation and Hygiene: Minimal assistance;Sit to/from stand               Vision Patient Visual Report: No change from baseline       Perception     Praxis      Pertinent Vitals/Pain Pain Assessment: 0-10 Pain Score: 2  Pain  Location: R knee Pain Descriptors / Indicators: Dull Pain Intervention(s): Limited activity within patient's tolerance;Ice applied        Extremity/Trunk Assessment Upper Extremity Assessment Upper Extremity Assessment: Overall WFL for tasks assessed           Communication Communication Communication: No difficulties   Cognition Arousal/Alertness: Awake/alert Behavior During Therapy: WFL for tasks assessed/performed Overall Cognitive Status: Within Functional Limits for tasks assessed                                                Home Living Family/patient expects to be discharged to:: Private residence Living Arrangements: Alone Available Help at Discharge: Family Type of Home: House Home Access: Stairs to enter Technical brewer of Steps: 3 Entrance Stairs-Rails: Right Home Layout: One level     Bathroom Shower/Tub: Tub/shower unit;Walk-in shower   Bathroom Toilet: Handicapped height     Home Equipment: Environmental consultant - 2 wheels;Shower seat          Prior Functioning/Environment Level of Independence: Independent                 OT Problem List: Decreased strength;Decreased knowledge of use of DME or AE      OT Treatment/Interventions: Self-care/ADL training;Patient/family education;DME and/or AE instruction    OT Goals(Current goals can be found in the care plan section) Acute Rehab OT Goals Patient Stated Goal: home tomorrow OT Goal  Formulation: With patient Time For Goal Achievement: 06/26/17 Potential to Achieve Goals: Good  OT Frequency: Min 2X/week              AM-PAC PT "6 Clicks" Daily Activity     Outcome Measure Help from another person eating meals?: None Help from another person taking care of personal grooming?: None Help from another person toileting, which includes using toliet, bedpan, or urinal?: A Little Help from another person bathing (including washing, rinsing, drying)?: A Little Help from another  person to put on and taking off regular upper body clothing?: A Little Help from another person to put on and taking off regular lower body clothing?: A Little 6 Click Score: 20   End of Session Equipment Utilized During Treatment: Rolling walker Nurse Communication: Mobility status  Activity Tolerance: Patient tolerated treatment well Patient left: in chair;with call bell/phone within reach;with family/visitor present  OT Visit Diagnosis: Unsteadiness on feet (R26.81);Muscle weakness (generalized) (M62.81)                Time: 8889-1694 OT Time Calculation (min): 23 min Charges:  OT General Charges $OT Visit: 1 Visit OT Evaluation $OT Eval Moderate Complexity: 1 Mod OT Treatments $Self Care/Home Management : 8-22 mins G-Codes:     Kari Baars, OT [phone]  Payton Mccallum D 06/12/2017, 11:22 AM

## 2017-06-13 LAB — CBC
HEMATOCRIT: 35.9 % — AB (ref 36.0–46.0)
Hemoglobin: 12 g/dL (ref 12.0–15.0)
MCH: 31.5 pg (ref 26.0–34.0)
MCHC: 33.4 g/dL (ref 30.0–36.0)
MCV: 94.2 fL (ref 78.0–100.0)
Platelets: 186 10*3/uL (ref 150–400)
RBC: 3.81 MIL/uL — ABNORMAL LOW (ref 3.87–5.11)
RDW: 13.1 % (ref 11.5–15.5)
WBC: 13.1 10*3/uL — ABNORMAL HIGH (ref 4.0–10.5)

## 2017-06-13 LAB — BASIC METABOLIC PANEL
Anion gap: 6 (ref 5–15)
BUN: 22 mg/dL — ABNORMAL HIGH (ref 6–20)
CALCIUM: 8.8 mg/dL — AB (ref 8.9–10.3)
CO2: 27 mmol/L (ref 22–32)
CREATININE: 0.99 mg/dL (ref 0.44–1.00)
Chloride: 107 mmol/L (ref 101–111)
GFR calc non Af Amer: 54 mL/min — ABNORMAL LOW (ref >60)
Glucose, Bld: 169 mg/dL — ABNORMAL HIGH (ref 65–99)
Potassium: 3.8 mmol/L (ref 3.5–5.1)
SODIUM: 140 mmol/L (ref 135–145)

## 2017-06-13 MED ORDER — CIPROFLOXACIN HCL 250 MG PO TABS: 250.0000 mg | ORAL_TABLET | Freq: Two times a day (BID) | ORAL | 0 refills | Status: AC

## 2017-06-13 NOTE — Discharge Summary (Signed)
Physician Discharge Summary  Patient ID: BLAKLEE SHORES MRN: [id number] DOB/AGE: [birth year] 76 y.o.  Admit date: 06/11/2017 Discharge date: 06/13/2017   Procedures:  Procedure(s) (LRB): RIGHT TOTAL KNEE ARTHROPLASTY (Right)  Attending Physician:  Dr. Paralee Cancel   Admission Diagnoses:   Right knee primary OA / pain  Discharge Diagnoses:  Principal Problem:   S/P right TKA Active Problems:   Obese  Past Medical History:  Diagnosis Date  . Arthritis   . Cancer (HCC)    hx of skin cancer   . Chronic kidney disease   . Cyst    right breast  . Depression   . Family history of heart disease   . History of nuclear stress test 05/2012   normal lexiscan myoview  . Hyperlipidemia   . Hypertension   . Pre-diabetes     HPI:    Desiree Rasmussen, 76 y.o. female, has a history of pain and functional disability in the right knee due to arthritis and has failed non-surgical conservative treatments for greater than 12 weeks to include NSAID's and/or analgesics, corticosteriod injections and activity modification.  Onset of symptoms was gradual, starting  years ago with gradually worsening course since that time. The patient noted no past surgery on the right knee(s).  Patient currently rates pain in the right knee(s) at 8 out of 10 with activity. Patient has night pain, worsening of pain with activity and weight bearing, pain that interferes with activities of daily living, pain with passive range of motion, crepitus and joint swelling.  Patient has evidence of periarticular osteophytes and joint space narrowing by imaging studies.  There is no active infection.  Risks, benefits and expectations were discussed with the patient.  Risks including but not limited to the risk of anesthesia, blood clots, nerve damage, blood vessel damage, failure of the prosthesis, infection and up to and including death.  Patient understand the risks, benefits and expectations and wishes to proceed with surgery.    PCP: Seward Carol, MD   Discharged Condition: good  Hospital Course:  Patient underwent the above stated procedure on 06/11/2017. Patient tolerated the procedure well and brought to the recovery room in good condition and subsequently to the floor.  POD #1 BP: 120/65 ; Pulse: 69 ; Temp: 97.5 F (36.4 C) ; Resp: 16 Patient reports pain as mild, pain controlled. No events throughout the night. Plan for discharge tomorrowdue to underlying medical co-morbidities, pain control and need for inpatient therapy to meet goal of being discharged home safely with family/caregiver. Dorsiflexion/plantar flexion intact, incision: dressing C/D/I, no cellulitis present and compartment soft.   LABS  Basename    HGB     12.3  HCT     36.7   POD #2  BP: 143/68 ; Pulse: 65 ; Temp: 97.7 F (36.5 C) ; Resp: 17 Patient reports pain as mild, pain controlled. Working well with PT and OT. Looking forward to improving.  Ready to be discharged home. Dorsiflexion/plantar flexion intact, incision: dressing C/D/I, no cellulitis present and compartment soft.   LABS  Basename    HGB     12.0  HCT     35.9     Discharge Exam: General appearance: alert, cooperative and no distress Extremities: Homans sign is negative, no sign of DVT, no edema, redness or tenderness in the calves or thighs and no ulcers, gangrene or trophic changes  Disposition: Home with follow up in 2 weeks   Follow-up Information    Paralee Cancel,  MD. Schedule an appointment as soon as possible for a visit in 2 week(s).   Specialty:  Orthopedic Surgery Contact information: [address] [place] [address] [phone]           Discharge Instructions    Call MD / Call 911    Complete by:  As directed    If you experience chest pain or shortness of breath, CALL 911 and be transported to the hospital emergency room.  If you develope a fever above 101 F, pus (white drainage) or increased drainage or redness  at the wound, or calf pain, call your surgeon's office.   Change dressing    Complete by:  As directed    Maintain surgical dressing until follow up in the clinic. If the edges start to pull up, may reinforce with tape. If the dressing is no longer working, may remove and cover with gauze and tape, but must keep the area dry and clean.  Call with any questions or concerns.   Constipation Prevention    Complete by:  As directed    Drink plenty of fluids.  Prune juice may be helpful.  You may use a stool softener, such as Colace (over the counter) 100 mg twice a day.  Use MiraLax (over the counter) for constipation as needed.   Diet - low sodium heart healthy    Complete by:  As directed    Discharge instructions    Complete by:  As directed    Maintain surgical dressing until follow up in the clinic. If the edges start to pull up, may reinforce with tape. If the dressing is no longer working, may remove and cover with gauze and tape, but must keep the area dry and clean.  Follow up in 2 weeks at [facility]. Call with any questions or concerns.   Increase activity slowly as tolerated    Complete by:  As directed    Weight bearing as tolerated with assist device (walker, cane, etc) as directed, use it as long as suggested by your surgeon or therapist, typically at least 4-6 weeks.   TED hose    Complete by:  As directed    Use stockings (TED hose) for 2 weeks on both leg(s).  You may remove them at night for sleeping.      Allergies as of 06/13/2017      Reactions   Azithromycin Nausea Only, Other (See Comments)   Caused stomach cramping. Anything in Mycin family caused reaction a long time ago.   Lexapro [escitalopram Oxalate] Diarrhea   Doxycycline Other (See Comments)   Pt unsure   Ibuprofen Nausea And Vomiting   Macrobid [nitrofurantoin] Other (See Comments)   UTI   Sulfur Rash   All over the body      Medication List    STOP taking these medications   meloxicam 7.5  MG tablet Commonly known as:  MOBIC     TAKE these medications   aspirin 81 MG chewable tablet Commonly known as:  ASPIRIN CHILDRENS Chew 1 tablet (81 mg total) by mouth 2 (two) times daily.   atenolol 50 MG tablet Commonly known as:  TENORMIN Take 50 mg by mouth Daily.   ciprofloxacin 250 MG tablet Commonly known as:  CIPRO Take 1 tablet (250 mg total) by mouth 2 (two) times daily.   clorazepate 7.5 MG tablet Commonly known as:  TRANXENE Take 7.5 mg by mouth daily as needed.   DESITIN Oint Apply 1  application topically daily as needed.   docusate sodium 100 MG capsule Commonly known as:  COLACE Take 1 capsule (100 mg total) by mouth 2 (two) times daily.   escitalopram 10 MG tablet Commonly known as:  LEXAPRO Take 10 mg by mouth daily.   ferrous sulfate 325 (65 FE) MG tablet Commonly known as:  FERROUSUL Take 1 tablet (325 mg total) by mouth 3 (three) times daily with meals.   HYDROcodone-acetaminophen 7.5-325 MG tablet Commonly known as:  NORCO Take 1-2 tablets by mouth every 4 (four) hours as needed for moderate pain or severe pain.   methocarbamol 500 MG tablet Commonly known as:  ROBAXIN Take 1 tablet (500 mg total) by mouth every 6 (six) hours as needed for muscle spasms.   multivitamin with minerals tablet Take 1 tablet by mouth daily.   polyethylene glycol packet Commonly known as:  MIRALAX / GLYCOLAX Take 17 g by mouth 2 (two) times daily.   simvastatin 40 MG tablet Commonly known as:  ZOCOR Take 20 mg by mouth every morning.   SYNTHROID 100 MCG tablet Generic drug:  levothyroxine Take 100 mcg by mouth daily before breakfast.   triamterene-hydrochlorothiazide 37.5-25 MG tablet Commonly known as:  MAXZIDE-25 Take 0.5 tablets by mouth daily.            Discharge Care Instructions        Start     Ordered   06/13/17 0000  ciprofloxacin (CIPRO) 250 MG tablet  2 times daily    Question:  Supervising Provider  Answer:  Paralee Cancel    06/13/17 0816   06/12/17 0000  Call MD / Call 911    Comments:  If you experience chest pain or shortness of breath, CALL 911 and be transported to the hospital emergency room.  If you develope a fever above 101 F, pus (white drainage) or increased drainage or redness at the wound, or calf pain, call your surgeon's office.   06/12/17 0900   06/12/17 0000  Discharge instructions    Comments:  Maintain surgical dressing until follow up in the clinic. If the edges start to pull up, may reinforce with tape. If the dressing is no longer working, may remove and cover with gauze and tape, but must keep the area dry and clean.  Follow up in 2 weeks at [facility]. Call with any questions or concerns.   06/12/17 0900   06/12/17 0000  Diet - low sodium heart healthy     06/12/17 0900   06/12/17 0000  Constipation Prevention    Comments:  Drink plenty of fluids.  Prune juice may be helpful.  You may use a stool softener, such as Colace (over the counter) 100 mg twice a day.  Use MiraLax (over the counter) for constipation as needed.   06/12/17 0900   06/12/17 0000  Increase activity slowly as tolerated    Comments:  Weight bearing as tolerated with assist device (walker, cane, etc) as directed, use it as long as suggested by your surgeon or therapist, typically at least 4-6 weeks.   06/12/17 0900   06/12/17 0000  TED hose    Comments:  Use stockings (TED hose) for 2 weeks on both leg(s).  You may remove them at night for sleeping.   06/12/17 0900   06/12/17 0000  Change dressing    Comments:  Maintain surgical dressing until follow up in the clinic. If the edges start to pull up, may reinforce with tape. If the  dressing is no longer working, may remove and cover with gauze and tape, but must keep the area dry and clean.  Call with any questions or concerns.   06/12/17 0900   06/11/17 0000  aspirin (ASPIRIN CHILDRENS) 81 MG chewable tablet  2 times daily    Question:  Supervising Provider   Answer:  Paralee Cancel   06/11/17 0941   06/11/17 0000  docusate sodium (COLACE) 100 MG capsule  2 times daily    Question:  Supervising Provider  Answer:  Paralee Cancel   06/11/17 0941   06/11/17 0000  ferrous sulfate (FERROUSUL) 325 (65 FE) MG tablet  3 times daily with meals    Question:  Supervising Provider  Answer:  Paralee Cancel   06/11/17 0941   06/11/17 0000  polyethylene glycol (MIRALAX / GLYCOLAX) packet  2 times daily    Question:  Supervising Provider  Answer:  Paralee Cancel   06/11/17 0941   06/11/17 0000  methocarbamol (ROBAXIN) 500 MG tablet  Every 6 hours PRN    Question:  Supervising Provider  Answer:  Paralee Cancel   06/11/17 0941   06/11/17 0000  HYDROcodone-acetaminophen (NORCO) 7.5-325 MG tablet  Every 4 hours PRN    Question:  Supervising Provider  Answer:  Paralee Cancel   06/11/17 0941       Signed: West Pugh. Quetzaly Ebner   PA-C  06/13/2017, 12:16 PM

## 2017-06-13 NOTE — Progress Notes (Signed)
Occupational Therapy Treatment Patient Details Name: Desiree Rasmussen MRN: [id number] DOB: November 22, 1940 Today's Date: 06/13/2017    History of present illness RIGHT TOTAL KNEE ARTHROPLASTY (Right)    OT comments  All education completed this session.  Follow Up Recommendations  No OT follow up    Equipment Recommendations  None recommended by OT    Recommendations for Other Services      Precautions / Restrictions Precautions Precautions: Fall;Knee Restrictions Weight Bearing Restrictions: No       Mobility Bed Mobility Overal bed mobility: Modified Independent             General bed mobility comments: flat bed  Transfers   Equipment used: Rolling walker (2 wheeled)   Sit to Stand: Supervision         General transfer comment: cues for UE placement and safety (to feel commode behind her)    Balance                                           ADL either performed or assessed with clinical judgement   ADL       Grooming: Wash/dry hands;Supervision/safety;Standing                   Toilet Transfer: Min guard;Ambulation;Comfort height toilet;RW   Toileting- Clothing Manipulation and Hygiene: Supervision/safety;Sit to/from stand   Tub/ Shower Transfer: Min guard;Walk-in shower;Ambulation     General ADL Comments: practiced bathroom transfers, reviewed safety including keeping walker around body and sidestepping through tight spaces.  Pt verbalizes understanding of knee precautions.  Family will assist with adls     Vision       Perception     Praxis      Cognition Arousal/Alertness: Awake/alert Behavior During Therapy: WFL for tasks assessed/performed Overall Cognitive Status: Within Functional Limits for tasks assessed                                          Exercises     Shoulder Instructions       General Comments      Pertinent Vitals/ Pain       Pain Assessment: Faces Faces Pain  Scale: Hurts a little bit Pain Location: R knee Pain Descriptors / Indicators: Shooting Pain Intervention(s): Limited activity within patient's tolerance;Monitored during session;Premedicated before session;Repositioned;Ice applied  Home Living                                          Prior Functioning/Environment              Frequency           Progress Toward Goals  OT Goals(current goals can now be found in the care plan section)  Progress towards OT goals: Progressing toward goals (no further OT is needed)     Plan      Co-evaluation                 AM-PAC PT "6 Clicks" Daily Activity     Outcome Measure   Help from another person eating meals?: None Help from another person taking care of personal grooming?: A Little Help from another person toileting,  which includes using toliet, bedpan, or urinal?: A Little Help from another person bathing (including washing, rinsing, drying)?: A Little Help from another person to put on and taking off regular upper body clothing?: A Little Help from another person to put on and taking off regular lower body clothing?: A Lot 6 Click Score: 18    End of Session        Activity Tolerance Patient tolerated treatment well   Patient Left in bed;with call bell/phone within reach;with nursing/sitter in room;with family/visitor present   Nurse Communication          Time: 2355-7322 OT Time Calculation (min): 22 min  Charges: OT General Charges $OT Visit: 1 Visit OT Treatments $Self Care/Home Management : 23-37 mins  Lesle Chris, OTR/L [phone] 06/13/2017   Axcel Horsch 06/13/2017, 8:22 AM

## 2017-06-13 NOTE — Progress Notes (Signed)
Physical Therapy Treatment Patient Details Name: Desiree Rasmussen MRN: [id number] DOB: 08-05-1941 Today's Date: 06/13/2017    History of Present Illness RIGHT TOTAL KNEE ARTHROPLASTY (Right)     PT Comments    The patient tolerated very well. Plans DC today.   Follow Up Recommendations  DC plan and follow up therapy as arranged by surgeon     Equipment Recommendations  None recommended by PT    Recommendations for Other Services       Precautions / Restrictions Precautions Precautions: Fall;Knee    Mobility  Bed Mobility Overal bed mobility: Modified Independent             General bed mobility comments: flat bed  Transfers   Equipment used: Rolling walker (2 wheeled) Transfers: Sit to/from Stand Sit to Stand: Supervision         General transfer comment: cues for UE placement and safety   Ambulation/Gait Ambulation/Gait assistance: Min guard Ambulation Distance (Feet): 120 Feet Assistive device: Rolling walker (2 wheeled) Gait Pattern/deviations: Step-through pattern     General Gait Details: cues for sequence   Stairs Stairs: Yes   Stair Management: One rail Right;Step to pattern;Forwards;With cane Number of Stairs: 3 General stair comments: cues for sequence  Wheelchair Mobility    Modified Rankin (Stroke Patients Only)       Balance                                            Cognition Arousal/Alertness: Awake/alert                                            Exercises Total Joint Exercises Ankle Circles/Pumps: AROM;Both;10 reps Quad Sets: AROM;Both;10 reps Towel Squeeze: AROM;Right;10 reps Short Arc Quad: AROM;Right;10 reps Heel Slides: AROM;Right;10 reps Hip ABduction/ADduction: AROM;Right;10 reps Straight Leg Raises: AROM;Right;10 reps Long Arc Quad: AROM;Right;10 reps Knee Flexion: AROM;Right;10 reps Goniometric ROM: 5-100 right knee    General Comments        Pertinent  Vitals/Pain Faces Pain Scale: Hurts a little bit Pain Location: R knee Pain Descriptors / Indicators: Sore Pain Intervention(s): Repositioned;Premedicated before session;Monitored during session    Home Living                      Prior Function            PT Goals (current goals can now be found in the care plan section) Progress towards PT goals: Progressing toward goals    Frequency    7X/week      PT Plan Current plan remains appropriate    Co-evaluation              AM-PAC PT "6 Clicks" Daily Activity  Outcome Measure  Difficulty turning over in bed (including adjusting bedclothes, sheets and blankets)?: None Difficulty moving from lying on back to sitting on the side of the bed? : None Difficulty sitting down on and standing up from a chair with arms (e.g., wheelchair, bedside commode, etc,.)?: None Help needed moving to and from a bed to chair (including a wheelchair)?: A Little Help needed walking in hospital room?: A Little Help needed climbing 3-5 steps with a railing? : A Little 6 Click Score: 21    End of  Session   Activity Tolerance: Patient tolerated treatment well Patient left: in bed;with call bell/phone within reach;with family/visitor present Nurse Communication: Mobility status PT Visit Diagnosis: Difficulty in walking, not elsewhere classified (R26.2);Pain Pain - Right/Left: Right Pain - part of body: Knee     Time: 0910-0943 PT Time Calculation (min) (ACUTE ONLY): 33 min  Charges:  $Gait Training: 8-22 mins $Therapeutic Exercise: 8-22 mins                    G CodesTresa Endo PT [phone]    Claretha Cooper 06/13/2017, 2:34 PM

## 2017-06-13 NOTE — Progress Notes (Signed)
     Subjective: 2 Days Post-Op Procedure(s) (LRB): RIGHT TOTAL KNEE ARTHROPLASTY (Right)   Patient reports pain as mild, pain controlled. Working well with PT and OT. Looking forward to improving.  Ready to be discharged home.  Objective:   VITALS:   Vitals:   06/12/17 2000 06/13/17 0623  BP: (!) 152/65 (!) 143/68  Pulse: 63 65  Resp: 17 17  Temp: 97.8 F (36.6 C) 97.7 F (36.5 C)  SpO2: 98% 97%    Dorsiflexion/Plantar flexion intact Incision: dressing C/D/I No cellulitis present Compartment soft  LABS  Recent Labs  06/12/17 0534 06/13/17 0553  HGB 12.3 12.0  HCT 36.7 35.9*  WBC 8.0 13.1*  PLT 166 186     Recent Labs  06/12/17 0534 06/13/17 0553  NA 137 140  K 3.7 3.8  BUN 19 22*  CREATININE 1.06* 0.99  GLUCOSE 167* 169*     Assessment/Plan: 2 Days Post-Op Procedure(s) (LRB): RIGHT TOTAL KNEE ARTHROPLASTY (Right) Up with therapy Discharge home Follow up in 2 weeks at [facility]. Follow up with OLIN,Cybill Uriegas D in 2 weeks.  Contact information:  [facility] [address] [place] [phone]    Obese (BMI 30-39.9) Estimated body mass index is 32.27 kg/m as calculated from the following:   Height as of this encounter: 5\' 4"  (1.626 m).   Weight as of this encounter: 85.3 kg (188 lb). Patient also counseled that weight may inhibit the healing process Patient counseled that losing weight will help with future health issues       West Pugh. Rosella Crandell   PAC  06/13/2017, 8:13 AM

## 2017-06-28 DIAGNOSIS — Z471 Aftercare following joint replacement surgery: Secondary | ICD-10-CM | POA: Diagnosis not present

## 2017-06-28 DIAGNOSIS — Z96651 Presence of right artificial knee joint: Secondary | ICD-10-CM | POA: Diagnosis not present

## 2017-07-11 DIAGNOSIS — Z0000 Encounter for general adult medical examination without abnormal findings: Secondary | ICD-10-CM | POA: Diagnosis not present

## 2017-07-11 DIAGNOSIS — R110 Nausea: Secondary | ICD-10-CM | POA: Diagnosis not present

## 2017-07-11 DIAGNOSIS — F329 Major depressive disorder, single episode, unspecified: Secondary | ICD-10-CM | POA: Diagnosis not present

## 2017-07-11 DIAGNOSIS — Z7189 Other specified counseling: Secondary | ICD-10-CM | POA: Diagnosis not present

## 2017-07-11 DIAGNOSIS — E7800 Pure hypercholesterolemia, unspecified: Secondary | ICD-10-CM | POA: Diagnosis not present

## 2017-07-11 DIAGNOSIS — E1122 Type 2 diabetes mellitus with diabetic chronic kidney disease: Secondary | ICD-10-CM | POA: Diagnosis not present

## 2017-07-11 DIAGNOSIS — R8290 Unspecified abnormal findings in urine: Secondary | ICD-10-CM | POA: Diagnosis not present

## 2017-07-11 DIAGNOSIS — Z1389 Encounter for screening for other disorder: Secondary | ICD-10-CM | POA: Diagnosis not present

## 2017-07-11 DIAGNOSIS — Z23 Encounter for immunization: Secondary | ICD-10-CM | POA: Diagnosis not present

## 2017-07-11 DIAGNOSIS — R42 Dizziness and giddiness: Secondary | ICD-10-CM | POA: Diagnosis not present

## 2017-07-11 DIAGNOSIS — N183 Chronic kidney disease, stage 3 (moderate): Secondary | ICD-10-CM | POA: Diagnosis not present

## 2017-07-11 DIAGNOSIS — F419 Anxiety disorder, unspecified: Secondary | ICD-10-CM | POA: Diagnosis not present

## 2017-07-26 DIAGNOSIS — Z471 Aftercare following joint replacement surgery: Secondary | ICD-10-CM | POA: Diagnosis not present

## 2017-07-26 DIAGNOSIS — Z96651 Presence of right artificial knee joint: Secondary | ICD-10-CM | POA: Diagnosis not present

## 2017-08-02 DIAGNOSIS — K644 Residual hemorrhoidal skin tags: Secondary | ICD-10-CM | POA: Diagnosis not present

## 2017-08-15 DIAGNOSIS — J309 Allergic rhinitis, unspecified: Secondary | ICD-10-CM | POA: Diagnosis not present

## 2017-08-15 DIAGNOSIS — J069 Acute upper respiratory infection, unspecified: Secondary | ICD-10-CM | POA: Diagnosis not present

## 2017-08-20 DIAGNOSIS — J9801 Acute bronchospasm: Secondary | ICD-10-CM | POA: Diagnosis not present

## 2017-08-20 DIAGNOSIS — G43A0 Cyclical vomiting, not intractable: Secondary | ICD-10-CM | POA: Diagnosis not present

## 2017-08-20 DIAGNOSIS — R112 Nausea with vomiting, unspecified: Secondary | ICD-10-CM | POA: Diagnosis not present

## 2017-08-20 DIAGNOSIS — J209 Acute bronchitis, unspecified: Secondary | ICD-10-CM | POA: Diagnosis not present

## 2017-08-20 DIAGNOSIS — R062 Wheezing: Secondary | ICD-10-CM | POA: Diagnosis not present

## 2017-08-27 DIAGNOSIS — J209 Acute bronchitis, unspecified: Secondary | ICD-10-CM | POA: Diagnosis not present

## 2017-08-27 DIAGNOSIS — R42 Dizziness and giddiness: Secondary | ICD-10-CM | POA: Diagnosis not present

## 2017-09-03 DIAGNOSIS — H40053 Ocular hypertension, bilateral: Secondary | ICD-10-CM | POA: Diagnosis not present

## 2017-09-06 DIAGNOSIS — Z96651 Presence of right artificial knee joint: Secondary | ICD-10-CM | POA: Diagnosis not present

## 2017-09-06 DIAGNOSIS — Z471 Aftercare following joint replacement surgery: Secondary | ICD-10-CM | POA: Diagnosis not present

## 2017-09-16 ENCOUNTER — Other Ambulatory Visit: Payer: Self-pay | Admitting: Internal Medicine

## 2017-09-16 DIAGNOSIS — R42 Dizziness and giddiness: Secondary | ICD-10-CM

## 2017-09-18 ENCOUNTER — Encounter: Payer: Self-pay | Admitting: Neurology

## 2017-09-23 ENCOUNTER — Ambulatory Visit
Admission: RE | Admit: 2017-09-23 | Discharge: 2017-09-23 | Disposition: A | Discharge status: Home / Self Care | Payer: Medicare Other | Source: Ambulatory Visit | Attending: Internal Medicine | Admitting: Internal Medicine

## 2017-09-23 DIAGNOSIS — R42 Dizziness and giddiness: Secondary | ICD-10-CM | POA: Diagnosis not present

## 2017-09-30 DIAGNOSIS — Z1231 Encounter for screening mammogram for malignant neoplasm of breast: Secondary | ICD-10-CM | POA: Diagnosis not present

## 2017-10-09 DIAGNOSIS — Z01419 Encounter for gynecological examination (general) (routine) without abnormal findings: Secondary | ICD-10-CM | POA: Diagnosis not present

## 2017-10-09 DIAGNOSIS — Z124 Encounter for screening for malignant neoplasm of cervix: Secondary | ICD-10-CM | POA: Diagnosis not present

## 2017-10-09 DIAGNOSIS — K644 Residual hemorrhoidal skin tags: Secondary | ICD-10-CM | POA: Diagnosis not present

## 2017-11-12 DIAGNOSIS — I10 Essential (primary) hypertension: Secondary | ICD-10-CM | POA: Diagnosis not present

## 2017-11-12 DIAGNOSIS — N183 Chronic kidney disease, stage 3 (moderate): Secondary | ICD-10-CM | POA: Diagnosis not present

## 2017-11-12 DIAGNOSIS — E782 Mixed hyperlipidemia: Secondary | ICD-10-CM | POA: Diagnosis not present

## 2017-11-12 DIAGNOSIS — E1122 Type 2 diabetes mellitus with diabetic chronic kidney disease: Secondary | ICD-10-CM | POA: Diagnosis not present

## 2017-11-12 DIAGNOSIS — F329 Major depressive disorder, single episode, unspecified: Secondary | ICD-10-CM | POA: Diagnosis not present

## 2017-11-12 DIAGNOSIS — E039 Hypothyroidism, unspecified: Secondary | ICD-10-CM | POA: Diagnosis not present

## 2017-11-13 DIAGNOSIS — E7800 Pure hypercholesterolemia, unspecified: Secondary | ICD-10-CM | POA: Diagnosis not present

## 2017-11-13 DIAGNOSIS — F419 Anxiety disorder, unspecified: Secondary | ICD-10-CM | POA: Diagnosis not present

## 2017-11-13 DIAGNOSIS — E039 Hypothyroidism, unspecified: Secondary | ICD-10-CM | POA: Diagnosis not present

## 2017-11-13 DIAGNOSIS — E1122 Type 2 diabetes mellitus with diabetic chronic kidney disease: Secondary | ICD-10-CM | POA: Diagnosis not present

## 2017-11-13 DIAGNOSIS — N3281 Overactive bladder: Secondary | ICD-10-CM | POA: Diagnosis not present

## 2017-11-13 DIAGNOSIS — I10 Essential (primary) hypertension: Secondary | ICD-10-CM | POA: Diagnosis not present

## 2017-11-15 DIAGNOSIS — E1122 Type 2 diabetes mellitus with diabetic chronic kidney disease: Secondary | ICD-10-CM | POA: Diagnosis not present

## 2017-12-18 DIAGNOSIS — E039 Hypothyroidism, unspecified: Secondary | ICD-10-CM | POA: Diagnosis not present

## 2017-12-18 DIAGNOSIS — E1122 Type 2 diabetes mellitus with diabetic chronic kidney disease: Secondary | ICD-10-CM | POA: Diagnosis not present

## 2017-12-18 DIAGNOSIS — I10 Essential (primary) hypertension: Secondary | ICD-10-CM | POA: Diagnosis not present

## 2017-12-18 DIAGNOSIS — E782 Mixed hyperlipidemia: Secondary | ICD-10-CM | POA: Diagnosis not present

## 2017-12-18 DIAGNOSIS — N183 Chronic kidney disease, stage 3 (moderate): Secondary | ICD-10-CM | POA: Diagnosis not present

## 2017-12-18 DIAGNOSIS — F329 Major depressive disorder, single episode, unspecified: Secondary | ICD-10-CM | POA: Diagnosis not present

## 2018-01-01 DIAGNOSIS — M25562 Pain in left knee: Secondary | ICD-10-CM | POA: Diagnosis not present

## 2018-01-01 DIAGNOSIS — Z96651 Presence of right artificial knee joint: Secondary | ICD-10-CM | POA: Diagnosis not present

## 2018-01-01 DIAGNOSIS — M1712 Unilateral primary osteoarthritis, left knee: Secondary | ICD-10-CM | POA: Diagnosis not present

## 2018-01-02 DIAGNOSIS — H5203 Hypermetropia, bilateral: Secondary | ICD-10-CM | POA: Diagnosis not present

## 2018-01-02 DIAGNOSIS — H04123 Dry eye syndrome of bilateral lacrimal glands: Secondary | ICD-10-CM | POA: Diagnosis not present

## 2018-01-02 DIAGNOSIS — H524 Presbyopia: Secondary | ICD-10-CM | POA: Diagnosis not present

## 2018-01-02 DIAGNOSIS — H25813 Combined forms of age-related cataract, bilateral: Secondary | ICD-10-CM | POA: Diagnosis not present

## 2018-01-02 DIAGNOSIS — H52222 Regular astigmatism, left eye: Secondary | ICD-10-CM | POA: Diagnosis not present

## 2018-01-02 DIAGNOSIS — H04203 Unspecified epiphora, bilateral lacrimal glands: Secondary | ICD-10-CM | POA: Diagnosis not present

## 2018-01-02 DIAGNOSIS — H40011 Open angle with borderline findings, low risk, right eye: Secondary | ICD-10-CM | POA: Diagnosis not present

## 2018-01-06 ENCOUNTER — Encounter: Payer: Self-pay | Admitting: Neurology

## 2018-01-06 ENCOUNTER — Ambulatory Visit (INDEPENDENT_AMBULATORY_CARE_PROVIDER_SITE_OTHER): Payer: Medicare Other | Admitting: Neurology

## 2018-01-06 VITALS — Ht 64.0 in | Wt 196.0 lb

## 2018-01-06 DIAGNOSIS — G44201 Tension-type headache, unspecified, intractable: Secondary | ICD-10-CM | POA: Diagnosis not present

## 2018-01-06 DIAGNOSIS — H8112 Benign paroxysmal vertigo, left ear: Secondary | ICD-10-CM

## 2018-01-06 MED ORDER — PREDNISONE 10 MG PO TABS: ORAL_TABLET | ORAL | 0 refills | Status: AC

## 2018-01-06 NOTE — Patient Instructions (Addendum)
1.  We will start prednisone 10mg  tablet taper:  Take 6tabs x1day, then 5tabs x1day, then 4tabs x1day, then 3tabs x1day, then 2tabs x1day, then 1tab x1day, then STOP.  Do not take Advil while on the prednisone because it can upset your stomach. 2.  Contact me on Friday with update.

## 2018-01-06 NOTE — Progress Notes (Signed)
NEUROLOGY CONSULTATION NOTE  Desiree Rasmussen MRN: [id number] DOB: 02-Apr-1941  Referring provider: Dr. Delfina Redwood Primary care provider: Dr. Delfina Redwood  Reason for consult:  Dizziness.  HISTORY OF PRESENT ILLNESS: Desiree Rasmussen is a 77 year old female with hypertension, hyperlipidemia and CKD who presents for dizziness and headache.  History supplemented by PCP's note.  She has had episodes of vertigo since her 100s.  It is a spinning sensation to the left.  It is usually triggered by turning head or standing too fast, however it may occur spontaneously laying in bed.  It lasts a minute.  There is no nausea, double vision, tinnitus, hearing loss, or unilateral numbness or weakness.  Typically it lasts a few days.  She underwent right knee replacement in September.  Afterward, she developed another bout of vertigo.  However, it persisted for 7 to 8 weeks.  She did report upper respiratory congestion at the time.  She tried meclizine, dramamine and Tranxene.  She had an MRI of the brain on 09/23/17, which was personally reviewed and was normal.  The dizziness has started to subside.  It is mild.  However, she has had a headache for the past 2 days.  It is bi-retroorbital pressure like pain, moderate (sometimes severe) in intensity.  It is constant.  There is no associated nausea, vomiting, visual disturbance, photophobia, phonophobia or unilateral numbness or weakness.  She denies neck pain.  Nothing specifically aggravates or relieves it.  She has taken Tylenol which is ineffective.  She will sometimes takes Advil with moderate efficacy.  She has not had headaches in the past.  She denies any new medication.  She denies depression and anxiety.  PAST MEDICAL HISTORY: Past Medical History:  Diagnosis Date  . Arthritis   . Cancer (HCC)    hx of skin cancer   . Chronic kidney disease   . Cyst    right breast  . Depression   . Family history of heart disease   . History of nuclear stress test  05/2012   normal lexiscan myoview  . Hyperlipidemia   . Hypertension   . Pre-diabetes     PAST SURGICAL HISTORY: Past Surgical History:  Procedure Laterality Date  . [place]  . CYSTECTOMY     sebaceous cyst on breast  . TOTAL KNEE ARTHROPLASTY Right 06/11/2017   Procedure: RIGHT TOTAL KNEE ARTHROPLASTY;  Surgeon: Paralee Cancel, MD;  Location: [facility];  Service: Orthopedics;  Laterality: Right;  70 mins    MEDICATIONS: Current Outpatient Medications on File Prior to Visit  Medication Sig Dispense Refill  . aspirin EC 81 MG tablet Take 81 mg by mouth daily.    Marland Kitchen atenolol (TENORMIN) 50 MG tablet Take 50 mg by mouth Daily.     Marland Kitchen atorvastatin (LIPITOR) 10 MG tablet Take 10 mg by mouth daily.  3  . clorazepate (TRANXENE) 7.5 MG tablet Take 7.5 mg by mouth daily as needed.     . meloxicam (MOBIC) 15 MG tablet Take 15 mg by mouth daily.  3  . Multiple Vitamins-Minerals (MULTIVITAMIN WITH MINERALS) tablet Take 1 tablet by mouth daily.    Marland Kitchen SYNTHROID 100 MCG tablet Take 100 mcg by mouth daily before breakfast.     . triamterene-hydrochlorothiazide (MAXZIDE-25) 37.5-25 MG per tablet Take 0.5 tablets by mouth daily.      No current facility-administered medications on file prior to visit.     ALLERGIES: Allergies  Allergen Reactions  . Azithromycin Nausea  Only and Other (See Comments)    Caused stomach cramping. Anything in Mycin family caused reaction a long time ago.  Loma Sousa [Escitalopram Oxalate] Diarrhea  . Doxycycline Other (See Comments)    Pt unsure  . Ibuprofen Nausea And Vomiting  . Macrobid [Nitrofurantoin] Other (See Comments)    UTI  . Sulfur Rash    All over the body    FAMILY HISTORY: Family History  Problem Relation Age of Onset  . Colon cancer Mother 67  . Heart disease Father 57       heart attack  . Breast cancer Maternal Aunt 75  . Colon cancer Maternal Uncle   . Heart disease Brother        CABG at 109    SOCIAL HISTORY: Social  History   Socioeconomic History  . Marital status: Widowed    Spouse name: Not on file  . Number of children: 2  . Years of education: Not on file  . Highest education level: Not on file  Occupational History  . Not on file  Social Needs  . Financial resource strain: Not on file  . Food insecurity:    Worry: Not on file    Inability: Not on file  . Transportation needs:    Medical: Not on file    Non-medical: Not on file  Tobacco Use  . Smoking status: Never Smoker  . Smokeless tobacco: Never Used  Substance and Sexual Activity  . Alcohol use: No  . Drug use: No  . Sexual activity: Not on file  Lifestyle  . Physical activity:    Days per week: Not on file    Minutes per session: Not on file  . Stress: Not on file  Relationships  . Social connections:    Talks on phone: Not on file    Gets together: Not on file    Attends religious service: Not on file    Active member of club or organization: Not on file    Attends meetings of clubs or organizations: Not on file    Relationship status: Not on file  . Intimate partner violence:    Fear of current or ex partner: Not on file    Emotionally abused: Not on file    Physically abused: Not on file    Forced sexual activity: Not on file  Other Topics Concern  . Not on file  Social History Narrative  . Not on file    REVIEW OF SYSTEMS: Constitutional: No fevers, chills, or sweats, no generalized fatigue, change in appetite Eyes: No visual changes, double vision, eye pain Ear, nose and throat: No hearing loss, ear pain, nasal congestion, sore throat Cardiovascular: No chest pain, palpitations Respiratory:  No shortness of breath at rest or with exertion, wheezes GastrointestinaI: No nausea, vomiting, diarrhea, abdominal pain, fecal incontinence Genitourinary:  No dysuria, urinary retention or frequency Musculoskeletal:  No neck pain, back pain Integumentary: No rash, pruritus, skin lesions Neurological: as  above Psychiatric: No depression, insomnia, anxiety Endocrine: No palpitations, fatigue, diaphoresis, mood swings, change in appetite, change in weight, increased thirst Hematologic/Lymphatic:  No purpura, petechiae. Allergic/Immunologic: no itchy/runny eyes, nasal congestion, recent allergic reactions, rashes  PHYSICAL EXAM: Vitals:   01/06/18 0955  SpO2: 95%   General: No acute distress.  Patient appears well-groomed.  Head:  Normocephalic/atraumatic Eyes:  fundi examined but not visualized Neck: supple, no paraspinal tenderness, full range of motion Back: No paraspinal tenderness Heart: regular rate and rhythm Lungs: Clear to auscultation  bilaterally. Vascular: No carotid bruits. Neurological Exam: Mental status: alert and oriented to person, place, and time, recent and remote memory intact, fund of knowledge intact, attention and concentration intact, speech fluent and not dysarthric, language intact. Cranial nerves: CN I: not tested CN II: pupils equal, round and reactive to light, visual fields intact CN III, IV, VI:  full range of motion, no nystagmus, no ptosis CN V: facial sensation intact CN VII: upper and lower face symmetric CN VIII: hearing intact CN IX, X: gag intact, uvula midline CN XI: sternocleidomastoid and trapezius muscles intact CN XII: tongue midline Bulk & Tone: normal, no fasciculations. Motor:  5/5 throughout  Sensation: temperature and vibration sensation intact. Deep Tendon Reflexes:  absent throughout,  toes downgoing. Finger to nose testing:  Without dysmetria.  Heel to shin:  Without dysmetria.  Gait:  Right antalgic gait.  Romberg with sway.  IMPRESSION: 1.  Benign paroxysmal positional vertigo 2.  New headache, unremarkable neurologic exam.  Probably acute intractable tension-type headache.  PLAN: 1.  Prednisone taper to break headache.  She is advised to contact us on Friday.  If headache persists, will get imaging of head and start a  preventative medication (gabapentin for example). 2.  Limit pain relievers to no more than 2 days out of week to prevent rebound headache 3.  Headache diary 4.  If future vertigo attacks persist, recommend vestibular rehab  Thank you for allowing me to take part in the care of this patient.  Metta Clines, DO  CC:  Seward Carol, MD

## 2018-01-10 ENCOUNTER — Telehealth: Payer: Self-pay | Admitting: Neurology

## 2018-01-10 NOTE — Telephone Encounter (Signed)
Dr Jaffe *FYI* 

## 2018-01-10 NOTE — Telephone Encounter (Signed)
Patient left message on the VM that states that tomorrow will be her last day on the prednisone and it has seem to help. Dr Tomi Likens has asked for a update so she was just doing that

## 2018-02-03 DIAGNOSIS — M79671 Pain in right foot: Secondary | ICD-10-CM | POA: Diagnosis not present

## 2018-02-03 DIAGNOSIS — M2011 Hallux valgus (acquired), right foot: Secondary | ICD-10-CM | POA: Diagnosis not present

## 2018-02-06 DIAGNOSIS — L570 Actinic keratosis: Secondary | ICD-10-CM | POA: Diagnosis not present

## 2018-02-06 DIAGNOSIS — L821 Other seborrheic keratosis: Secondary | ICD-10-CM | POA: Diagnosis not present

## 2018-02-06 DIAGNOSIS — L738 Other specified follicular disorders: Secondary | ICD-10-CM | POA: Diagnosis not present

## 2018-02-06 DIAGNOSIS — D1801 Hemangioma of skin and subcutaneous tissue: Secondary | ICD-10-CM | POA: Diagnosis not present

## 2018-02-06 DIAGNOSIS — Z85828 Personal history of other malignant neoplasm of skin: Secondary | ICD-10-CM | POA: Diagnosis not present

## 2018-02-06 DIAGNOSIS — L720 Epidermal cyst: Secondary | ICD-10-CM | POA: Diagnosis not present

## 2018-02-06 DIAGNOSIS — L565 Disseminated superficial actinic porokeratosis (DSAP): Secondary | ICD-10-CM | POA: Diagnosis not present

## 2018-02-06 DIAGNOSIS — D225 Melanocytic nevi of trunk: Secondary | ICD-10-CM | POA: Diagnosis not present

## 2018-04-01 DIAGNOSIS — H25043 Posterior subcapsular polar age-related cataract, bilateral: Secondary | ICD-10-CM | POA: Diagnosis not present

## 2018-04-01 DIAGNOSIS — H25013 Cortical age-related cataract, bilateral: Secondary | ICD-10-CM | POA: Diagnosis not present

## 2018-04-01 DIAGNOSIS — H2512 Age-related nuclear cataract, left eye: Secondary | ICD-10-CM | POA: Diagnosis not present

## 2018-04-01 DIAGNOSIS — H2513 Age-related nuclear cataract, bilateral: Secondary | ICD-10-CM | POA: Diagnosis not present

## 2018-05-16 DIAGNOSIS — I10 Essential (primary) hypertension: Secondary | ICD-10-CM | POA: Diagnosis not present

## 2018-05-16 DIAGNOSIS — F329 Major depressive disorder, single episode, unspecified: Secondary | ICD-10-CM | POA: Diagnosis not present

## 2018-05-16 DIAGNOSIS — N183 Chronic kidney disease, stage 3 (moderate): Secondary | ICD-10-CM | POA: Diagnosis not present

## 2018-05-16 DIAGNOSIS — E039 Hypothyroidism, unspecified: Secondary | ICD-10-CM | POA: Diagnosis not present

## 2018-05-16 DIAGNOSIS — E782 Mixed hyperlipidemia: Secondary | ICD-10-CM | POA: Diagnosis not present

## 2018-05-16 DIAGNOSIS — E1122 Type 2 diabetes mellitus with diabetic chronic kidney disease: Secondary | ICD-10-CM | POA: Diagnosis not present

## 2018-05-26 DIAGNOSIS — Z961 Presence of intraocular lens: Secondary | ICD-10-CM | POA: Diagnosis not present

## 2018-05-26 DIAGNOSIS — Z9842 Cataract extraction status, left eye: Secondary | ICD-10-CM | POA: Diagnosis not present

## 2018-05-26 DIAGNOSIS — H2512 Age-related nuclear cataract, left eye: Secondary | ICD-10-CM | POA: Diagnosis not present

## 2018-05-27 DIAGNOSIS — H2511 Age-related nuclear cataract, right eye: Secondary | ICD-10-CM | POA: Diagnosis not present

## 2018-06-01 HISTORY — PX: CATARACT EXTRACTION W/ INTRAOCULAR LENS  IMPLANT, BILATERAL: SHX1307

## 2018-06-16 DIAGNOSIS — H25811 Combined forms of age-related cataract, right eye: Secondary | ICD-10-CM | POA: Diagnosis not present

## 2018-06-16 DIAGNOSIS — H2511 Age-related nuclear cataract, right eye: Secondary | ICD-10-CM | POA: Diagnosis not present

## 2018-06-16 DIAGNOSIS — Z961 Presence of intraocular lens: Secondary | ICD-10-CM | POA: Diagnosis not present

## 2018-07-14 DIAGNOSIS — E7800 Pure hypercholesterolemia, unspecified: Secondary | ICD-10-CM | POA: Diagnosis not present

## 2018-07-14 DIAGNOSIS — Z23 Encounter for immunization: Secondary | ICD-10-CM | POA: Diagnosis not present

## 2018-07-14 DIAGNOSIS — Z1389 Encounter for screening for other disorder: Secondary | ICD-10-CM | POA: Diagnosis not present

## 2018-07-14 DIAGNOSIS — F329 Major depressive disorder, single episode, unspecified: Secondary | ICD-10-CM | POA: Diagnosis not present

## 2018-07-14 DIAGNOSIS — F419 Anxiety disorder, unspecified: Secondary | ICD-10-CM | POA: Diagnosis not present

## 2018-07-14 DIAGNOSIS — E039 Hypothyroidism, unspecified: Secondary | ICD-10-CM | POA: Diagnosis not present

## 2018-07-14 DIAGNOSIS — N183 Chronic kidney disease, stage 3 (moderate): Secondary | ICD-10-CM | POA: Diagnosis not present

## 2018-07-14 DIAGNOSIS — I10 Essential (primary) hypertension: Secondary | ICD-10-CM | POA: Diagnosis not present

## 2018-07-14 DIAGNOSIS — Z0000 Encounter for general adult medical examination without abnormal findings: Secondary | ICD-10-CM | POA: Diagnosis not present

## 2018-07-14 DIAGNOSIS — E1122 Type 2 diabetes mellitus with diabetic chronic kidney disease: Secondary | ICD-10-CM | POA: Diagnosis not present

## 2018-07-29 DIAGNOSIS — H1031 Unspecified acute conjunctivitis, right eye: Secondary | ICD-10-CM | POA: Diagnosis not present

## 2018-07-29 DIAGNOSIS — R05 Cough: Secondary | ICD-10-CM | POA: Diagnosis not present

## 2018-07-29 DIAGNOSIS — R490 Dysphonia: Secondary | ICD-10-CM | POA: Diagnosis not present

## 2018-07-29 DIAGNOSIS — J0100 Acute maxillary sinusitis, unspecified: Secondary | ICD-10-CM | POA: Diagnosis not present

## 2018-07-31 ENCOUNTER — Ambulatory Visit: Payer: Medicare Other | Admitting: Physician Assistant

## 2018-08-01 ENCOUNTER — Ambulatory Visit (INDEPENDENT_AMBULATORY_CARE_PROVIDER_SITE_OTHER): Payer: Medicare Other | Admitting: Cardiovascular Disease

## 2018-08-01 ENCOUNTER — Encounter: Payer: Self-pay | Admitting: Cardiovascular Disease

## 2018-08-01 DIAGNOSIS — I10 Essential (primary) hypertension: Secondary | ICD-10-CM

## 2018-08-01 DIAGNOSIS — E7800 Pure hypercholesterolemia, unspecified: Secondary | ICD-10-CM | POA: Diagnosis not present

## 2018-08-01 NOTE — Progress Notes (Signed)
08/01/2018 Desiree Rasmussen   1940-11-27  [id number]  Primary Physician Seward Carol, MD Primary Cardiologist: Lorretta Harp MD [name], [place]  HPI:  Desiree Rasmussen is a 77 y.o. moderately overweight, married Caucasian female, mother of two, grandmother to two grandchildren, whose husband , Desiree Rasmussen, unfortunately passed away on November 27, 2013. He was a patient of mine as well. He apparently died of complications of pneumonia. I last saw her 12 months ago. I last saw her in the office 05/03/2017. She has a history of hypertension and hyperlipidemia, as well as a family history of heart disease. She had chest pain and ruled out myocardial infarction on an ER visit earlier last year, and when I saw her she was complaining of atypical chest pain. A Myoview stress test performed in our office on May 01, 2012, was entirely normal. I thought her pain was more musculoskeletal and/or radicular and her symptoms subsequently resolved. Since I saw her a year ago she's remained fairly stable and is asymptomatic. She apparently needs a right total knee replacement by Dr. Alvan Dame 06/11/17.  She is done well since her total knee replacement a year ago and is scheduled to have her left knee replaced by Dr. Alvan Dame 08/19/2017.  She is been otherwise asymptomatic since I saw her last. Current Meds  Medication Sig  . aspirin EC 81 MG tablet Take 81 mg by mouth daily.  Marland Kitchen atenolol (TENORMIN) 50 MG tablet Take 50 mg by mouth Daily.   Marland Kitchen atorvastatin (LIPITOR) 10 MG tablet Take 10 mg by mouth daily.  . clorazepate (TRANXENE) 7.5 MG tablet Take 7.5 mg by mouth daily as needed.   . meloxicam (MOBIC) 15 MG tablet Take 15 mg by mouth daily.  . Multiple Vitamins-Minerals (MULTIVITAMIN WITH MINERALS) tablet Take 1 tablet by mouth daily.  Marland Kitchen SYNTHROID 100 MCG tablet Take 100 mcg by mouth daily before breakfast.   . triamterene-hydrochlorothiazide (MAXZIDE-25) 37.5-25 MG per tablet Take 0.5 tablets by mouth daily.      Allergies  Allergen Reactions  . Azithromycin Nausea Only and Other (See Comments)    Caused stomach cramping. Anything in Mycin family caused reaction a long time ago.  Loma Sousa [Escitalopram Oxalate] Diarrhea  . Doxycycline Other (See Comments)    Pt unsure  . Ibuprofen Nausea And Vomiting  . Macrobid [Nitrofurantoin] Other (See Comments)    UTI  . Sulfur Rash    All over the body    Social History   Socioeconomic History  . Marital status: Widowed    Spouse name: Not on file  . Number of children: 2  . Years of education: Not on file  . Highest education level: Not on file  Occupational History  . Not on file  Social Needs  . Financial resource strain: Not on file  . Food insecurity:    Worry: Not on file    Inability: Not on file  . Transportation needs:    Medical: Not on file    Non-medical: Not on file  Tobacco Use  . Smoking status: Never Smoker  . Smokeless tobacco: Never Used  Substance and Sexual Activity  . Alcohol use: No  . Drug use: No  . Sexual activity: Not on file  Lifestyle  . Physical activity:    Days per week: Not on file    Minutes per session: Not on file  . Stress: Not on file  Relationships  . Social connections:    Talks on  phone: Not on file    Gets together: Not on file    Attends religious service: Not on file    Active member of club or organization: Not on file    Attends meetings of clubs or organizations: Not on file    Relationship status: Not on file  . Intimate partner violence:    Fear of current or ex partner: Not on file    Emotionally abused: Not on file    Physically abused: Not on file    Forced sexual activity: Not on file  Other Topics Concern  . Not on file  Social History Narrative  . Not on file     Review of Systems: General: negative for chills, fever, night sweats or weight changes.  Cardiovascular: negative for chest pain, dyspnea on exertion, edema, orthopnea, palpitations, paroxysmal nocturnal  dyspnea or shortness of breath Dermatological: negative for rash Respiratory: negative for cough or wheezing Urologic: negative for hematuria Abdominal: negative for nausea, vomiting, diarrhea, bright red blood per rectum, melena, or hematemesis Neurologic: negative for visual changes, syncope, or dizziness All other systems reviewed and are otherwise negative except as noted above.    Blood pressure 128/72, pulse 82, height 5\' 4"  (1.626 m), weight 191 lb 12.8 oz (87 kg).  General appearance: alert and no distress Neck: no adenopathy, no carotid bruit, no JVD, supple, symmetrical, trachea midline and thyroid not enlarged, symmetric, no tenderness/mass/nodules Lungs: clear to auscultation bilaterally Heart: regular rate and rhythm, S1, S2 normal, no murmur, click, rub or gallop Extremities: extremities normal, atraumatic, no cyanosis or edema Pulses: 2+ and symmetric Skin: Skin color, texture, turgor normal. No rashes or lesions Neurologic: Alert and oriented X 3, normal strength and tone. Normal symmetric reflexes. Normal coordination and gait  EKG sinus rhythm 82 with nonspecific ST and T wave changes. I Personally reviewed this EKG.  ASSESSMENT AND PLAN:   Essential hypertension History of essential hypertension her blood pressure measured today 120/72.  She is on atenolol and Maxide.  Continue current meds at current dosing.  Hyperlipidemia History of hyperlipidemia on statin therapy with lipid profile performed 07/14/2018 revealing total cholesterol 213, LDL 94 and HDL 47.      Lorretta Harp MD [facility], [facility] 08/01/2018 1:54 PM

## 2018-08-01 NOTE — Assessment & Plan Note (Signed)
History of hyperlipidemia on statin therapy with lipid profile performed 07/14/2018 revealing total cholesterol 213, LDL 94 and HDL 47.

## 2018-08-01 NOTE — Patient Instructions (Signed)

## 2018-08-01 NOTE — Assessment & Plan Note (Signed)
History of essential hypertension her blood pressure measured today 120/72.  She is on atenolol and Maxide.  Continue current meds at current dosing.

## 2018-08-03 NOTE — H&P (Addendum)
TOTAL KNEE ADMISSION H&P  Patient is being admitted for left total knee arthroplasty.  Subjective:  Chief Complaint:  Left knee primary OA / pain  HPI: Desiree Rasmussen, 77 y.o. female, has a history of pain and functional disability in the left knee due to arthritis and has failed non-surgical conservative treatments for greater than 12 weeks to include NSAID's and/or analgesics, corticosteriod injections, use of assistive devices and activity modification.  Onset of symptoms was gradual, starting  years ago with gradually worsening course since that time. The patient noted prior procedures on the knee to include  arthroplasty on the right knee(s).  Patient currently rates pain in the left knee(s) at 9 out of 10 with activity. Patient has night pain, worsening of pain with activity and weight bearing, pain that interferes with activities of daily living, pain with passive range of motion, crepitus and joint swelling.  Patient has evidence of periarticular osteophytes and joint space narrowing by imaging studies.  There is no active infection.  Risks, benefits and expectations were discussed with the patient.  Risks including but not limited to the risk of anesthesia, blood clots, nerve damage, blood vessel damage, failure of the prosthesis, infection and up to and including death.  Patient understand the risks, benefits and expectations and wishes to proceed with surgery.   PCP: Seward Carol, MD  D/C Plans:       Home   Post-op Meds:       No Rx given  Tranexamic Acid:      To be given - IV   Decadron:      Is to be given  FYI:      ASA  Norco  DME:   Pt already has equipment   PT:   Virtual   Patient Active Problem List   Diagnosis Date Noted  . Obese 06/12/2017  . S/P right TKA 06/11/2017  . Essential hypertension 07/20/2013  . Hyperlipidemia 07/20/2013  . Infected sebaceous cyst 09/28/2011   Past Medical History:  Diagnosis Date  . Arthritis   . Cancer (HCC)    hx of skin  cancer   . Chronic kidney disease   . Cyst    right breast  . Depression   . Family history of heart disease   . History of nuclear stress test 05/2012   normal lexiscan myoview  . Hyperlipidemia   . Hypertension   . Pre-diabetes     Past Surgical History:  Procedure Laterality Date  . [place]  . CYSTECTOMY     sebaceous cyst on breast  . TOTAL KNEE ARTHROPLASTY Right 06/11/2017   Procedure: RIGHT TOTAL KNEE ARTHROPLASTY;  Surgeon: Paralee Cancel, MD;  Location: [facility];  Service: Orthopedics;  Laterality: Right;  70 mins    No current facility-administered medications for this encounter.    Current Outpatient Medications  Medication Sig Dispense Refill Last Dose  . aspirin EC 81 MG tablet Take 81 mg by mouth daily.   Taking  . atenolol (TENORMIN) 50 MG tablet Take 50 mg by mouth Daily.    Taking  . atorvastatin (LIPITOR) 10 MG tablet Take 10 mg by mouth daily.  3 Taking  . clorazepate (TRANXENE) 7.5 MG tablet Take 7.5 mg by mouth daily as needed.    Taking  . meloxicam (MOBIC) 15 MG tablet Take 15 mg by mouth daily.  3 Taking  . Multiple Vitamins-Minerals (MULTIVITAMIN WITH MINERALS) tablet Take 1 tablet by mouth daily.   Taking  .  SYNTHROID 100 MCG tablet Take 100 mcg by mouth daily before breakfast.    Taking  . triamterene-hydrochlorothiazide (MAXZIDE-25) 37.5-25 MG per tablet Take 0.5 tablets by mouth daily.    Taking   Allergies  Allergen Reactions  . Azithromycin Nausea Only and Other (See Comments)    Caused stomach cramping. Anything in Mycin family caused reaction a long time ago.  Loma Sousa [Escitalopram Oxalate] Diarrhea  . Doxycycline Other (See Comments)    Pt unsure  . Ibuprofen Nausea And Vomiting  . Macrobid [Nitrofurantoin] Other (See Comments)    UTI  . Sulfur Rash    All over the body    Social History   Tobacco Use  . Smoking status: Never Smoker  . Smokeless tobacco: Never Used  Substance Use Topics  . Alcohol use: No     Family History  Problem Relation Age of Onset  . Colon cancer Mother 77  . Heart disease Father 33       heart attack  . Breast cancer Maternal Aunt 75  . Colon cancer Maternal Uncle   . Heart disease Brother        CABG at 50     Chronic Kidney disease more specifically classified as CKD stage 3 based on eGFR range  Review of Systems  Constitutional: Negative.   HENT: Negative.   Eyes: Negative.   Respiratory: Negative.   Cardiovascular: Negative.   Gastrointestinal: Negative.   Genitourinary: Negative.   Musculoskeletal: Positive for joint pain.  Skin: Negative.   Neurological: Negative.   Endo/Heme/Allergies: Negative.   Psychiatric/Behavioral: Negative.     Objective:  Physical Exam  Constitutional: She is oriented to person, place, and time. She appears well-developed.  HENT:  Head: Normocephalic.  Eyes: Pupils are equal, round, and reactive to light.  Neck: Neck supple. No JVD present. No tracheal deviation present. No thyromegaly present.  Cardiovascular: Normal rate, regular rhythm and intact distal pulses.  Respiratory: Effort normal and breath sounds normal. No respiratory distress. She has no wheezes.  GI: Soft. There is no tenderness. There is no guarding.  Musculoskeletal:       Left knee: She exhibits decreased range of motion, swelling and bony tenderness. She exhibits no ecchymosis, no deformity, no laceration and no erythema. Tenderness found.  Lymphadenopathy:    She has no cervical adenopathy.  Neurological: She is alert and oriented to person, place, and time.  Skin: Skin is warm and dry.  Psychiatric: She has a normal mood and affect.      Labs:  Estimated body mass index is 32.92 kg/m as calculated from the following:   Height as of 08/01/18: '5\' 4"'$  (1.626 m).   Weight as of 08/01/18: 87 kg.   Imaging Review Plain radiographs demonstrate severe degenerative joint disease of the left knee(s).  The bone quality appears to be good for age  and reported activity level.   Preoperative templating of the joint replacement has been completed, documented, and submitted to the Operating Room personnel in order to optimize intra-operative equipment management.   Anticipated LOS equal to or greater than 2 midnights due to - Age 109 and older with one or more of the following:  - Obesity  - Expected need for hospital services (PT, OT, Nursing) required for safe  discharge    Assessment/Plan:  End stage arthritis, left knee   The patient history, physical examination, clinical judgment of the provider and imaging studies are consistent with end stage degenerative joint disease of the  left knee(s) and total knee arthroplasty is deemed medically necessary. The treatment options including medical management, injection therapy arthroscopy and arthroplasty were discussed at length. The risks and benefits of total knee arthroplasty were presented and reviewed. The risks due to aseptic loosening, infection, stiffness, patella tracking problems, thromboembolic complications and other imponderables were discussed. The patient acknowledged the explanation, agreed to proceed with the plan and consent was signed. Patient is being admitted for inpatient treatment for surgery, pain control, PT, OT, prophylactic antibiotics, VTE prophylaxis, progressive ambulation and ADL's and discharge planning. The patient is planning to be discharged home.    West Pugh Babish   PA-C  08/03/2018, 9:34 PM

## 2018-08-05 DIAGNOSIS — J069 Acute upper respiratory infection, unspecified: Secondary | ICD-10-CM | POA: Diagnosis not present

## 2018-08-14 ENCOUNTER — Encounter (HOSPITAL_COMMUNITY): Payer: Self-pay

## 2018-08-14 ENCOUNTER — Encounter (HOSPITAL_COMMUNITY)
Admission: RE | Admit: 2018-08-14 | Discharge: 2018-08-14 | Disposition: A | Discharge status: Home / Self Care | Payer: Medicare Other | Source: Ambulatory Visit | Attending: Orthopedic Surgery | Admitting: Orthopedic Surgery

## 2018-08-14 ENCOUNTER — Other Ambulatory Visit: Payer: Self-pay

## 2018-08-14 DIAGNOSIS — Z01818 Encounter for other preprocedural examination: Secondary | ICD-10-CM | POA: Diagnosis not present

## 2018-08-14 DIAGNOSIS — M1712 Unilateral primary osteoarthritis, left knee: Secondary | ICD-10-CM | POA: Diagnosis not present

## 2018-08-14 HISTORY — DX: Benign paroxysmal vertigo, left ear: H81.12

## 2018-08-14 HISTORY — DX: Hypothyroidism, unspecified: E03.9

## 2018-08-14 HISTORY — DX: Gastro-esophageal reflux disease without esophagitis: K21.9

## 2018-08-14 HISTORY — DX: Presence of dental prosthetic device (complete) (partial): Z97.2

## 2018-08-14 HISTORY — DX: Presence of spectacles and contact lenses: Z97.3

## 2018-08-14 HISTORY — DX: Unspecified osteoarthritis, unspecified site: M19.90

## 2018-08-14 HISTORY — DX: Type 2 diabetes mellitus without complications: E11.9

## 2018-08-14 LAB — HEMOGLOBIN A1C
Hgb A1c MFr Bld: 7.4 % — ABNORMAL HIGH (ref 4.8–5.6)
Mean Plasma Glucose: 165.68 mg/dL

## 2018-08-14 LAB — CBC
HEMATOCRIT: 45.6 % (ref 36.0–46.0)
HEMOGLOBIN: 14.5 g/dL (ref 12.0–15.0)
MCH: 30.7 pg (ref 26.0–34.0)
MCHC: 31.8 g/dL (ref 30.0–36.0)
MCV: 96.6 fL (ref 80.0–100.0)
Platelets: 265 10*3/uL (ref 150–400)
RBC: 4.72 MIL/uL (ref 3.87–5.11)
RDW: 13.2 % (ref 11.5–15.5)
WBC: 9.3 10*3/uL (ref 4.0–10.5)
nRBC: 0 % (ref 0.0–0.2)

## 2018-08-14 LAB — BASIC METABOLIC PANEL
Anion gap: 12 (ref 5–15)
BUN: 21 mg/dL (ref 8–23)
CHLORIDE: 102 mmol/L (ref 98–111)
CO2: 26 mmol/L (ref 22–32)
CREATININE: 1.18 mg/dL — AB (ref 0.44–1.00)
Calcium: 9.7 mg/dL (ref 8.9–10.3)
GFR calc Af Amer: 51 mL/min — ABNORMAL LOW (ref >60)
GFR calc non Af Amer: 44 mL/min — ABNORMAL LOW (ref >60)
GLUCOSE: 135 mg/dL — AB (ref 70–99)
Potassium: 4.1 mmol/L (ref 3.5–5.1)
Sodium: 140 mmol/L (ref 135–145)

## 2018-08-14 LAB — SURGICAL PCR SCREEN
MRSA, PCR: NEGATIVE
Staphylococcus aureus: NEGATIVE

## 2018-08-14 LAB — GLUCOSE, CAPILLARY: GLUCOSE-CAPILLARY: 132 mg/dL — AB (ref 70–99)

## 2018-08-14 NOTE — Patient Instructions (Signed)
Desiree Rasmussen  [birth year]    Your procedure is scheduled on:  08-19-2018   Report to [facility] Main  Entrance,  Report to admitting at  10:45 AM     Call this number if you have problems the morning of surgery [phone]       Remember: Do not eat food after Midnight.  Have clear liquid diet from midnight until 7:00 AM day of surgery.  After 7:00 AM nothing by mouth including                                      Water,candy,gum,mints.                                      BRUSH YOUR TEETH MORNING OF SURGERY AND RINSE YOUR MOUTH OUT.        Take these medicines the morning of surgery with A SIP OF WATER:   Atenolol, Atovastatin, Levothyroxine, Omeprazole, eye drop as usual                       DO NOT TAKE ANY DIABETIC MEDICATIONS DAY OF YOUR SURGERY                                   You may not have any metal on your body including hair pins and piercings              Do not wear jewelry, make-up, lotions, powders or perfumes, deodorant              Do not wear nail polish.  Do not shave  48 hours prior to surgery.                   Do not bring valuables to the hospital. [place].  Contacts, dentures or bridgework may not be worn into surgery.  Leave suitcase in the car. After surgery it may be brought to your room.     _____________________________________________________________________      CLEAR LIQUID DIET   Foods Allowed                                                                     Foods Excluded  Coffee and tea, regular and decaf                             liquids that you cannot  Plain Jell-O in any flavor                                             see through such as: Fruit ices (not with fruit  pulp)                                     milk, soups, orange juice  Iced Popsicles                                    All solid food Carbonated beverages,  regular and diet                                    Cranberry, grape and apple juices Sports drinks like Gatorade Lightly seasoned clear broth or consume(fat free) Sugar, honey syrup  Sample Menu Breakfast                                Lunch                                     Supper Cranberry juice                    Beef broth                            Chicken broth Jell-O                                     Grape juice                           Apple juice Coffee or tea                        Jell-O                                      Popsicle                                                Coffee or tea                        Coffee or tea  _____________________________________________________________________            [facility] - Preparing for Surgery Before surgery, you can play an important role.  Because skin is not sterile, your skin needs to be as free of germs as possible.  You can reduce the number of germs on your skin by washing with CHG (chlorahexidine gluconate) soap before surgery.  CHG is an antiseptic cleaner which kills germs and bonds with the skin to continue killing germs even after washing. Please DO NOT use if you have an allergy to CHG or antibacterial soaps.  If your skin becomes reddened/irritated stop using the CHG and inform your nurse when you arrive at Short Stay. Do not shave (including legs and  underarms) for at least 48 hours prior to the first CHG shower.  You may shave your face/neck. Please follow these instructions carefully:  1.  Shower with CHG Soap the night before surgery and the  morning of Surgery.  2.  If you choose to wash your hair, wash your hair first as usual with your  normal  shampoo.  3.  After you shampoo, rinse your hair and body thoroughly to remove the  shampoo.                            4.  Use CHG as you would any other liquid soap.  You can apply chg directly  to the skin and wash                       Gently with a scrungie  or clean washcloth.  5.  Apply the CHG Soap to your body ONLY FROM THE NECK DOWN.   Do not use on face/ open                           Wound or open sores. Avoid contact with eyes, ears mouth and genitals (private parts).                       Wash face,  Genitals (private parts) with your normal soap.             6.  Wash thoroughly, paying special attention to the area where your surgery  will be performed.  7.  Thoroughly rinse your body with warm water from the neck down.  8.  DO NOT shower/wash with your normal soap after using and rinsing off  the CHG Soap.             9.  Pat yourself dry with a clean towel.            10.  Wear clean pajamas.            11.  Place clean sheets on your bed the night of your first shower and do not  sleep with pets. Day of Surgery : Do not apply any lotions/deodorants the morning of surgery.  Please wear clean clothes to the hospital/surgery center.  FAILURE TO FOLLOW THESE INSTRUCTIONS MAY RESULT IN THE CANCELLATION OF YOUR SURGERY PATIENT SIGNATURE_________________________________  NURSE SIGNATURE__________________________________  ________________________________________________________________________   Desiree Rasmussen  An incentive spirometer is a tool that can help keep your lungs clear and active. This tool measures how well you are filling your lungs with each breath. Taking long deep breaths may help reverse or decrease the chance of developing breathing (pulmonary) problems (especially infection) following:  A long period of time when you are unable to move or be active. BEFORE THE PROCEDURE   If the spirometer includes an indicator to show your best effort, your nurse or respiratory therapist will set it to a desired goal.  If possible, sit up straight or lean slightly forward. Try not to slouch.  Hold the incentive spirometer in an upright position. INSTRUCTIONS FOR USE  1. Sit on the edge of your bed if possible, or sit up as  far as you can in bed or on a chair. 2. Hold the incentive spirometer in an upright position. 3. Breathe out normally. 4. Place the mouthpiece in your mouth and seal your lips tightly  around it. 5. Breathe in slowly and as deeply as possible, raising the piston or the ball toward the top of the column. 6. Hold your breath for 3-5 seconds or for as long as possible. Allow the piston or ball to fall to the bottom of the column. 7. Remove the mouthpiece from your mouth and breathe out normally. 8. Rest for a few seconds and repeat Steps 1 through 7 at least 10 times every 1-2 hours when you are awake. Take your time and take a few normal breaths between deep breaths. 9. The spirometer may include an indicator to show your best effort. Use the indicator as a goal to work toward during each repetition. 10. After each set of 10 deep breaths, practice coughing to be sure your lungs are clear. If you have an incision (the cut made at the time of surgery), support your incision when coughing by placing a pillow or rolled up towels firmly against it. Once you are able to get out of bed, walk around indoors and cough well. You may stop using the incentive spirometer when instructed by your caregiver.  RISKS AND COMPLICATIONS  Take your time so you do not get dizzy or light-headed.  If you are in pain, you may need to take or ask for pain medication before doing incentive spirometry. It is harder to take a deep breath if you are having pain. AFTER USE  Rest and breathe slowly and easily.  It can be helpful to keep track of a log of your progress. Your caregiver can provide you with a simple table to help with this. If you are using the spirometer at home, follow these instructions: [place] IF:   You are having difficultly using the spirometer.  You have trouble using the spirometer as often as instructed.  Your pain medication is not giving enough relief while using the spirometer.  You  develop fever of 100.5 F (38.1 C) or higher. SEEK IMMEDIATE MEDICAL CARE IF:   You cough up bloody sputum that had not been present before.  You develop fever of 102 F (38.9 C) or greater.  You develop worsening pain at or near the incision site. MAKE SURE YOU:   Understand these instructions.  Will watch your condition.  Will get help right away if you are not doing well or get worse. Document Released: 01/28/2007 Document Revised: 12/10/2011 Document Reviewed: 03/31/2007 ExitCare Patient Information 2014 ExitCare, [place].   ________________________________________________________________________  WHAT IS A BLOOD TRANSFUSION? Blood Transfusion Information  A transfusion is the replacement of blood or some of its parts. Blood is made up of multiple cells which provide different functions.  Red blood cells carry oxygen and are used for blood loss replacement.  White blood cells fight against infection.  Platelets control bleeding.  Plasma helps clot blood.  Other blood products are available for specialized needs, such as hemophilia or other clotting disorders. BEFORE THE TRANSFUSION  Who gives blood for transfusions?   Healthy volunteers who are fully evaluated to make sure their blood is safe. This is blood bank blood. Transfusion therapy is the safest it has ever been in the practice of medicine. Before blood is taken from a donor, a complete history is taken to make sure that person has no history of diseases nor engages in risky social behavior (examples are intravenous drug use or sexual activity with multiple partners). The donor's travel history is screened to minimize risk of transmitting infections, such as malaria. The donated  blood is tested for signs of infectious diseases, such as HIV and hepatitis. The blood is then tested to be sure it is compatible with you in order to minimize the chance of a transfusion reaction. If you or a relative donates blood, this is  often done in anticipation of surgery and is not appropriate for emergency situations. It takes many days to process the donated blood. RISKS AND COMPLICATIONS Although transfusion therapy is very safe and saves many lives, the main dangers of transfusion include:   Getting an infectious disease.  Developing a transfusion reaction. This is an allergic reaction to something in the blood you were given. Every precaution is taken to prevent this. The decision to have a blood transfusion has been considered carefully by your caregiver before blood is given. Blood is not given unless the benefits outweigh the risks. AFTER THE TRANSFUSION  Right after receiving a blood transfusion, you will usually feel much better and more energetic. This is especially true if your red blood cells have gotten low (anemic). The transfusion raises the level of the red blood cells which carry oxygen, and this usually causes an energy increase.  The nurse administering the transfusion will monitor you carefully for complications. HOME CARE INSTRUCTIONS  No special instructions are needed after a transfusion. You may find your energy is better. Speak with your caregiver about any limitations on activity for underlying diseases you may have. SEEK MEDICAL CARE IF:   Your condition is not improving after your transfusion.  You develop redness or irritation at the intravenous (IV) site. SEEK IMMEDIATE MEDICAL CARE IF:  Any of the following symptoms occur over the next 12 hours:  Shaking chills.  You have a temperature by mouth above 102 F (38.9 C), not controlled by medicine.  Chest, back, or muscle pain.  People around you feel you are not acting correctly or are confused.  Shortness of breath or difficulty breathing.  Dizziness and fainting.  You get a rash or develop hives.  You have a decrease in urine output.  Your urine turns a dark color or changes to pink, red, or brown. Any of the following  symptoms occur over the next 10 days:  You have a temperature by mouth above 102 F (38.9 C), not controlled by medicine.  Shortness of breath.  Weakness after normal activity.  The white part of the eye turns yellow (jaundice).  You have a decrease in the amount of urine or are urinating less often.  Your urine turns a dark color or changes to pink, red, or brown. Document Released: 09/14/2000 Document Revised: 12/10/2011 Document Reviewed: 05/03/2008 [facility] Patient Information 2014 [place], [place].  _______________________________________________________________________

## 2018-08-14 NOTE — Progress Notes (Signed)
EKG dated 08-01-2018 in epic.  Cardiology clearance dated 08-01-2018, dr berry in with chart.  LOV note dated 08-01-2018, dr berry in epic.  Pt pcp surgical clearance dated 07-31-2018, dr polite, with chart.

## 2018-08-18 NOTE — Anesthesia Preprocedure Evaluation (Addendum)
Anesthesia Evaluation  Patient identified by MRN, date of birth, ID band Patient awake    Reviewed: Allergy & Precautions, NPO status , Patient's Chart, lab work & pertinent test results  History of Anesthesia Complications Negative for: history of anesthetic complications  Airway Mallampati: II  TM Distance: >3 FB Neck ROM: Full    Dental no notable dental hx. (+) Partial Upper, Dental Advisory Given   Pulmonary neg pulmonary ROS,    Pulmonary exam normal breath sounds clear to auscultation       Cardiovascular hypertension, Pt. on home beta blockers and Pt. on medications negative cardio ROS Normal cardiovascular exam Rhythm:Regular Rate:Normal     Neuro/Psych Depression negative neurological ROS     GI/Hepatic Neg liver ROS, GERD  Controlled,  Endo/Other  diabetes, Type 2, Oral Hypoglycemic AgentsHypothyroidism   Renal/GU negative Renal ROS     Musculoskeletal  (+) Arthritis , Osteoarthritis,    Abdominal   Peds  Hematology negative hematology ROS (+)   Anesthesia Other Findings Day of surgery medications reviewed with the patient.  Reproductive/Obstetrics                            Anesthesia Physical Anesthesia Plan  ASA: II  Anesthesia Plan: Spinal   Post-op Pain Management:  Regional for Post-op pain   Induction:   PONV Risk Score and Plan: 2 and Treatment may vary due to age or medical condition, Ondansetron and Propofol infusion  Airway Management Planned: Nasal Cannula and Natural Airway  Additional Equipment:   Intra-op Plan:   Post-operative Plan:   Informed Consent: I have reviewed the patients History and Physical, chart, labs and discussed the procedure including the risks, benefits and alternatives for the proposed anesthesia with the patient or authorized representative who has indicated his/her understanding and acceptance.   Dental advisory given  Plan  Discussed with: CRNA  Anesthesia Plan Comments: (Spinal + adductor canal block)       Anesthesia Quick Evaluation

## 2018-08-19 ENCOUNTER — Encounter (HOSPITAL_COMMUNITY): Payer: Self-pay | Admitting: Anesthesiology

## 2018-08-19 ENCOUNTER — Encounter (HOSPITAL_COMMUNITY)
Admission: RE | Disposition: A | Discharge status: Home / Self Care | Payer: Self-pay | Source: Home / Self Care | Attending: Orthopedic Surgery

## 2018-08-19 ENCOUNTER — Inpatient Hospital Stay (HOSPITAL_COMMUNITY): Payer: Medicare Other | Admitting: Anesthesiology

## 2018-08-19 ENCOUNTER — Other Ambulatory Visit: Payer: Self-pay

## 2018-08-19 ENCOUNTER — Other Ambulatory Visit: Payer: Self-pay | Admitting: Orthopedic Surgery

## 2018-08-19 ENCOUNTER — Inpatient Hospital Stay (HOSPITAL_COMMUNITY)
Admission: RE | Admit: 2018-08-19 | Discharge: 2018-08-21 | DRG: 470 | Disposition: A | Discharge status: Home / Self Care | Payer: Medicare Other | Attending: Orthopedic Surgery | Admitting: Orthopedic Surgery

## 2018-08-19 DIAGNOSIS — Z8249 Family history of ischemic heart disease and other diseases of the circulatory system: Secondary | ICD-10-CM | POA: Diagnosis not present

## 2018-08-19 DIAGNOSIS — Z881 Allergy status to other antibiotic agents status: Secondary | ICD-10-CM

## 2018-08-19 DIAGNOSIS — M25762 Osteophyte, left knee: Secondary | ICD-10-CM | POA: Diagnosis not present

## 2018-08-19 DIAGNOSIS — E785 Hyperlipidemia, unspecified: Secondary | ICD-10-CM | POA: Diagnosis not present

## 2018-08-19 DIAGNOSIS — Z6832 Body mass index (BMI) 32.0-32.9, adult: Secondary | ICD-10-CM

## 2018-08-19 DIAGNOSIS — Z79899 Other long term (current) drug therapy: Secondary | ICD-10-CM

## 2018-08-19 DIAGNOSIS — Z7982 Long term (current) use of aspirin: Secondary | ICD-10-CM

## 2018-08-19 DIAGNOSIS — G8918 Other acute postprocedural pain: Secondary | ICD-10-CM | POA: Diagnosis not present

## 2018-08-19 DIAGNOSIS — I129 Hypertensive chronic kidney disease with stage 1 through stage 4 chronic kidney disease, or unspecified chronic kidney disease: Secondary | ICD-10-CM | POA: Diagnosis present

## 2018-08-19 DIAGNOSIS — E1122 Type 2 diabetes mellitus with diabetic chronic kidney disease: Secondary | ICD-10-CM | POA: Diagnosis not present

## 2018-08-19 DIAGNOSIS — E039 Hypothyroidism, unspecified: Secondary | ICD-10-CM | POA: Diagnosis present

## 2018-08-19 DIAGNOSIS — Z973 Presence of spectacles and contact lenses: Secondary | ICD-10-CM

## 2018-08-19 DIAGNOSIS — Z972 Presence of dental prosthetic device (complete) (partial): Secondary | ICD-10-CM

## 2018-08-19 DIAGNOSIS — Z79890 Hormone replacement therapy (postmenopausal): Secondary | ICD-10-CM

## 2018-08-19 DIAGNOSIS — F329 Major depressive disorder, single episode, unspecified: Secondary | ICD-10-CM | POA: Diagnosis present

## 2018-08-19 DIAGNOSIS — Z96651 Presence of right artificial knee joint: Secondary | ICD-10-CM | POA: Diagnosis present

## 2018-08-19 DIAGNOSIS — K219 Gastro-esophageal reflux disease without esophagitis: Secondary | ICD-10-CM | POA: Diagnosis present

## 2018-08-19 DIAGNOSIS — I10 Essential (primary) hypertension: Secondary | ICD-10-CM | POA: Diagnosis not present

## 2018-08-19 DIAGNOSIS — N183 Chronic kidney disease, stage 3 (moderate): Secondary | ICD-10-CM | POA: Diagnosis present

## 2018-08-19 DIAGNOSIS — Z886 Allergy status to analgesic agent status: Secondary | ICD-10-CM

## 2018-08-19 DIAGNOSIS — M1712 Unilateral primary osteoarthritis, left knee: Secondary | ICD-10-CM | POA: Diagnosis not present

## 2018-08-19 DIAGNOSIS — Z803 Family history of malignant neoplasm of breast: Secondary | ICD-10-CM

## 2018-08-19 DIAGNOSIS — Z96652 Presence of left artificial knee joint: Secondary | ICD-10-CM

## 2018-08-19 DIAGNOSIS — Z96659 Presence of unspecified artificial knee joint: Secondary | ICD-10-CM

## 2018-08-19 DIAGNOSIS — Z888 Allergy status to other drugs, medicaments and biological substances status: Secondary | ICD-10-CM

## 2018-08-19 DIAGNOSIS — Z882 Allergy status to sulfonamides status: Secondary | ICD-10-CM

## 2018-08-19 DIAGNOSIS — E669 Obesity, unspecified: Secondary | ICD-10-CM | POA: Diagnosis present

## 2018-08-19 DIAGNOSIS — Z85828 Personal history of other malignant neoplasm of skin: Secondary | ICD-10-CM

## 2018-08-19 HISTORY — PX: TOTAL KNEE ARTHROPLASTY: SHX125

## 2018-08-19 LAB — TYPE AND SCREEN
ABO/RH(D): O POS
ANTIBODY SCREEN: NEGATIVE

## 2018-08-19 LAB — GLUCOSE, CAPILLARY
GLUCOSE-CAPILLARY: 223 mg/dL — AB (ref 70–99)
GLUCOSE-CAPILLARY: 311 mg/dL — AB (ref 70–99)
Glucose-Capillary: 139 mg/dL — ABNORMAL HIGH (ref 70–99)

## 2018-08-19 SURGERY — ARTHROPLASTY, KNEE, TOTAL
Anesthesia: Spinal | Laterality: Left

## 2018-08-19 MED ORDER — FERROUS SULFATE 325 (65 FE) MG PO TABS: 325.0000 mg | ORAL_TABLET | Freq: Three times a day (TID) | ORAL | 3 refills | Status: DC

## 2018-08-19 MED ORDER — HYDROMORPHONE HCL 1 MG/ML IJ SOLN: 0.5000 mg | INTRAMUSCULAR | Status: DC | PRN

## 2018-08-19 MED ORDER — CHLORHEXIDINE GLUCONATE 4 % EX LIQD: 60.0000 mL | Freq: Once | CUTANEOUS | Status: DC

## 2018-08-19 MED ORDER — CEFAZOLIN SODIUM-DEXTROSE 2-4 GM/100ML-% IV SOLN
2.0000 g | Freq: Four times a day (QID) | INTRAVENOUS | Status: AC
  Administered 2018-08-19 – 2018-08-20 (×2): 2 g via INTRAVENOUS
  Filled 2018-08-19 (×2): qty 100

## 2018-08-19 MED ORDER — BUPIVACAINE-EPINEPHRINE (PF) 0.5% -1:200000 IJ SOLN
INTRAMUSCULAR | Status: DC | PRN
  Administered 2018-08-19: 20 mL via PERINEURAL

## 2018-08-19 MED ORDER — ATORVASTATIN CALCIUM 10 MG PO TABS
10.0000 mg | ORAL_TABLET | Freq: Every morning | ORAL | Status: DC
  Administered 2018-08-20 – 2018-08-21 (×2): 10 mg via ORAL
  Filled 2018-08-19 (×2): qty 1

## 2018-08-19 MED ORDER — DEXAMETHASONE SODIUM PHOSPHATE 10 MG/ML IJ SOLN
10.0000 mg | Freq: Once | INTRAMUSCULAR | Status: AC
  Administered 2018-08-20: 10 mg via INTRAVENOUS
  Filled 2018-08-19: qty 1

## 2018-08-19 MED ORDER — METOCLOPRAMIDE HCL 5 MG PO TABS: 5.0000 mg | ORAL_TABLET | Freq: Three times a day (TID) | ORAL | Status: DC | PRN

## 2018-08-19 MED ORDER — MAGNESIUM CITRATE PO SOLN: 1.0000 | Freq: Once | ORAL | Status: DC | PRN

## 2018-08-19 MED ORDER — OXYCODONE HCL 5 MG PO TABS: 5.0000 mg | ORAL_TABLET | Freq: Once | ORAL | Status: DC | PRN

## 2018-08-19 MED ORDER — METHOCARBAMOL 500 MG IVPB - SIMPLE MED
500.0000 mg | Freq: Four times a day (QID) | INTRAVENOUS | Status: DC | PRN
  Filled 2018-08-19: qty 50

## 2018-08-19 MED ORDER — INSULIN ASPART 100 UNIT/ML ~~LOC~~ SOLN
0.0000 [IU] | Freq: Three times a day (TID) | SUBCUTANEOUS | Status: DC
  Administered 2018-08-19: 11 [IU] via SUBCUTANEOUS
  Administered 2018-08-20: 3 [IU] via SUBCUTANEOUS
  Administered 2018-08-20: 5 [IU] via SUBCUTANEOUS
  Administered 2018-08-20: 3 [IU] via SUBCUTANEOUS
  Administered 2018-08-20: 5 [IU] via SUBCUTANEOUS

## 2018-08-19 MED ORDER — PROPOFOL 10 MG/ML IV BOLUS
INTRAVENOUS | Status: AC
  Filled 2018-08-19: qty 40

## 2018-08-19 MED ORDER — ONDANSETRON HCL 4 MG PO TABS: 4.0000 mg | ORAL_TABLET | Freq: Four times a day (QID) | ORAL | Status: DC | PRN

## 2018-08-19 MED ORDER — LIDOCAINE 2% (20 MG/ML) 5 ML SYRINGE
INTRAMUSCULAR | Status: AC
  Filled 2018-08-19: qty 5

## 2018-08-19 MED ORDER — SODIUM CHLORIDE 0.9 % IV SOLN
INTRAVENOUS | Status: DC
  Administered 2018-08-19 – 2018-08-20 (×2): via INTRAVENOUS

## 2018-08-19 MED ORDER — METFORMIN HCL 500 MG PO TABS
500.0000 mg | ORAL_TABLET | Freq: Every day | ORAL | Status: DC
  Administered 2018-08-20: 500 mg via ORAL
  Filled 2018-08-19: qty 1

## 2018-08-19 MED ORDER — INSULIN ASPART 100 UNIT/ML ~~LOC~~ SOLN: 0.0000 [IU] | Freq: Three times a day (TID) | SUBCUTANEOUS | Status: DC

## 2018-08-19 MED ORDER — ACETAMINOPHEN 10 MG/ML IV SOLN: 1000.0000 mg | Freq: Once | INTRAVENOUS | Status: DC | PRN

## 2018-08-19 MED ORDER — KETOROLAC TROMETHAMINE 30 MG/ML IJ SOLN
INTRAMUSCULAR | Status: AC
  Filled 2018-08-19: qty 1

## 2018-08-19 MED ORDER — OXYCODONE HCL 5 MG/5ML PO SOLN
5.0000 mg | Freq: Once | ORAL | Status: DC | PRN
  Filled 2018-08-19: qty 5

## 2018-08-19 MED ORDER — SODIUM CHLORIDE (PF) 0.9 % IJ SOLN
INTRAMUSCULAR | Status: DC | PRN
  Administered 2018-08-19: 30 mL

## 2018-08-19 MED ORDER — LIDOCAINE 2% (20 MG/ML) 5 ML SYRINGE
INTRAMUSCULAR | Status: DC | PRN
  Administered 2018-08-19: 60 mg via INTRAVENOUS

## 2018-08-19 MED ORDER — ALUM & MAG HYDROXIDE-SIMETH 200-200-20 MG/5ML PO SUSP: 15.0000 mL | ORAL | Status: DC | PRN

## 2018-08-19 MED ORDER — BISACODYL 10 MG RE SUPP: 10.0000 mg | Freq: Every day | RECTAL | Status: DC | PRN

## 2018-08-19 MED ORDER — LEVOTHYROXINE SODIUM 100 MCG PO TABS
100.0000 ug | ORAL_TABLET | Freq: Every day | ORAL | Status: DC
  Administered 2018-08-20 – 2018-08-21 (×2): 100 ug via ORAL
  Filled 2018-08-19 (×2): qty 1

## 2018-08-19 MED ORDER — DEXAMETHASONE SODIUM PHOSPHATE 10 MG/ML IJ SOLN
10.0000 mg | Freq: Once | INTRAMUSCULAR | Status: AC
  Administered 2018-08-19: 10 mg via INTRAVENOUS

## 2018-08-19 MED ORDER — FERROUS SULFATE 325 (65 FE) MG PO TABS
325.0000 mg | ORAL_TABLET | Freq: Two times a day (BID) | ORAL | Status: DC
  Administered 2018-08-20 – 2018-08-21 (×3): 325 mg via ORAL
  Filled 2018-08-19 (×3): qty 1

## 2018-08-19 MED ORDER — LACTATED RINGERS IV SOLN
INTRAVENOUS | Status: DC
  Administered 2018-08-19: 1000 mL via INTRAVENOUS
  Administered 2018-08-19: 14:00:00 via INTRAVENOUS

## 2018-08-19 MED ORDER — PROPOFOL 10 MG/ML IV BOLUS
INTRAVENOUS | Status: DC | PRN
  Administered 2018-08-19: 10 mg via INTRAVENOUS
  Administered 2018-08-19 (×2): 20 mg via INTRAVENOUS

## 2018-08-19 MED ORDER — POLYETHYLENE GLYCOL 3350 17 G PO PACK
17.0000 g | PACK | Freq: Two times a day (BID) | ORAL | Status: DC
  Administered 2018-08-19 – 2018-08-21 (×3): 17 g via ORAL
  Filled 2018-08-19 (×3): qty 1

## 2018-08-19 MED ORDER — METHOCARBAMOL 500 MG PO TABS: 500.0000 mg | ORAL_TABLET | Freq: Four times a day (QID) | ORAL | 0 refills | Status: DC | PRN

## 2018-08-19 MED ORDER — TRANEXAMIC ACID-NACL 1000-0.7 MG/100ML-% IV SOLN
1000.0000 mg | Freq: Once | INTRAVENOUS | Status: AC
  Administered 2018-08-19: 1000 mg via INTRAVENOUS
  Filled 2018-08-19: qty 100

## 2018-08-19 MED ORDER — DOCUSATE SODIUM 100 MG PO CAPS
100.0000 mg | ORAL_CAPSULE | Freq: Two times a day (BID) | ORAL | Status: DC
  Administered 2018-08-19 – 2018-08-21 (×4): 100 mg via ORAL
  Filled 2018-08-19 (×4): qty 1

## 2018-08-19 MED ORDER — STERILE WATER FOR IRRIGATION IR SOLN
Status: DC | PRN
  Administered 2018-08-19: 2000 mL

## 2018-08-19 MED ORDER — FENTANYL CITRATE (PF) 100 MCG/2ML IJ SOLN
INTRAMUSCULAR | Status: AC
  Administered 2018-08-19: 50 ug
  Filled 2018-08-19: qty 2

## 2018-08-19 MED ORDER — MIDAZOLAM HCL 2 MG/2ML IJ SOLN
INTRAMUSCULAR | Status: AC
  Administered 2018-08-19: 1 mg
  Filled 2018-08-19: qty 2

## 2018-08-19 MED ORDER — CEFAZOLIN SODIUM-DEXTROSE 2-4 GM/100ML-% IV SOLN
INTRAVENOUS | Status: AC
  Filled 2018-08-19: qty 100

## 2018-08-19 MED ORDER — PROPOFOL 500 MG/50ML IV EMUL
INTRAVENOUS | Status: DC | PRN
  Administered 2018-08-19: 75 ug/kg/min via INTRAVENOUS

## 2018-08-19 MED ORDER — CEFAZOLIN SODIUM-DEXTROSE 2-4 GM/100ML-% IV SOLN
2.0000 g | INTRAVENOUS | Status: AC
  Administered 2018-08-19: 2 g via INTRAVENOUS

## 2018-08-19 MED ORDER — BUPIVACAINE-EPINEPHRINE (PF) 0.25% -1:200000 IJ SOLN
INTRAMUSCULAR | Status: DC | PRN
  Administered 2018-08-19: 30 mL

## 2018-08-19 MED ORDER — ACETAMINOPHEN 325 MG PO TABS: 325.0000 mg | ORAL_TABLET | Freq: Four times a day (QID) | ORAL | Status: DC | PRN

## 2018-08-19 MED ORDER — FENTANYL CITRATE (PF) 100 MCG/2ML IJ SOLN: 100.0000 ug | Freq: Once | INTRAMUSCULAR | Status: DC

## 2018-08-19 MED ORDER — CLORAZEPATE DIPOTASSIUM 7.5 MG PO TABS: 7.5000 mg | ORAL_TABLET | Freq: Every day | ORAL | Status: DC | PRN

## 2018-08-19 MED ORDER — BUPIVACAINE HCL (PF) 0.75 % IJ SOLN
INTRAMUSCULAR | Status: DC | PRN
  Administered 2018-08-19: 1.8 mL

## 2018-08-19 MED ORDER — TRIAMTERENE-HCTZ 37.5-25 MG PO TABS
0.5000 | ORAL_TABLET | Freq: Every day | ORAL | Status: DC
  Administered 2018-08-19 – 2018-08-21 (×3): 0.5 via ORAL
  Filled 2018-08-19 (×3): qty 1

## 2018-08-19 MED ORDER — ONDANSETRON HCL 4 MG/2ML IJ SOLN
INTRAMUSCULAR | Status: DC | PRN
  Administered 2018-08-19: 4 mg via INTRAVENOUS

## 2018-08-19 MED ORDER — ONDANSETRON HCL 4 MG/2ML IJ SOLN: 4.0000 mg | Freq: Four times a day (QID) | INTRAMUSCULAR | Status: DC | PRN

## 2018-08-19 MED ORDER — SODIUM CHLORIDE (PF) 0.9 % IJ SOLN
INTRAMUSCULAR | Status: AC
  Filled 2018-08-19: qty 50

## 2018-08-19 MED ORDER — MENTHOL 3 MG MT LOZG: 1.0000 | LOZENGE | OROMUCOSAL | Status: DC | PRN

## 2018-08-19 MED ORDER — ASPIRIN 81 MG PO CHEW: 81.0000 mg | CHEWABLE_TABLET | Freq: Two times a day (BID) | ORAL | 0 refills | Status: AC

## 2018-08-19 MED ORDER — TRANEXAMIC ACID-NACL 1000-0.7 MG/100ML-% IV SOLN
INTRAVENOUS | Status: AC
  Filled 2018-08-19: qty 100

## 2018-08-19 MED ORDER — HYDROCODONE-ACETAMINOPHEN 7.5-325 MG PO TABS: 1.0000 | ORAL_TABLET | ORAL | 0 refills | Status: DC | PRN

## 2018-08-19 MED ORDER — DIPHENHYDRAMINE HCL 12.5 MG/5ML PO ELIX: 12.5000 mg | ORAL_SOLUTION | ORAL | Status: DC | PRN

## 2018-08-19 MED ORDER — SODIUM CHLORIDE 0.9 % IR SOLN
Status: DC | PRN
  Administered 2018-08-19: 1000 mL

## 2018-08-19 MED ORDER — METFORMIN HCL 500 MG PO TABS: 1000.0000 mg | ORAL_TABLET | Freq: Every day | ORAL | Status: DC

## 2018-08-19 MED ORDER — PROMETHAZINE HCL 25 MG/ML IJ SOLN: 6.2500 mg | INTRAMUSCULAR | Status: DC | PRN

## 2018-08-19 MED ORDER — KETOROLAC TROMETHAMINE 30 MG/ML IJ SOLN
INTRAMUSCULAR | Status: DC | PRN
  Administered 2018-08-19: 30 mg

## 2018-08-19 MED ORDER — METHOCARBAMOL 500 MG PO TABS
500.0000 mg | ORAL_TABLET | Freq: Four times a day (QID) | ORAL | Status: DC | PRN
  Administered 2018-08-19 – 2018-08-21 (×4): 500 mg via ORAL
  Filled 2018-08-19 (×4): qty 1

## 2018-08-19 MED ORDER — FENTANYL CITRATE (PF) 100 MCG/2ML IJ SOLN: 25.0000 ug | INTRAMUSCULAR | Status: DC | PRN

## 2018-08-19 MED ORDER — METFORMIN HCL 500 MG PO TABS: 500.0000 mg | ORAL_TABLET | Freq: Every day | ORAL | Status: DC

## 2018-08-19 MED ORDER — HYDROCODONE-ACETAMINOPHEN 5-325 MG PO TABS
1.0000 | ORAL_TABLET | ORAL | Status: DC | PRN
  Administered 2018-08-19: 2 via ORAL
  Administered 2018-08-19 – 2018-08-20 (×4): 1 via ORAL
  Administered 2018-08-20: 2 via ORAL
  Administered 2018-08-20 (×2): 1 via ORAL
  Administered 2018-08-21 (×3): 2 via ORAL
  Filled 2018-08-19: qty 1
  Filled 2018-08-19: qty 2
  Filled 2018-08-19: qty 1
  Filled 2018-08-19 (×4): qty 2
  Filled 2018-08-19 (×4): qty 1

## 2018-08-19 MED ORDER — MIDAZOLAM HCL 2 MG/2ML IJ SOLN: 2.0000 mg | Freq: Once | INTRAMUSCULAR | Status: DC

## 2018-08-19 MED ORDER — POLYETHYLENE GLYCOL 3350 17 G PO PACK: 17.0000 g | PACK | Freq: Two times a day (BID) | ORAL | 0 refills | Status: DC

## 2018-08-19 MED ORDER — PHENOL 1.4 % MT LIQD
1.0000 | OROMUCOSAL | Status: DC | PRN
  Filled 2018-08-19: qty 177

## 2018-08-19 MED ORDER — 0.9 % SODIUM CHLORIDE (POUR BTL) OPTIME
TOPICAL | Status: DC | PRN
  Administered 2018-08-19: 1000 mL

## 2018-08-19 MED ORDER — HYDROCODONE-ACETAMINOPHEN 7.5-325 MG PO TABS: 1.0000 | ORAL_TABLET | ORAL | Status: DC | PRN

## 2018-08-19 MED ORDER — BUPIVACAINE-EPINEPHRINE (PF) 0.25% -1:200000 IJ SOLN
INTRAMUSCULAR | Status: AC
  Filled 2018-08-19: qty 30

## 2018-08-19 MED ORDER — METOCLOPRAMIDE HCL 5 MG/ML IJ SOLN: 5.0000 mg | Freq: Three times a day (TID) | INTRAMUSCULAR | Status: DC | PRN

## 2018-08-19 MED ORDER — FLUTICASONE PROPIONATE 50 MCG/ACT NA SUSP
2.0000 | Freq: Every day | NASAL | Status: DC | PRN
  Filled 2018-08-19: qty 16

## 2018-08-19 MED ORDER — TRANEXAMIC ACID-NACL 1000-0.7 MG/100ML-% IV SOLN
1000.0000 mg | INTRAVENOUS | Status: AC
  Administered 2018-08-19: 1000 mg via INTRAVENOUS

## 2018-08-19 MED ORDER — ATENOLOL 50 MG PO TABS
50.0000 mg | ORAL_TABLET | Freq: Every morning | ORAL | Status: DC
  Administered 2018-08-20 – 2018-08-21 (×2): 50 mg via ORAL
  Filled 2018-08-19 (×2): qty 1

## 2018-08-19 MED ORDER — ASPIRIN 81 MG PO CHEW
81.0000 mg | CHEWABLE_TABLET | Freq: Two times a day (BID) | ORAL | Status: DC
  Administered 2018-08-19 – 2018-08-21 (×4): 81 mg via ORAL
  Filled 2018-08-19 (×4): qty 1

## 2018-08-19 MED ORDER — DOCUSATE SODIUM 100 MG PO CAPS: 100.0000 mg | ORAL_CAPSULE | Freq: Two times a day (BID) | ORAL | 0 refills | Status: DC

## 2018-08-19 MED ORDER — SODIUM CHLORIDE 0.9 % IV SOLN
INTRAVENOUS | Status: DC | PRN
  Administered 2018-08-19: 20 ug/min via INTRAVENOUS

## 2018-08-19 SURGICAL SUPPLY — 54 items
ATTUNE MED ANAT PAT 32 KNEE (Knees) ×2 IMPLANT
ATTUNE MED ANAT PAT 32MM KNEE (Knees) ×1 IMPLANT
ATTUNE PSFEM LTSZ4 NARCEM KNEE (Femur) ×3 IMPLANT
ATTUNE PSRP INSR SZ4 7 KNEE (Insert) ×2 IMPLANT
ATTUNE PSRP INSR SZ4 7MM KNEE (Insert) ×1 IMPLANT
BAG ZIPLOCK 12X15 (MISCELLANEOUS) IMPLANT
BANDAGE ACE 6X5 VEL STRL LF (GAUZE/BANDAGES/DRESSINGS) ×3 IMPLANT
BASE TIBIAL ROT PLAT SZ 3 KNEE (Knees) ×1 IMPLANT
BLADE SAW SGTL 11.0X1.19X90.0M (BLADE) IMPLANT
BLADE SAW SGTL 13.0X1.19X90.0M (BLADE) ×3 IMPLANT
BOWL SMART MIX CTS (DISPOSABLE) ×3 IMPLANT
CEMENT HV SMART SET (Cement) ×6 IMPLANT
COVER SURGICAL LIGHT HANDLE (MISCELLANEOUS) ×3 IMPLANT
COVER WAND RF STERILE (DRAPES) IMPLANT
CUFF TOURN SGL QUICK 34 (TOURNIQUET CUFF) ×2
CUFF TRNQT CYL 34X4X40X1 (TOURNIQUET CUFF) ×1 IMPLANT
DECANTER SPIKE VIAL GLASS SM (MISCELLANEOUS) ×6 IMPLANT
DERMABOND ADVANCED (GAUZE/BANDAGES/DRESSINGS) ×2
DERMABOND ADVANCED .7 DNX12 (GAUZE/BANDAGES/DRESSINGS) ×1 IMPLANT
DRAPE U-SHAPE 47X51 STRL (DRAPES) ×3 IMPLANT
DRESSING AQUACEL AG SP 3.5X10 (GAUZE/BANDAGES/DRESSINGS) ×1 IMPLANT
DRSG AQUACEL AG SP 3.5X10 (GAUZE/BANDAGES/DRESSINGS) ×3
DURAPREP 26ML APPLICATOR (WOUND CARE) ×6 IMPLANT
ELECT REM PT RETURN 15FT ADLT (MISCELLANEOUS) ×3 IMPLANT
GLOVE BIOGEL M 7.0 STRL (GLOVE) IMPLANT
GLOVE BIOGEL PI IND STRL 7.5 (GLOVE) ×1 IMPLANT
GLOVE BIOGEL PI IND STRL 8.5 (GLOVE) ×1 IMPLANT
GLOVE BIOGEL PI INDICATOR 7.5 (GLOVE) ×2
GLOVE BIOGEL PI INDICATOR 8.5 (GLOVE) ×2
GLOVE ECLIPSE 8.0 STRL XLNG CF (GLOVE) ×3 IMPLANT
GLOVE ORTHO TXT STRL SZ7.5 (GLOVE) ×6 IMPLANT
GOWN STRL REUS W/TWL 2XL LVL3 (GOWN DISPOSABLE) ×3 IMPLANT
GOWN STRL REUS W/TWL LRG LVL3 (GOWN DISPOSABLE) ×3 IMPLANT
HANDPIECE INTERPULSE COAX TIP (DISPOSABLE) ×2
HOLDER FOLEY CATH W/STRAP (MISCELLANEOUS) IMPLANT
MANIFOLD NEPTUNE II (INSTRUMENTS) ×3 IMPLANT
NDL SAFETY ECLIPSE 18X1.5 (NEEDLE) IMPLANT
NEEDLE HYPO 18GX1.5 SHARP (NEEDLE)
PACK TOTAL KNEE CUSTOM (KITS) ×3 IMPLANT
PIN THREADED HEADED SIGMA (PIN) ×3 IMPLANT
PROTECTOR NERVE ULNAR (MISCELLANEOUS) ×3 IMPLANT
SET HNDPC FAN SPRY TIP SCT (DISPOSABLE) ×1 IMPLANT
SET PAD KNEE POSITIONER (MISCELLANEOUS) ×3 IMPLANT
SUT MNCRL AB 4-0 PS2 18 (SUTURE) ×3 IMPLANT
SUT STRATAFIX PDS+ 0 24IN (SUTURE) ×3 IMPLANT
SUT VIC AB 1 CT1 36 (SUTURE) ×3 IMPLANT
SUT VIC AB 2-0 CT1 27 (SUTURE) ×6
SUT VIC AB 2-0 CT1 TAPERPNT 27 (SUTURE) ×3 IMPLANT
SYRINGE 3CC LL L/F (MISCELLANEOUS) ×3 IMPLANT
TIBIAL BASE ROT PLAT SZ 3 KNEE (Knees) ×3 IMPLANT
TRAY FOLEY MTR SLVR 16FR STAT (SET/KITS/TRAYS/PACK) ×3 IMPLANT
WATER STERILE IRR 1000ML POUR (IV SOLUTION) ×3 IMPLANT
WRAP KNEE MAXI GEL POST OP (GAUZE/BANDAGES/DRESSINGS) ×3 IMPLANT
YANKAUER SUCT BULB TIP 10FT TU (MISCELLANEOUS) ×3 IMPLANT

## 2018-08-19 NOTE — Progress Notes (Signed)
Assisted Dr. Howze with left, ultrasound guided, adductor canal block. Side rails up, monitors on throughout procedure. See vital signs in flow sheet. Tolerated Procedure well.  

## 2018-08-19 NOTE — Care Plan (Signed)
Ortho Bundle Case Management Note  Patient Details  Name: KATORA FINI MRN: [id number] Date of Birth: 1941-03-28  L TKA scheduled on 08-19-18 DCP:  Home with dtr.  1 story home with 3 ste. DME:  No needs.  Has a RW and 3-in-1. PT:  Virtual PT                 DME Arranged:  N/A DME Agency:  NA  HH Arranged:  NA HH Agency:  NA  Additional Comments: Please contact me with any questions of if this plan should need to change.  Marianne Sofia, RN,CCM [facility]  [phone] 08/19/2018, 12:56 PM

## 2018-08-19 NOTE — Anesthesia Procedure Notes (Signed)
Spinal  Patient location during procedure: OR Start time: 08/19/2018 1:20 PM End time: 08/19/2018 1:25 PM Staffing Anesthesiologist: Lynda Rainwater, MD Performed: anesthesiologist  Preanesthetic Checklist Completed: patient identified, site marked, surgical consent, pre-op evaluation, timeout performed, IV checked, risks and benefits discussed and monitors and equipment checked Spinal Block Patient position: sitting Prep: ChloraPrep Patient monitoring: heart rate, continuous pulse ox and blood pressure Approach: midline Location: L3-4 Injection technique: single-shot Needle Needle type: Pencan  Needle gauge: 24 G

## 2018-08-19 NOTE — Discharge Instructions (Signed)

## 2018-08-19 NOTE — Anesthesia Procedure Notes (Signed)
Anesthesia Regional Block: Adductor canal block   Pre-Anesthetic Checklist: ,, timeout performed, Correct Patient, Correct Site, Correct Laterality, Correct Procedure, Correct Position, site marked, Risks and benefits discussed, pre-op evaluation,  At surgeon's request and post-op pain management  Laterality: Left  Prep: Maximum Sterile Barrier Precautions used, chloraprep       Needles:  Injection technique: Single-shot  Needle Type: Echogenic Stimulator Needle     Needle Length: 9cm  Needle Gauge: 22     Additional Needles:   Procedures:,,,, ultrasound used (permanent image in chart),,,,  Narrative:  Start time: 08/19/2018 12:02 PM End time: 08/19/2018 12:05 PM Injection made incrementally with aspirations every 5 mL.  Performed by: Personally  Anesthesiologist: Brennan Bailey, MD  Additional Notes: Risks, benefits, and alternative discussed. Patient gave consent for procedure. Patient prepped and draped in sterile fashion. Sedation administered, patient remains easily responsive to voice. Relevant anatomy identified with ultrasound guidance. Local anesthetic given in 5cc increments with no signs or symptoms of intravascular injection. No pain or paraesthesias with injection. Patient monitored throughout procedure with signs of LAST or immediate complications. Tolerated well. Ultrasound image placed in chart.  Tawny Asal, MD

## 2018-08-19 NOTE — Op Note (Signed)
NAME:  Desiree Rasmussen                      MEDICAL RECORD NO.:  [id number]                             FACILITY:  [facility]      PHYSICIAN:  Pietro Cassis. Alvan Dame, M.D.  DATE OF BIRTH:  May 04, 1941      DATE OF PROCEDURE:  08/19/2018                                     OPERATIVE REPORT         PREOPERATIVE DIAGNOSIS:  Left knee osteoarthritis.      POSTOPERATIVE DIAGNOSIS:  Left knee osteoarthritis.      FINDINGS:  The patient was noted to have complete loss of cartilage and   bone-on-bone arthritis with associated osteophytes in the medial and patellofemoral compartments of   the knee with a significant synovitis and associated effusion.  The patient had failed months of conservative treatment including medications, injection therapy, activity modification.     PROCEDURE:  Left total knee replacement.      COMPONENTS USED:  DePuy Attune rotating platform posterior stabilized knee   system, a size 4N femur, 3 tibia, size 7 mm PS AOX insert, and 32 anatomic patellar   button.      SURGEON:  Pietro Cassis. Alvan Dame, M.D.      ASSISTANT:  Danae Orleans, PA-C.      ANESTHESIA:  Regional and Spinal.      SPECIMENS:  None.      COMPLICATION:  None.      DRAINS:  None.  EBL: <100cc      TOURNIQUET TIME:  29 min at 250 mmHg     The patient was stable to the recovery room.      INDICATION FOR PROCEDURE:  Desiree Rasmussen is a 77 y.o. female patient of   mine.  The patient had been seen, evaluated, and treated for months conservatively in the   office with medication, activity modification, and injections.  The patient had   radiographic changes of bone-on-bone arthritis with endplate sclerosis and osteophytes noted.  Based on the radiographic changes and failed conservative measures, the patient   decided to proceed with definitive treatment, total knee replacement.  Risks of infection, DVT, component failure, need for revision surgery, neurovascular injury were reviewed in the office setting.   The postop course was reviewed stressing the efforts to maximize post-operative satisfaction and function.  Consent was obtained for benefit of pain   relief.      PROCEDURE IN DETAIL:  The patient was brought to the operative theater.   Once adequate anesthesia, preoperative antibiotics, 2 gm of Ancef,1 gm of Tranexamic Acid, and 10 mg of Decadron administered, the patient was positioned supine with a left thigh tourniquet placed.  The  left lower extremity was prepped and draped in sterile fashion.  A time-   out was performed identifying the patient, planned procedure, and the appropriate extremity.      The left lower extremity was placed in the [facility] leg holder.  The leg was   exsanguinated, tourniquet elevated to 250 mmHg.  A midline incision was   made followed by median parapatellar arthrotomy.  Following initial   exposure, attention was first  directed to the patella.  Precut   measurement was noted to be 20 mm.  I resected down to 13 mm and used a   32 anatomic patellar button to restore patellar height as well as cover the cut surface.      The lug holes were drilled and a metal shim was placed to protect the   patella from retractors and saw blade during the procedure.      At this point, attention was now directed to the femur.  The femoral   canal was opened with a drill, irrigated to try to prevent fat emboli.  An   intramedullary rod was passed at 3 degrees valgus, 9 mm of bone was   resected off the distal femur.  Following this resection, the tibia was   subluxated anteriorly.  Using the extramedullary guide, 2 mm of bone was resected off   the proximal medial tibia.  We confirmed the gap would be   stable medially and laterally with a size 5 spacer block as well as confirmed that the tibial cut was perpendicular in the coronal plane, checking with an alignment rod.      Once this was done, I sized the femur to be a size 4 in the anterior-   posterior dimension, chose a  narrow component based on medial and   lateral dimension.  The size 4 rotation block was then pinned in   position anterior referenced using the C-clamp to set rotation.  The   anterior, posterior, and  chamfer cuts were made without difficulty nor   notching making certain that I was along the anterior cortex to help   with flexion gap stability.      The final box cut was made off the lateral aspect of distal femur.      At this point, the tibia was sized to be a size 3.  The size 3 tray was   then pinned in position through the medial third of the tubercle,   drilled, and keel punched.  Trial reduction was now carried with a 4 femur,  3 tibia, a size 6 then 7 mm PS insert, and the 32 anatomic patella botton.  The knee was brought to full extension with good flexion stability with the patella   tracking through the trochlea without application of pressure.  Given   all these findings the trial components removed.  Final components were   opened and cement was mixed.  The knee was irrigated with normal saline solution and pulse lavage.  The synovial lining was   then injected with 30 cc of 0.25% Marcaine with epinephrine, 1 cc of Toradol and 30 cc of NS for a total of 61 cc.     Final implants were then cemented onto cleaned and dried cut surfaces of bone with the knee brought to extension with a size 7 mm PS trial insert.      Once the cement had fully cured, excess cement was removed   throughout the knee.  I confirmed that I was satisfied with the range of   motion and stability, and the final size 7 mm PS AOX insert was chosen.  It was   placed into the knee.      The tourniquet had been let down at 29 minutes.  No significant   hemostasis was required.  The extensor mechanism was then reapproximated using #1 Vicryl and #1 Stratafix sutures with the knee   in flexion.  The  remaining wound was closed with 2-0 Vicryl and running 4-0 Monocryl.   The knee was cleaned, dried, dressed  sterilely using Dermabond and   Aquacel dressing.  The patient was then   brought to recovery room in stable condition, tolerating the procedure   well.   Please note that Physician Assistant, Danae Orleans, PA-C was present for the entirety of the case, and was utilized for pre-operative positioning, peri-operative retractor management, general facilitation of the procedure and for primary wound closure at the end of the case.              Pietro Cassis Alvan Dame, M.D.    08/19/2018 1:29 PM

## 2018-08-19 NOTE — Interval H&P Note (Signed)
History and Physical Interval Note:  08/19/2018 11:48 AM  Desiree Rasmussen  has presented today for surgery, with the diagnosis of left knee osteoarthritis  The various methods of treatment have been discussed with the patient and family. After consideration of risks, benefits and other options for treatment, the patient has consented to  Procedure(s): TOTAL KNEE ARTHROPLASTY (Left) as a surgical intervention .  The patient's history has been reviewed, patient examined, no change in status, stable for surgery.  I have reviewed the patient's chart and labs.  Questions were answered to the patient's satisfaction.     Mauri Pole

## 2018-08-19 NOTE — Transfer of Care (Signed)
Immediate Anesthesia Transfer of Care Note  Patient: Desiree Rasmussen  Procedure(s) Performed: TOTAL KNEE ARTHROPLASTY (Left )  Patient Location: PACU  Anesthesia Type:Regional and Spinal  Level of Consciousness: awake, alert  and oriented  Airway & Oxygen Therapy: Patient Spontanous Breathing and Patient connected to face mask oxygen  Post-op Assessment: Report given to RN and Post -op Vital signs reviewed and stable  Post vital signs: Reviewed and stable  Last Vitals:  Vitals Value Taken Time  BP    Temp    Pulse    Resp    SpO2      Last Pain:  Vitals:   08/19/18 1205  TempSrc:   PainSc: 0-No pain      Patients Stated Pain Goal: 4 (09/73/53 2992)  Complications: No apparent anesthesia complications

## 2018-08-19 NOTE — Progress Notes (Signed)
Relieved Desiree Rasmussen  For transporting patient

## 2018-08-20 ENCOUNTER — Encounter (HOSPITAL_COMMUNITY): Payer: Self-pay | Admitting: Orthopedic Surgery

## 2018-08-20 DIAGNOSIS — E1122 Type 2 diabetes mellitus with diabetic chronic kidney disease: Secondary | ICD-10-CM | POA: Diagnosis present

## 2018-08-20 DIAGNOSIS — M25762 Osteophyte, left knee: Secondary | ICD-10-CM | POA: Diagnosis present

## 2018-08-20 DIAGNOSIS — Z973 Presence of spectacles and contact lenses: Secondary | ICD-10-CM | POA: Diagnosis not present

## 2018-08-20 DIAGNOSIS — E785 Hyperlipidemia, unspecified: Secondary | ICD-10-CM | POA: Diagnosis present

## 2018-08-20 DIAGNOSIS — Z8249 Family history of ischemic heart disease and other diseases of the circulatory system: Secondary | ICD-10-CM | POA: Diagnosis not present

## 2018-08-20 DIAGNOSIS — Z881 Allergy status to other antibiotic agents status: Secondary | ICD-10-CM | POA: Diagnosis not present

## 2018-08-20 DIAGNOSIS — Z96651 Presence of right artificial knee joint: Secondary | ICD-10-CM | POA: Diagnosis present

## 2018-08-20 DIAGNOSIS — Z6832 Body mass index (BMI) 32.0-32.9, adult: Secondary | ICD-10-CM | POA: Diagnosis not present

## 2018-08-20 DIAGNOSIS — Z886 Allergy status to analgesic agent status: Secondary | ICD-10-CM | POA: Diagnosis not present

## 2018-08-20 DIAGNOSIS — K219 Gastro-esophageal reflux disease without esophagitis: Secondary | ICD-10-CM | POA: Diagnosis present

## 2018-08-20 DIAGNOSIS — Z803 Family history of malignant neoplasm of breast: Secondary | ICD-10-CM | POA: Diagnosis not present

## 2018-08-20 DIAGNOSIS — N183 Chronic kidney disease, stage 3 (moderate): Secondary | ICD-10-CM | POA: Diagnosis present

## 2018-08-20 DIAGNOSIS — I129 Hypertensive chronic kidney disease with stage 1 through stage 4 chronic kidney disease, or unspecified chronic kidney disease: Secondary | ICD-10-CM | POA: Diagnosis present

## 2018-08-20 DIAGNOSIS — Z888 Allergy status to other drugs, medicaments and biological substances status: Secondary | ICD-10-CM | POA: Diagnosis not present

## 2018-08-20 DIAGNOSIS — Z85828 Personal history of other malignant neoplasm of skin: Secondary | ICD-10-CM | POA: Diagnosis not present

## 2018-08-20 DIAGNOSIS — Z7982 Long term (current) use of aspirin: Secondary | ICD-10-CM | POA: Diagnosis not present

## 2018-08-20 DIAGNOSIS — E039 Hypothyroidism, unspecified: Secondary | ICD-10-CM | POA: Diagnosis present

## 2018-08-20 DIAGNOSIS — Z972 Presence of dental prosthetic device (complete) (partial): Secondary | ICD-10-CM | POA: Diagnosis not present

## 2018-08-20 DIAGNOSIS — Z79899 Other long term (current) drug therapy: Secondary | ICD-10-CM | POA: Diagnosis not present

## 2018-08-20 DIAGNOSIS — Z882 Allergy status to sulfonamides status: Secondary | ICD-10-CM | POA: Diagnosis not present

## 2018-08-20 DIAGNOSIS — Z96659 Presence of unspecified artificial knee joint: Secondary | ICD-10-CM

## 2018-08-20 DIAGNOSIS — F329 Major depressive disorder, single episode, unspecified: Secondary | ICD-10-CM | POA: Diagnosis present

## 2018-08-20 DIAGNOSIS — E669 Obesity, unspecified: Secondary | ICD-10-CM | POA: Diagnosis present

## 2018-08-20 DIAGNOSIS — Z79890 Hormone replacement therapy (postmenopausal): Secondary | ICD-10-CM | POA: Diagnosis not present

## 2018-08-20 DIAGNOSIS — M1712 Unilateral primary osteoarthritis, left knee: Secondary | ICD-10-CM | POA: Diagnosis present

## 2018-08-20 LAB — CBC
HEMATOCRIT: 38.2 % (ref 36.0–46.0)
HEMOGLOBIN: 12.4 g/dL (ref 12.0–15.0)
MCH: 30.3 pg (ref 26.0–34.0)
MCHC: 32.5 g/dL (ref 30.0–36.0)
MCV: 93.4 fL (ref 80.0–100.0)
Platelets: 235 10*3/uL (ref 150–400)
RBC: 4.09 MIL/uL (ref 3.87–5.11)
RDW: 12.9 % (ref 11.5–15.5)
WBC: 10.3 10*3/uL (ref 4.0–10.5)
nRBC: 0 % (ref 0.0–0.2)

## 2018-08-20 LAB — BASIC METABOLIC PANEL
Anion gap: 10 (ref 5–15)
BUN: 26 mg/dL — AB (ref 8–23)
CO2: 23 mmol/L (ref 22–32)
CREATININE: 1.21 mg/dL — AB (ref 0.44–1.00)
Calcium: 8.8 mg/dL — ABNORMAL LOW (ref 8.9–10.3)
Chloride: 102 mmol/L (ref 98–111)
GFR calc non Af Amer: 42 mL/min — ABNORMAL LOW (ref >60)
GFR, EST AFRICAN AMERICAN: 49 mL/min — AB (ref >60)
Glucose, Bld: 190 mg/dL — ABNORMAL HIGH (ref 70–99)
POTASSIUM: 3.7 mmol/L (ref 3.5–5.1)
SODIUM: 135 mmol/L (ref 135–145)

## 2018-08-20 LAB — GLUCOSE, CAPILLARY
GLUCOSE-CAPILLARY: 187 mg/dL — AB (ref 70–99)
GLUCOSE-CAPILLARY: 206 mg/dL — AB (ref 70–99)
Glucose-Capillary: 250 mg/dL — ABNORMAL HIGH (ref 70–99)
Glucose-Capillary: 161 mg/dL — ABNORMAL HIGH (ref 70–99)

## 2018-08-20 NOTE — Evaluation (Signed)
Physical Therapy Evaluation Patient Details Name: Desiree Rasmussen MRN: [id number] DOB: [birth year] Today's Date: 08/20/2018   History of Present Illness  Pt s/p L TKR and with hx of R TKR last year.  Pt also with recent dx of DM  Clinical Impression  Pt s/p L TKR and presents with decreased L LE strength/ROM and post op pain limiting functional mobility.  Pt should progress to dc home with family assist.    Follow Up Recommendations Other (comment)([facility])    Equipment Recommendations  None recommended by PT    Recommendations for Other Services       Precautions / Restrictions Precautions Precautions: Knee Restrictions Weight Bearing Restrictions: No Other Position/Activity Restrictions: WBAT      Mobility  Bed Mobility Overal bed mobility: Needs Assistance Bed Mobility: Sit to Supine       Sit to supine: Min assist   General bed mobility comments: cues for sequence and assist with L LE   Transfers Overall transfer level: Needs assistance Equipment used: Rolling walker (2 wheeled) Transfers: Sit to/from Stand Sit to Stand: Min assist         General transfer comment: cues for LE management and use of UEs to self assist  Ambulation/Gait Ambulation/Gait assistance: Min assist;Min guard Gait Distance (Feet): 90 Feet Assistive device: Rolling walker (2 wheeled) Gait Pattern/deviations: Step-to pattern;Decreased step length - right;Decreased step length - left;Shuffle;Trunk flexed Gait velocity: decreased   General Gait Details: cues for sequence, posture and position from [employer]            Wheelchair Mobility    Modified Rankin (Stroke Patients Only)       Balance Overall balance assessment: Mild deficits observed, not formally tested                                           Pertinent Vitals/Pain Pain Assessment: 0-10 Pain Score: 4  Pain Location: L knee Pain Descriptors / Indicators: Aching;Sore Pain Intervention(s):  Limited activity within patient's tolerance;Monitored during session;Premedicated before session;Ice applied    Home Living Family/patient expects to be discharged to:: Private residence Living Arrangements: Alone Available Help at Discharge: Family Type of Home: House Home Access: Stairs to enter Entrance Stairs-Rails: Right Entrance Stairs-Number of Steps: 3 Home Layout: One level Home Equipment: Environmental consultant - 2 wheels      Prior Function Level of Independence: Independent               Hand Dominance        Extremity/Trunk Assessment   Upper Extremity Assessment Upper Extremity Assessment: Overall WFL for tasks assessed    Lower Extremity Assessment Lower Extremity Assessment: LLE deficits/detail LLE Deficits / Details: 3/5 quads with IND SLR;  AAROm at knee -10 - 75       Communication   Communication: No difficulties  Cognition Arousal/Alertness: Awake/alert Behavior During Therapy: WFL for tasks assessed/performed Overall Cognitive Status: Within Functional Limits for tasks assessed                                        General Comments      Exercises Total Joint Exercises Ankle Circles/Pumps: AROM;15 reps;Supine;Both Quad Sets: AROM;Both;10 reps;Supine Heel Slides: AAROM;Left;15 reps;Supine Straight Leg Raises: AAROM;AROM;Left;10 reps;Supine   Assessment/Plan    PT Assessment  Patient needs continued PT services  PT Problem List Decreased strength;Decreased range of motion;Decreased activity tolerance;Decreased mobility;Decreased knowledge of use of DME;Pain       PT Treatment Interventions DME instruction;Gait training;Stair training;Functional mobility training;Therapeutic activities;Therapeutic exercise;Patient/family education    PT Goals (Current goals can be found in the Care Plan section)  Acute Rehab PT Goals Patient Stated Goal: Regain IND PT Goal Formulation: With patient Time For Goal Achievement: 08/27/18 Potential  to Achieve Goals: Good    Frequency 7X/week   Barriers to discharge        Co-evaluation               AM-PAC PT "6 Clicks" Daily Activity  Outcome Measure Difficulty turning over in bed (including adjusting bedclothes, sheets and blankets)?: Unable Difficulty moving from lying on back to sitting on the side of the bed? : Unable Difficulty sitting down on and standing up from a chair with arms (e.g., wheelchair, bedside commode, etc,.)?: Unable Help needed moving to and from a bed to chair (including a wheelchair)?: A Little Help needed walking in hospital room?: A Little Help needed climbing 3-5 steps with a railing? : A Little 6 Click Score: 12    End of Session Equipment Utilized During Treatment: Gait belt Activity Tolerance: Patient tolerated treatment well Patient left: in bed;with call bell/phone within reach;with family/visitor present Nurse Communication: Mobility status PT Visit Diagnosis: Difficulty in walking, not elsewhere classified (R26.2)    Time: 3810-1751 PT Time Calculation (min) (ACUTE ONLY): 31 min   Charges:   PT Evaluation $PT Eval Low Complexity: 1 Low PT Treatments $Therapeutic Exercise: 8-22 mins        Debe Coder PT Acute Rehabilitation Services Pager [phone] Office [phone]   Kristiana Jacko 08/20/2018, 12:33 PM

## 2018-08-20 NOTE — Plan of Care (Signed)
Pt alert and oriented, resting with pain well controlled with PO pain meds.  Doing well. Plan of care discussed with pt. RN will monitor.

## 2018-08-20 NOTE — Anesthesia Postprocedure Evaluation (Signed)
Anesthesia Post Note  Patient: BEE MARCHIANO  Procedure(s) Performed: TOTAL KNEE ARTHROPLASTY (Left )     Patient location during evaluation: PACU Anesthesia Type: Spinal Level of consciousness: oriented and awake and alert Pain management: pain level controlled Vital Signs Assessment: post-procedure vital signs reviewed and stable Respiratory status: spontaneous breathing, respiratory function stable and patient connected to nasal cannula oxygen Cardiovascular status: blood pressure returned to baseline and stable Postop Assessment: no headache, no backache and no apparent nausea or vomiting Anesthetic complications: no    Last Vitals:  Vitals:   08/20/18 0517 08/20/18 0820  BP: 133/75 128/64  Pulse: 78 76  Resp: 16 16  Temp: 36.7 C 36.7 C  SpO2: 97% 94%    Last Pain:  Vitals:   08/20/18 0820  TempSrc: Oral  PainSc:                  Effie Berkshire

## 2018-08-20 NOTE — Progress Notes (Signed)
     Subjective: 1 Day Post-Op Procedure(s) (LRB): TOTAL KNEE ARTHROPLASTY (Left)   Patient reports pain as mild, pain controlled. No events throughout the night. Feels that she is doing wll, but weary about working with PT.    Anticipated LOS equal to or greater than 2 midnights due to - Age 76 and older with one or more of the following:  - Obesity  - Expected need for hospital services (PT, OT, Nursing) required for safe  discharge  - Active co-morbidities: Diabetes and CKD   Objective:   VITALS:   Vitals:   08/20/18 0517 08/20/18 0820  BP: 133/75 128/64  Pulse: 78 76  Resp: 16 16  Temp: 98 F (36.7 C) 98.1 F (36.7 C)  SpO2: 97% 94%    Dorsiflexion/Plantar flexion intact Incision: dressing C/D/I No cellulitis present Compartment soft  LABS Recent Labs    08/20/18 0547  HGB 12.4  HCT 38.2  WBC 10.3  PLT 235    Recent Labs    08/20/18 0547  NA 135  K 3.7  BUN 26*  CREATININE 1.21*  GLUCOSE 190*     Assessment/Plan: 1 Day Post-Op Procedure(s) (LRB): TOTAL KNEE ARTHROPLASTY (Left) Foley cath d/c'ed Advance diet Up with therapy D/C IV fluids Discharge home eventually when ready, probably Thursday  Obese (BMI 30-39.9) Estimated body mass index is 32.27 kg/m as calculated from the following:   Height as of this encounter: 5\' 4"  (1.626 m).   Weight as of this encounter: 85.3 kg. Patient also counseled that weight may inhibit the healing process Patient counseled that losing weight will help with future health issues       [place] P[place]. Tessica Cupo   PAC  08/20/2018, 8:56 AM

## 2018-08-20 NOTE — Progress Notes (Signed)
Physical Therapy Treatment Patient Details Name: Desiree Rasmussen MRN: [id number] DOB: 1941-08-23 Today's Date: 08/20/2018    History of Present Illness Pt s/p L TKR and with hx of R TKR last year.  Pt also with recent dx of DM    PT Comments    Progressing well with mobility and eager for dc home tomorrow.   Follow Up Recommendations  Other (comment)     Equipment Recommendations  None recommended by PT    Recommendations for Other Services       Precautions / Restrictions Precautions Precautions: Knee Restrictions Weight Bearing Restrictions: No Other Position/Activity Restrictions: WBAT    Mobility  Bed Mobility Overal bed mobility: Needs Assistance Bed Mobility: Supine to Sit;Sit to Supine     Supine to sit: Min guard Sit to supine: Min guard   General bed mobility comments: cues for sequence   Transfers Overall transfer level: Needs assistance Equipment used: Rolling walker (2 wheeled) Transfers: Sit to/from Stand Sit to Stand: Min guard         General transfer comment: cues for LE management and use of UEs to self assist  Ambulation/Gait Ambulation/Gait assistance: Min guard Gait Distance (Feet): 140 Feet Assistive device: Rolling walker (2 wheeled) Gait Pattern/deviations: Step-to pattern;Decreased step length - right;Decreased step length - left;Shuffle;Trunk flexed Gait velocity: decreased   General Gait Details: cues for sequence, posture and position from [employer]             Wheelchair Mobility    Modified Rankin (Stroke Patients Only)       Balance Overall balance assessment: Mild deficits observed, not formally tested                                          Cognition Arousal/Alertness: Awake/alert Behavior During Therapy: WFL for tasks assessed/performed Overall Cognitive Status: Within Functional Limits for tasks assessed                                        Exercises Total  Joint Exercises Ankle Circles/Pumps: AROM;15 reps;Supine;Both Quad Sets: AROM;Both;10 reps;Supine Heel Slides: AAROM;Left;15 reps;Supine Straight Leg Raises: AAROM;AROM;Left;10 reps;Supine    General Comments        Pertinent Vitals/Pain Pain Assessment: 0-10 Pain Score: 4  Pain Location: L knee Pain Descriptors / Indicators: Aching;Sore Pain Intervention(s): Limited activity within patient's tolerance;Monitored during session;Premedicated before session;Ice applied    Home Living                      Prior Function            PT Goals (current goals can now be found in the care plan section) Acute Rehab PT Goals Patient Stated Goal: Regain IND PT Goal Formulation: With patient Time For Goal Achievement: 08/27/18 Potential to Achieve Goals: Good Progress towards PT goals: Progressing toward goals    Frequency    7X/week      PT Plan Current plan remains appropriate    Co-evaluation              AM-PAC PT "6 Clicks" Daily Activity  Outcome Measure  Difficulty turning over in bed (including adjusting bedclothes, sheets and blankets)?: A Lot Difficulty moving from lying on back to sitting on the side of the  bed? : A Lot Difficulty sitting down on and standing up from a chair with arms (e.g., wheelchair, bedside commode, etc,.)?: A Lot Help needed moving to and from a bed to chair (including a wheelchair)?: A Little Help needed walking in hospital room?: A Little Help needed climbing 3-5 steps with a railing? : A Little 6 Click Score: 15    End of Session Equipment Utilized During Treatment: Gait belt Activity Tolerance: Patient tolerated treatment well Patient left: with call bell/phone within reach;in bed;with family/visitor present Nurse Communication: Mobility status PT Visit Diagnosis: Difficulty in walking, not elsewhere classified (R26.2)     Time: 3299-2426 PT Time Calculation (min) (ACUTE ONLY): 33 min  Charges:  $Gait Training:  8-22 mins $Therapeutic Exercise: 8-22 mins                     [place] Pager [phone] Office [phone]    Desiree Rasmussen 08/20/2018, 1:56 PM

## 2018-08-21 LAB — CBC
HEMATOCRIT: 37.8 % (ref 36.0–46.0)
HEMOGLOBIN: 11.8 g/dL — AB (ref 12.0–15.0)
MCH: 30.2 pg (ref 26.0–34.0)
MCHC: 31.2 g/dL (ref 30.0–36.0)
MCV: 96.7 fL (ref 80.0–100.0)
NRBC: 0 % (ref 0.0–0.2)
Platelets: 214 10*3/uL (ref 150–400)
RBC: 3.91 MIL/uL (ref 3.87–5.11)
RDW: 13.3 % (ref 11.5–15.5)
WBC: 14.2 10*3/uL — AB (ref 4.0–10.5)

## 2018-08-21 LAB — BASIC METABOLIC PANEL
ANION GAP: 11 (ref 5–15)
BUN: 21 mg/dL (ref 8–23)
CHLORIDE: 102 mmol/L (ref 98–111)
CO2: 27 mmol/L (ref 22–32)
Calcium: 9.1 mg/dL (ref 8.9–10.3)
Creatinine, Ser: 0.86 mg/dL (ref 0.44–1.00)
GFR calc non Af Amer: >60 mL/min (ref >60)
GLUCOSE: 148 mg/dL — AB (ref 70–99)
Potassium: 3.9 mmol/L (ref 3.5–5.1)
Sodium: 140 mmol/L (ref 135–145)

## 2018-08-21 LAB — GLUCOSE, CAPILLARY: Glucose-Capillary: 129 mg/dL — ABNORMAL HIGH (ref 70–99)

## 2018-08-21 NOTE — Progress Notes (Signed)
RN reviewed discharge instructions with patient and family. All questions answered.   Paperwork and prescriptions given.   NT rolled patient down with all belongings to family car. 

## 2018-08-21 NOTE — Progress Notes (Signed)
Patient ID: Desiree Rasmussen, female   DOB: 1941-08-04, 77 y.o.   MRN: [id number] Subjective: 2 Days Post-Op Procedure(s) (LRB): TOTAL KNEE ARTHROPLASTY (Left)    Patient reports pain as mild to moderate.  She is doing much better today than yesterday.  Feels stronger, more secure  Objective:   VITALS:   Vitals:   08/20/18 2144 08/21/18 0601  BP: 136/79 138/74  Pulse: 70 67  Resp: 16 16  Temp: 98.7 F (37.1 C) 98 F (36.7 C)  SpO2: 95% 95%    Neurovascular intact Incision: dressing C/D/I  LABS Recent Labs    08/20/18 0547 08/21/18 0529  HGB 12.4 11.8*  HCT 38.2 37.8  WBC 10.3 14.2*  PLT 235 214    Recent Labs    08/20/18 0547 08/21/18 0529  NA 135 140  K 3.7 3.9  BUN 26* 21  CREATININE 1.21* 0.86  GLUCOSE 190* 148*    No results for input(s): LABPT, INR in the last 72 hours.   Assessment/Plan: 2 Days Post-Op Procedure(s) (LRB): TOTAL KNEE ARTHROPLASTY (Left)   Up with therapy  Home today after PT session Outpt PT already set up RTC in 2 weeks for wound check Reviewed goals as she has had her right knee replaced

## 2018-08-21 NOTE — Progress Notes (Signed)
Physical Therapy Treatment Patient Details Name: Desiree Rasmussen MRN: [id number] DOB: 1940/11/27 Today's Date: 08/21/2018    History of Present Illness Pt s/p L TKR and with hx of R TKR last year.  Pt also with recent dx of DM    PT Comments    Pt progressing well with mobility and eager for dc home.  This am, pt reviewed therex program and negotiated stairs with rail and QC   Follow Up Recommendations  Other (comment)     Equipment Recommendations  None recommended by PT    Recommendations for Other Services       Precautions / Restrictions Precautions Precautions: Knee Restrictions Weight Bearing Restrictions: No Other Position/Activity Restrictions: WBAT    Mobility  Bed Mobility               General bed mobility comments: Pt up in chair and requests back to same  Transfers Overall transfer level: Needs assistance Equipment used: Rolling walker (2 wheeled) Transfers: Sit to/from Stand Sit to Stand: Supervision         General transfer comment: cues for LE management and use of UEs to self assist  Ambulation/Gait Ambulation/Gait assistance: Min guard;Supervision Gait Distance (Feet): 100 Feet Assistive device: Rolling walker (2 wheeled) Gait Pattern/deviations: Step-to pattern;Decreased step length - right;Decreased step length - left;Shuffle;Trunk flexed Gait velocity: decreased   General Gait Details: min cues for sequence, posture and position from [employer]             Wheelchair Mobility    Modified Rankin (Stroke Patients Only)       Balance Overall balance assessment: Mild deficits observed, not formally tested                                          Cognition Arousal/Alertness: Awake/alert Behavior During Therapy: WFL for tasks assessed/performed Overall Cognitive Status: Within Functional Limits for tasks assessed                                        Exercises Total Joint  Exercises Ankle Circles/Pumps: AROM;15 reps;Supine;Both Quad Sets: AROM;Both;Supine;15 reps Heel Slides: AAROM;Left;Supine;20 reps Straight Leg Raises: AAROM;AROM;Left;Supine;20 reps Goniometric ROM: AAROM R knee -10 - 80    General Comments        Pertinent Vitals/Pain Pain Assessment: 0-10 Pain Score: 5  Pain Location: L knee Pain Descriptors / Indicators: Aching;Sore Pain Intervention(s): Limited activity within patient's tolerance;Monitored during session;Premedicated before session;Ice applied    Home Living                      Prior Function            PT Goals (current goals can now be found in the care plan section) Acute Rehab PT Goals Patient Stated Goal: Regain IND PT Goal Formulation: With patient Time For Goal Achievement: 08/27/18 Potential to Achieve Goals: Good Progress towards PT goals: Progressing toward goals    Frequency    7X/week      PT Plan Current plan remains appropriate    Co-evaluation              AM-PAC PT "6 Clicks" Daily Activity  Outcome Measure  Difficulty turning over in bed (including adjusting bedclothes, sheets and blankets)?: A Lot  Difficulty moving from lying on back to sitting on the side of the bed? : A Lot Difficulty sitting down on and standing up from a chair with arms (e.g., wheelchair, bedside commode, etc,.)?: A Lot Help needed moving to and from a bed to chair (including a wheelchair)?: A Little Help needed walking in hospital room?: A Little Help needed climbing 3-5 steps with a railing? : A Little 6 Click Score: 15    End of Session Equipment Utilized During Treatment: Gait belt Activity Tolerance: Patient tolerated treatment well Patient left: with call bell/phone within reach;in bed;with family/visitor present Nurse Communication: Mobility status PT Visit Diagnosis: Difficulty in walking, not elsewhere classified (R26.2)     Time: 3825-0539 PT Time Calculation (min) (ACUTE ONLY): 30  min  Charges:  $Gait Training: 8-22 mins $Therapeutic Exercise: 8-22 mins                     [place] Pager [phone] Office [phone]    Morayma Godown 08/21/2018, 10:52 AM

## 2018-08-24 NOTE — Progress Notes (Signed)
It is documented that Ms Lemberger is taking Synthroid at home.  The diagnosis of hypothyroidism was not listed in her medical history but should be included for documentation standpoint

## 2018-08-25 NOTE — Discharge Summary (Signed)
Physician Discharge Summary  Patient ID: Desiree Rasmussen MRN: [id number] DOB/AGE: May 14, 1941 77 y.o.  Admit date: 08/19/2018 Discharge date: 08/21/2018   Procedures:  Procedure(s) (LRB): TOTAL KNEE ARTHROPLASTY (Left)  Attending Physician:  Dr. Paralee Rasmussen   Admission Diagnoses:   Left knee primary OA / pain  Discharge Diagnoses:  Principal Problem:   S/P left TKA Active Problems:   Obese   S/P knee replacement  Past Medical History:  Diagnosis Date  . BPPV (benign paroxysmal positional vertigo), left   . Depression   . Family history of heart disease   . GERD (gastroesophageal reflux disease)   . History of nuclear stress test 05/01/2012   dr berry   normal lexiscan nuclear study w/ no ischemia/  normal LV function and wall motion , ef 73%  . Hyperlipidemia   . Hypertension   . Hypothyroidism   . OA (osteoarthritis)   . Type 2 diabetes mellitus ([place])   . Wears contact lenses    left eye only  . Wears partial dentures    upper    HPI:    Desiree Rasmussen, 77 y.o. female, has a history of pain and functional disability in the left knee due to arthritis and has failed non-surgical conservative treatments for greater than 12 weeks to include NSAID's and/or analgesics, corticosteriod injections, use of assistive devices and activity modification.  Onset of symptoms was gradual, starting  years ago with gradually worsening course since that time. The patient noted prior procedures on the knee to include  arthroplasty on the right knee(s).  Patient currently rates pain in the left knee(s) at 9 out of 10 with activity. Patient has night pain, worsening of pain with activity and weight bearing, pain that interferes with activities of daily living, pain with passive range of motion, crepitus and joint swelling.  Patient has evidence of periarticular osteophytes and joint space narrowing by imaging studies.  There is no active infection.  Risks, benefits and expectations were  discussed with the patient.  Risks including but not limited to the risk of anesthesia, blood clots, nerve damage, blood vessel damage, failure of the prosthesis, infection and up to and including death.  Patient understand the risks, benefits and expectations and wishes to proceed with surgery.   PCP: Desiree Rasmussen, Desiree Rasmussen   Discharged Condition: good  Hospital Course:  Patient underwent the above stated procedure on 08/19/2018. Patient tolerated the procedure well and brought to the recovery room in good condition and subsequently to the floor.  POD #1 BP: 128/64 ; Pulse: 76 ; Temp: 98.1 F (36.7 C) ; Resp: 16 Patient reports pain as mild, pain controlled. No events throughout the night. Feels that she is doing wll, but weary about working with PT. Dorsiflexion/plantar flexion intact, incision: dressing C/D/I, no cellulitis present and compartment soft.   LABS  Basename    HGB     12.4  HCT     38.2   POD #2  BP: 138/74 ; Pulse: 67 ; Temp: 98 F (36.7 C) ; Resp: 16 Patient reports pain as mild to moderate.  She is doing much better today than yesterday.  Feels stronger, more secure. Neurovascular intact and incision: dressing C/D/I.   LABS  Basename    HGB     11.8  HCT     37.8    Discharge Exam: General appearance: alert, cooperative and no distress Extremities: Homans sign is negative, no sign of DVT, no edema, redness or tenderness in  the calves or thighs and no ulcers, gangrene or trophic changes  Disposition:  Home with follow up in 2 weeks   Follow-up Information    Desiree Rasmussen, Desiree Rasmussen. Go on 09/03/2018.   Specialty:  Orthopedic Surgery Why:  You are scheduled for a post-op appointment with Desiree Rasmussen on 09-03-18 at 3:15 pm Contact information: [address] [place] [address] [place] [address] [phone]           Discharge Instructions    Call Desiree Rasmussen / Call 911   Complete by:  As directed    If you experience chest pain or shortness of breath, CALL 911 and be  transported to the hospital emergency room.  If you develope a fever above 101 F, pus (white drainage) or increased drainage or redness at the wound, or calf pain, call your surgeon's office.   Change dressing   Complete by:  As directed    Maintain surgical dressing until follow up in the clinic. If the edges start to pull up, may reinforce with tape. If the dressing is no longer working, may remove and cover with gauze and tape, but must keep the area dry and clean.  Call with any questions or concerns.   Constipation Prevention   Complete by:  As directed    Drink plenty of fluids.  Prune juice may be helpful.  You may use a stool softener, such as Colace (over the counter) 100 mg twice a day.  Use MiraLax (over the counter) for constipation as needed.   Diet - low sodium heart healthy   Complete by:  As directed    Discharge instructions   Complete by:  As directed    Maintain surgical dressing until follow up in the clinic. If the edges start to pull up, may reinforce with tape. If the dressing is no longer working, may remove and cover with gauze and tape, but must keep the area dry and clean.  Follow up in 2 weeks at [facility]. Call with any questions or concerns.   Increase activity slowly as tolerated   Complete by:  As directed    Weight bearing as tolerated with assist device (walker, cane, etc) as directed, use it as long as suggested by your surgeon or therapist, typically at least 4-6 weeks.   TED hose   Complete by:  As directed    Use stockings (TED hose) for 2 weeks on both leg(s).  You may remove them at night for sleeping.      Allergies as of 08/21/2018      Reactions   Lexapro [escitalopram Oxalate] Diarrhea   Doxycycline Nausea And Vomiting   Upset stomach   Erythromycin Other (See Comments)   "upset stomach"   Macrobid [nitrofurantoin] Other (See Comments)   Didn't work   Sulfa Antibiotics Rash      Medication List    STOP taking these  medications   acetaminophen 500 MG tablet Commonly known as:  TYLENOL   aspirin EC 81 MG tablet Replaced by:  aspirin 81 MG chewable tablet   HYDROcodone-Chlorpheniramine 5-4 MG/5ML Soln   meloxicam 15 MG tablet Commonly known as:  MOBIC     TAKE these medications   aspirin 81 MG chewable tablet Chew 1 tablet (81 mg total) by mouth 2 (two) times daily. Take for 4 weeks, then resume regular dose. Replaces:  aspirin EC 81 MG tablet   atenolol 50 MG tablet Commonly known as:  TENORMIN Take 50 mg by mouth every  morning.   atorvastatin 10 MG tablet Commonly known as:  LIPITOR Take 10 mg by mouth every morning.   clorazepate 7.5 MG tablet Commonly known as:  TRANXENE Take 7.5 mg by mouth daily as needed for anxiety (vertigo).   docusate sodium 100 MG capsule Commonly known as:  COLACE Take 1 capsule (100 mg total) by mouth 2 (two) times daily.   ferrous sulfate 325 (65 FE) MG tablet Take 1 tablet (325 mg total) by mouth 3 (three) times daily with meals. Notes to patient:  iron   fluticasone 50 MCG/ACT nasal spray Commonly known as:  FLONASE Place 2 sprays into both nostrils daily as needed for allergies or rhinitis.   HYDROcodone-acetaminophen 7.5-325 MG tablet Commonly known as:  NORCO Take 1-2 tablets by mouth every 4 (four) hours as needed for moderate pain.   HYDROcodone-acetaminophen 7.5-325 MG tablet Commonly known as:  NORCO Take 1-2 tablets by mouth every 4 (four) hours as needed for moderate pain.   liver oil-zinc oxide 40 % ointment Commonly known as:  DESITIN Apply 1 application topically as needed for irritation.   metFORMIN 1000 MG tablet Commonly known as:  GLUCOPHAGE Take 500-1,000 mg by mouth daily. Take 500 mg by mouth once daily for 7 days then take 1000 mg once daily   methocarbamol 500 MG tablet Commonly known as:  ROBAXIN Take 1 tablet (500 mg total) by mouth every 6 (six) hours as needed for muscle spasms.   multivitamin with minerals  tablet Take 1 tablet by mouth daily.   polyethylene glycol packet Commonly known as:  MIRALAX / GLYCOLAX Take 17 g by mouth 2 (two) times daily. Notes to patient:  Laxative powder   SYNTHROID 100 MCG tablet Generic drug:  levothyroxine Take 100 mcg by mouth daily before breakfast.   triamterene-hydrochlorothiazide 37.5-25 MG tablet Commonly known as:  MAXZIDE-25 Take 0.5 tablets by mouth daily.            Discharge Care Instructions  (From admission, onward)         Start     Ordered   08/21/18 0000  Change dressing    Comments:  Maintain surgical dressing until follow up in the clinic. If the edges start to pull up, may reinforce with tape. If the dressing is no longer working, may remove and cover with gauze and tape, but must keep the area dry and clean.  Call with any questions or concerns.   08/21/18 1008           Signed: [place] Pugh. Nichlas Pitera   PA-C  08/25/2018, 6:41 PM

## 2018-09-26 DIAGNOSIS — Z96652 Presence of left artificial knee joint: Secondary | ICD-10-CM | POA: Diagnosis not present

## 2018-09-26 DIAGNOSIS — Z471 Aftercare following joint replacement surgery: Secondary | ICD-10-CM | POA: Diagnosis not present

## 2018-10-01 DIAGNOSIS — I219 Acute myocardial infarction, unspecified: Secondary | ICD-10-CM

## 2018-10-01 HISTORY — DX: Acute myocardial infarction, unspecified: I21.9

## 2018-11-24 DIAGNOSIS — R5383 Other fatigue: Secondary | ICD-10-CM | POA: Diagnosis not present

## 2018-11-24 DIAGNOSIS — F4321 Adjustment disorder with depressed mood: Secondary | ICD-10-CM | POA: Diagnosis not present

## 2018-11-24 DIAGNOSIS — R197 Diarrhea, unspecified: Secondary | ICD-10-CM | POA: Diagnosis not present

## 2018-11-24 DIAGNOSIS — R079 Chest pain, unspecified: Secondary | ICD-10-CM | POA: Diagnosis not present

## 2018-12-10 ENCOUNTER — Encounter (HOSPITAL_COMMUNITY)
Admission: EM | Disposition: A | Discharge status: Home / Self Care | Payer: Self-pay | Source: Ambulatory Visit | Attending: Cardiovascular Disease

## 2018-12-10 ENCOUNTER — Inpatient Hospital Stay (HOSPITAL_COMMUNITY)
Admission: EM | Admit: 2018-12-10 | Discharge: 2018-12-12 | DRG: 247 | Disposition: A | Discharge status: Home / Self Care | Payer: Medicare Other | Source: Ambulatory Visit | Attending: Cardiovascular Disease | Admitting: Cardiovascular Disease

## 2018-12-10 ENCOUNTER — Other Ambulatory Visit: Payer: Self-pay

## 2018-12-10 ENCOUNTER — Encounter (HOSPITAL_COMMUNITY): Payer: Self-pay

## 2018-12-10 DIAGNOSIS — I2102 ST elevation (STEMI) myocardial infarction involving left anterior descending coronary artery: Secondary | ICD-10-CM

## 2018-12-10 DIAGNOSIS — Z882 Allergy status to sulfonamides status: Secondary | ICD-10-CM

## 2018-12-10 DIAGNOSIS — E039 Hypothyroidism, unspecified: Secondary | ICD-10-CM | POA: Diagnosis present

## 2018-12-10 DIAGNOSIS — K219 Gastro-esophageal reflux disease without esophagitis: Secondary | ICD-10-CM | POA: Diagnosis present

## 2018-12-10 DIAGNOSIS — E119 Type 2 diabetes mellitus without complications: Secondary | ICD-10-CM | POA: Diagnosis not present

## 2018-12-10 DIAGNOSIS — Z888 Allergy status to other drugs, medicaments and biological substances status: Secondary | ICD-10-CM

## 2018-12-10 DIAGNOSIS — I2510 Atherosclerotic heart disease of native coronary artery without angina pectoris: Secondary | ICD-10-CM | POA: Diagnosis not present

## 2018-12-10 DIAGNOSIS — Z96653 Presence of artificial knee joint, bilateral: Secondary | ICD-10-CM | POA: Diagnosis present

## 2018-12-10 DIAGNOSIS — I2119 ST elevation (STEMI) myocardial infarction involving other coronary artery of inferior wall: Principal | ICD-10-CM | POA: Diagnosis present

## 2018-12-10 DIAGNOSIS — I10 Essential (primary) hypertension: Secondary | ICD-10-CM | POA: Diagnosis present

## 2018-12-10 DIAGNOSIS — Z955 Presence of coronary angioplasty implant and graft: Secondary | ICD-10-CM

## 2018-12-10 DIAGNOSIS — Z881 Allergy status to other antibiotic agents status: Secondary | ICD-10-CM

## 2018-12-10 DIAGNOSIS — Z8249 Family history of ischemic heart disease and other diseases of the circulatory system: Secondary | ICD-10-CM | POA: Diagnosis not present

## 2018-12-10 DIAGNOSIS — Z7951 Long term (current) use of inhaled steroids: Secondary | ICD-10-CM

## 2018-12-10 DIAGNOSIS — M1990 Unspecified osteoarthritis, unspecified site: Secondary | ICD-10-CM | POA: Diagnosis not present

## 2018-12-10 DIAGNOSIS — E785 Hyperlipidemia, unspecified: Secondary | ICD-10-CM | POA: Diagnosis present

## 2018-12-10 DIAGNOSIS — R52 Pain, unspecified: Secondary | ICD-10-CM | POA: Diagnosis not present

## 2018-12-10 DIAGNOSIS — I213 ST elevation (STEMI) myocardial infarction of unspecified site: Secondary | ICD-10-CM | POA: Diagnosis not present

## 2018-12-10 DIAGNOSIS — I499 Cardiac arrhythmia, unspecified: Secondary | ICD-10-CM | POA: Diagnosis not present

## 2018-12-10 DIAGNOSIS — R079 Chest pain, unspecified: Secondary | ICD-10-CM | POA: Diagnosis not present

## 2018-12-10 HISTORY — PX: LEFT HEART CATH AND CORONARY ANGIOGRAPHY: CATH118249

## 2018-12-10 HISTORY — PX: CORONARY/GRAFT ACUTE MI REVASCULARIZATION: CATH118305

## 2018-12-10 LAB — TROPONIN I
Troponin I: 0.05 ng/mL (ref <0.03)
Troponin I: 3.19 ng/mL (ref <0.03)

## 2018-12-10 LAB — CBC WITH DIFFERENTIAL/PLATELET
Abs Immature Granulocytes: 0.03 10*3/uL (ref 0.00–0.07)
Basophils Absolute: 0 10*3/uL (ref 0.0–0.1)
Basophils Relative: 1 %
EOS PCT: 3 %
Eosinophils Absolute: 0.2 10*3/uL (ref 0.0–0.5)
HCT: 42.2 % (ref 36.0–46.0)
Hemoglobin: 13.3 g/dL (ref 12.0–15.0)
Immature Granulocytes: 0 %
Lymphocytes Relative: 16 %
Lymphs Abs: 1.2 10*3/uL (ref 0.7–4.0)
MCH: 29.7 pg (ref 26.0–34.0)
MCHC: 31.5 g/dL (ref 30.0–36.0)
MCV: 94.2 fL (ref 80.0–100.0)
Monocytes Absolute: 0.4 10*3/uL (ref 0.1–1.0)
Monocytes Relative: 5 %
Neutro Abs: 5.4 10*3/uL (ref 1.7–7.7)
Neutrophils Relative %: 75 %
Platelets: 224 10*3/uL (ref 150–400)
RBC: 4.48 MIL/uL (ref 3.87–5.11)
RDW: 12.5 % (ref 11.5–15.5)
WBC: 7.2 10*3/uL (ref 4.0–10.5)
nRBC: 0 % (ref 0.0–0.2)

## 2018-12-10 LAB — POCT ACTIVATED CLOTTING TIME
Activated Clotting Time: 395 seconds
Activated Clotting Time: 279 seconds
Activated Clotting Time: 351 seconds

## 2018-12-10 LAB — HEMOGLOBIN A1C
Hgb A1c MFr Bld: 6.3 % — ABNORMAL HIGH (ref 4.8–5.6)
Mean Plasma Glucose: 134.11 mg/dL

## 2018-12-10 LAB — PROTIME-INR
INR: 1 (ref 0.8–1.2)
Prothrombin Time: 13.4 seconds (ref 11.4–15.2)

## 2018-12-10 LAB — POCT I-STAT 7, (LYTES, BLD GAS, ICA,H+H)
Bicarbonate: 25.8 mmol/L (ref 20.0–28.0)
Calcium, Ion: 1.23 mmol/L (ref 1.15–1.40)
HEMATOCRIT: 39 % (ref 36.0–46.0)
Hemoglobin: 13.3 g/dL (ref 12.0–15.0)
O2 Saturation: 99 %
Potassium: 3.8 mmol/L (ref 3.5–5.1)
Sodium: 140 mmol/L (ref 135–145)
TCO2: 27 mmol/L (ref 22–32)
pCO2 arterial: 43.5 mmHg (ref 32.0–48.0)
pH, Arterial: 7.382 (ref 7.350–7.450)
pO2, Arterial: 128 mmHg — ABNORMAL HIGH (ref 83.0–108.0)

## 2018-12-10 LAB — LIPID PANEL
CHOL/HDL RATIO: 4.1 ratio
Cholesterol: 175 mg/dL (ref 0–200)
HDL: 43 mg/dL (ref >40)
LDL Cholesterol: 77 mg/dL (ref 0–99)
Triglycerides: 276 mg/dL — ABNORMAL HIGH (ref <150)
VLDL: 55 mg/dL — ABNORMAL HIGH (ref 0–40)

## 2018-12-10 LAB — APTT: aPTT: 33 seconds (ref 24–36)

## 2018-12-10 LAB — COMPREHENSIVE METABOLIC PANEL
ALK PHOS: 64 U/L (ref 38–126)
ALT: 14 U/L (ref 0–44)
AST: 20 U/L (ref 15–41)
Albumin: 3.7 g/dL (ref 3.5–5.0)
Anion gap: 10 (ref 5–15)
BILIRUBIN TOTAL: 0.7 mg/dL (ref 0.3–1.2)
BUN: 16 mg/dL (ref 8–23)
CO2: 23 mmol/L (ref 22–32)
Calcium: 9.4 mg/dL (ref 8.9–10.3)
Chloride: 107 mmol/L (ref 98–111)
Creatinine, Ser: 1.08 mg/dL — ABNORMAL HIGH (ref 0.44–1.00)
GFR calc Af Amer: 58 mL/min — ABNORMAL LOW (ref >60)
GFR calc non Af Amer: 50 mL/min — ABNORMAL LOW (ref >60)
Glucose, Bld: 176 mg/dL — ABNORMAL HIGH (ref 70–99)
Potassium: 3.7 mmol/L (ref 3.5–5.1)
Sodium: 140 mmol/L (ref 135–145)
TOTAL PROTEIN: 6.3 g/dL — AB (ref 6.5–8.1)

## 2018-12-10 LAB — GLUCOSE, CAPILLARY: Glucose-Capillary: 175 mg/dL — ABNORMAL HIGH (ref 70–99)

## 2018-12-10 LAB — MRSA PCR SCREENING: MRSA by PCR: NEGATIVE

## 2018-12-10 SURGERY — CORONARY/GRAFT ACUTE MI REVASCULARIZATION
Anesthesia: LOCAL

## 2018-12-10 MED ORDER — MIDAZOLAM HCL 2 MG/2ML IJ SOLN
INTRAMUSCULAR | Status: DC | PRN
  Administered 2018-12-10: 1 mg via INTRAVENOUS

## 2018-12-10 MED ORDER — SODIUM CHLORIDE 0.9 % IV SOLN: 250.0000 mL | INTRAVENOUS | Status: DC | PRN

## 2018-12-10 MED ORDER — HYDROCODONE-ACETAMINOPHEN 7.5-325 MG PO TABS: 1.0000 | ORAL_TABLET | ORAL | Status: DC | PRN

## 2018-12-10 MED ORDER — LIDOCAINE HCL (PF) 1 % IJ SOLN
INTRAMUSCULAR | Status: DC | PRN
  Administered 2018-12-10: 2 mL via INTRADERMAL

## 2018-12-10 MED ORDER — SODIUM CHLORIDE 0.9% FLUSH: 3.0000 mL | INTRAVENOUS | Status: DC | PRN

## 2018-12-10 MED ORDER — HEPARIN SODIUM (PORCINE) 1000 UNIT/ML IJ SOLN
INTRAMUSCULAR | Status: DC | PRN
  Administered 2018-12-10: 2000 [IU] via INTRAVENOUS
  Administered 2018-12-10: 9000 [IU] via INTRAVENOUS

## 2018-12-10 MED ORDER — CARVEDILOL 6.25 MG PO TABS
6.2500 mg | ORAL_TABLET | Freq: Two times a day (BID) | ORAL | Status: DC
  Administered 2018-12-10 – 2018-12-12 (×4): 6.25 mg via ORAL
  Filled 2018-12-10 (×4): qty 1

## 2018-12-10 MED ORDER — SODIUM CHLORIDE 0.9% FLUSH
3.0000 mL | Freq: Two times a day (BID) | INTRAVENOUS | Status: DC
  Administered 2018-12-11: 3 mL via INTRAVENOUS
  Administered 2018-12-11: 10 mL via INTRAVENOUS

## 2018-12-10 MED ORDER — HEPARIN (PORCINE) IN NACL 1000-0.9 UT/500ML-% IV SOLN
INTRAVENOUS | Status: AC
  Filled 2018-12-10: qty 500

## 2018-12-10 MED ORDER — IOHEXOL 350 MG/ML SOLN
INTRAVENOUS | Status: DC | PRN
  Administered 2018-12-10: 225 mL via INTRACARDIAC

## 2018-12-10 MED ORDER — SODIUM CHLORIDE 0.9 % IV SOLN
INTRAVENOUS | Status: AC | PRN
  Administered 2018-12-10 (×2): 50 mL/h via INTRAVENOUS

## 2018-12-10 MED ORDER — ENOXAPARIN SODIUM 40 MG/0.4ML ~~LOC~~ SOLN
40.0000 mg | SUBCUTANEOUS | Status: DC
  Administered 2018-12-11 – 2018-12-12 (×2): 40 mg via SUBCUTANEOUS
  Filled 2018-12-10 (×2): qty 0.4

## 2018-12-10 MED ORDER — ACETAMINOPHEN 325 MG PO TABS: 650.0000 mg | ORAL_TABLET | ORAL | Status: DC | PRN

## 2018-12-10 MED ORDER — FLUTICASONE PROPIONATE 50 MCG/ACT NA SUSP: 2.0000 | Freq: Every day | NASAL | Status: DC | PRN

## 2018-12-10 MED ORDER — ASPIRIN EC 81 MG PO TBEC
81.0000 mg | DELAYED_RELEASE_TABLET | Freq: Every day | ORAL | Status: DC
  Administered 2018-12-11 – 2018-12-12 (×2): 81 mg via ORAL
  Filled 2018-12-10 (×2): qty 1

## 2018-12-10 MED ORDER — NITROGLYCERIN IN D5W 200-5 MCG/ML-% IV SOLN
INTRAVENOUS | Status: AC
  Filled 2018-12-10: qty 250

## 2018-12-10 MED ORDER — NITROGLYCERIN IN D5W 200-5 MCG/ML-% IV SOLN
0.0000 ug/min | INTRAVENOUS | Status: DC
  Administered 2018-12-10: 14 ug/min via INTRAVENOUS

## 2018-12-10 MED ORDER — HEPARIN (PORCINE) IN NACL 1000-0.9 UT/500ML-% IV SOLN
INTRAVENOUS | Status: DC | PRN
  Administered 2018-12-10 (×2): 500 mL

## 2018-12-10 MED ORDER — NITROGLYCERIN 0.2 MG/ML ON CALL CATH LAB
INTRAVENOUS | Status: AC
  Filled 2018-12-10: qty 1

## 2018-12-10 MED ORDER — FERROUS SULFATE 325 (65 FE) MG PO TABS
325.0000 mg | ORAL_TABLET | Freq: Three times a day (TID) | ORAL | Status: DC
  Administered 2018-12-11 – 2018-12-12 (×4): 325 mg via ORAL
  Filled 2018-12-10 (×4): qty 1

## 2018-12-10 MED ORDER — HEPARIN SODIUM (PORCINE) 1000 UNIT/ML IJ SOLN
INTRAMUSCULAR | Status: AC
  Filled 2018-12-10: qty 1

## 2018-12-10 MED ORDER — TICAGRELOR 90 MG PO TABS
ORAL_TABLET | ORAL | Status: DC | PRN
  Administered 2018-12-10: 180 mg via ORAL

## 2018-12-10 MED ORDER — FENTANYL CITRATE (PF) 100 MCG/2ML IJ SOLN
INTRAMUSCULAR | Status: DC | PRN
  Administered 2018-12-10: 25 ug via INTRAVENOUS
  Administered 2018-12-10: 50 ug via INTRAVENOUS

## 2018-12-10 MED ORDER — ONDANSETRON HCL 4 MG/2ML IJ SOLN: 4.0000 mg | Freq: Four times a day (QID) | INTRAMUSCULAR | Status: DC | PRN

## 2018-12-10 MED ORDER — LISINOPRIL 10 MG PO TABS
10.0000 mg | ORAL_TABLET | Freq: Every day | ORAL | Status: DC
  Administered 2018-12-10 – 2018-12-12 (×3): 10 mg via ORAL
  Filled 2018-12-10 (×3): qty 1

## 2018-12-10 MED ORDER — LEVOTHYROXINE SODIUM 100 MCG PO TABS
100.0000 ug | ORAL_TABLET | Freq: Every day | ORAL | Status: DC
  Administered 2018-12-11 – 2018-12-12 (×2): 100 ug via ORAL
  Filled 2018-12-10 (×2): qty 1

## 2018-12-10 MED ORDER — MIDAZOLAM HCL 2 MG/2ML IJ SOLN
INTRAMUSCULAR | Status: AC
  Filled 2018-12-10: qty 2

## 2018-12-10 MED ORDER — ACETAMINOPHEN 325 MG PO TABS
650.0000 mg | ORAL_TABLET | ORAL | Status: DC | PRN
  Administered 2018-12-10: 650 mg via ORAL
  Filled 2018-12-10: qty 2

## 2018-12-10 MED ORDER — VERAPAMIL HCL 2.5 MG/ML IV SOLN
INTRAVENOUS | Status: AC
  Filled 2018-12-10: qty 2

## 2018-12-10 MED ORDER — VERAPAMIL HCL 2.5 MG/ML IV SOLN
INTRAVENOUS | Status: DC | PRN
  Administered 2018-12-10: 10 mL via INTRA_ARTERIAL

## 2018-12-10 MED ORDER — ATORVASTATIN CALCIUM 80 MG PO TABS
80.0000 mg | ORAL_TABLET | Freq: Every day | ORAL | Status: DC
  Administered 2018-12-10 – 2018-12-11 (×2): 80 mg via ORAL
  Filled 2018-12-10 (×2): qty 1

## 2018-12-10 MED ORDER — INSULIN ASPART 100 UNIT/ML ~~LOC~~ SOLN
0.0000 [IU] | Freq: Three times a day (TID) | SUBCUTANEOUS | Status: DC
  Administered 2018-12-11: 2 [IU] via SUBCUTANEOUS
  Administered 2018-12-11: 3 [IU] via SUBCUTANEOUS
  Administered 2018-12-11 – 2018-12-12 (×2): 2 [IU] via SUBCUTANEOUS

## 2018-12-10 MED ORDER — NITROGLYCERIN 1 MG/10 ML FOR IR/CATH LAB
INTRA_ARTERIAL | Status: AC
  Filled 2018-12-10: qty 10

## 2018-12-10 MED ORDER — SODIUM CHLORIDE 0.9 % IV SOLN
INTRAVENOUS | Status: AC
  Administered 2018-12-10: 18:00:00 via INTRAVENOUS

## 2018-12-10 MED ORDER — METHOCARBAMOL 500 MG PO TABS: 500.0000 mg | ORAL_TABLET | Freq: Four times a day (QID) | ORAL | Status: DC | PRN

## 2018-12-10 MED ORDER — FENTANYL CITRATE (PF) 100 MCG/2ML IJ SOLN
INTRAMUSCULAR | Status: AC
  Filled 2018-12-10: qty 2

## 2018-12-10 MED ORDER — LIDOCAINE HCL (PF) 1 % IJ SOLN
INTRAMUSCULAR | Status: AC
  Filled 2018-12-10: qty 30

## 2018-12-10 MED ORDER — NITROGLYCERIN IN D5W 200-5 MCG/ML-% IV SOLN
INTRAVENOUS | Status: AC | PRN
  Administered 2018-12-10: 10 ug/min via INTRAVENOUS

## 2018-12-10 MED ORDER — NITROGLYCERIN 1 MG/10 ML FOR IR/CATH LAB
INTRA_ARTERIAL | Status: DC | PRN
  Administered 2018-12-10 (×2): 200 ug via INTRACORONARY

## 2018-12-10 MED ORDER — AMIODARONE HCL 150 MG/3ML IV SOLN
INTRAVENOUS | Status: AC
  Filled 2018-12-10: qty 3

## 2018-12-10 MED ORDER — ONDANSETRON HCL 4 MG/2ML IJ SOLN
INTRAMUSCULAR | Status: DC | PRN
  Administered 2018-12-10: 4 mg via INTRAVENOUS

## 2018-12-10 MED ORDER — TICAGRELOR 90 MG PO TABS
ORAL_TABLET | ORAL | Status: AC
  Filled 2018-12-10: qty 2

## 2018-12-10 MED ORDER — DOCUSATE SODIUM 100 MG PO CAPS
100.0000 mg | ORAL_CAPSULE | Freq: Two times a day (BID) | ORAL | Status: DC
  Administered 2018-12-11 – 2018-12-12 (×3): 100 mg via ORAL
  Filled 2018-12-10 (×3): qty 1

## 2018-12-10 MED ORDER — SODIUM CHLORIDE 0.9% FLUSH
3.0000 mL | Freq: Two times a day (BID) | INTRAVENOUS | Status: DC
  Administered 2018-12-10: 3 mL via INTRAVENOUS

## 2018-12-10 MED ORDER — TICAGRELOR 90 MG PO TABS
90.0000 mg | ORAL_TABLET | Freq: Two times a day (BID) | ORAL | Status: DC
  Administered 2018-12-11 – 2018-12-12 (×4): 90 mg via ORAL
  Filled 2018-12-10 (×4): qty 1

## 2018-12-10 MED ORDER — POLYETHYLENE GLYCOL 3350 17 G PO PACK
17.0000 g | PACK | Freq: Two times a day (BID) | ORAL | Status: DC
  Administered 2018-12-11 (×2): 17 g via ORAL
  Filled 2018-12-10 (×3): qty 1

## 2018-12-10 MED ORDER — ONDANSETRON HCL 4 MG/2ML IJ SOLN
INTRAMUSCULAR | Status: AC
  Filled 2018-12-10: qty 2

## 2018-12-10 MED ORDER — ADULT MULTIVITAMIN W/MINERALS CH
1.0000 | ORAL_TABLET | Freq: Every day | ORAL | Status: DC
  Administered 2018-12-11 – 2018-12-12 (×2): 1 via ORAL
  Filled 2018-12-10 (×2): qty 1

## 2018-12-10 MED ORDER — CLORAZEPATE DIPOTASSIUM 3.75 MG PO TABS: 7.5000 mg | ORAL_TABLET | Freq: Every day | ORAL | Status: DC | PRN

## 2018-12-10 SURGICAL SUPPLY — 25 items
BALLN SAPPHIRE 2.5X12 (BALLOONS) ×2
BALLN SAPPHIRE 2.5X15 (BALLOONS) ×2
BALLN SAPPHIRE ~~LOC~~ 2.75X12 (BALLOONS) ×1 IMPLANT
BALLN ~~LOC~~ EMERGE MR 2.25X15 (BALLOONS) ×2
BALLOON SAPPHIRE 2.5X12 (BALLOONS) IMPLANT
BALLOON SAPPHIRE 2.5X15 (BALLOONS) IMPLANT
BALLOON ~~LOC~~ EMERGE MR 2.25X15 (BALLOONS) IMPLANT
CATH INFINITI 5FR ANG PIGTAIL (CATHETERS) ×1 IMPLANT
CATH LAUNCHER 6FR EBU3.5 (CATHETERS) ×1 IMPLANT
CATH VISTA GUIDE 6FR JR4 (CATHETERS) ×1 IMPLANT
DEVICE RAD COMP TR BAND LRG (VASCULAR PRODUCTS) ×1 IMPLANT
ELECT DEFIB PAD ADLT CADENCE (PAD) ×1 IMPLANT
GLIDESHEATH SLEND SS 6F .021 (SHEATH) ×1 IMPLANT
GUIDEWIRE INQWIRE 1.5J.035X260 (WIRE) IMPLANT
INQWIRE 1.5J .035X260CM (WIRE) ×2
KIT ENCORE 26 ADVANTAGE (KITS) ×1 IMPLANT
KIT HEART LEFT (KITS) ×2 IMPLANT
PACK CARDIAC CATHETERIZATION (CUSTOM PROCEDURE TRAY) ×2 IMPLANT
STENT RESOLUTE ONYX 2.0X18 (Permanent Stent) ×1 IMPLANT
STENT RESOLUTE ONYX 2.5X15 (Permanent Stent) ×1 IMPLANT
SYR MEDRAD MARK 7 150ML (SYRINGE) ×1 IMPLANT
TRANSDUCER W/STOPCOCK (MISCELLANEOUS) ×2 IMPLANT
TUBING CIL FLEX 10 FLL-RA (TUBING) ×2 IMPLANT
WIRE ASAHI FIELDER XT 190CM (WIRE) ×1 IMPLANT
WIRE RUNTHROUGH .014X180CM (WIRE) ×1 IMPLANT

## 2018-12-10 NOTE — H&P (Signed)
Cardiology Admission History and Physical:   Patient ID: Desiree Rasmussen MRN: [id number]; DOB: 11/30/1940   Admission date: 12/10/2018  Primary Care Provider: Seward Carol, MD Primary Cardiologist: Quay Burow, MD  Primary Electrophysiologist:  None   Chief Complaint:  Chest pain, inferior STEMI  Patient Profile:   Desiree Rasmussen is a 78 y.o. female with  past medical history of hypertension, hyperlipidemia, and DM 2 presented with inferior STEMI  History of Present Illness:   Desiree Rasmussen is a 78 year old female with past medical history of hypertension, hyperlipidemia, and DM 2.  She has a significant family history of heart disease.  She had a previous chest pain work-up with a Myoview on 05/01/2012 which showed EF 73%, no significant ischemia.  She presents today to [facility] as a inferior STEMI.  Symptoms started 2 weeks ago with intermittent chest pain.  This morning around 11:00 AM, she had a significant onset of chest discomfort that did not go away.  She took 2 Tylenol at home without relief of symptoms.  Initial EKG obtained by EMS showed inferior ST elevation with corresponding ST depression in the contralateral leads.  Patient was taken urgently to the [facility].  By the time she arrived at [facility], she continued to complain of a 6-7 out of 10 chest pain.  She is planning to undergo emergent cardiac catheterization.   Past Medical History:  Diagnosis Date  . BPPV (benign paroxysmal positional vertigo), left   . Depression   . Family history of heart disease   . GERD (gastroesophageal reflux disease)   . History of nuclear stress test 05/01/2012   dr berry   normal lexiscan nuclear study w/ no ischemia/  normal LV function and wall motion , ef 73%  . Hyperlipidemia   . Hypertension   . Hypothyroidism   . OA (osteoarthritis)   . Type 2 diabetes mellitus ([place])   . Wears contact lenses    left eye only  . Wears partial dentures    upper     Past Surgical History:  Procedure Laterality Date  . CATARACT EXTRACTION W/ INTRAOCULAR LENS  IMPLANT, BILATERAL  06/2018  . [place];  1972  . CYSTECTOMY     sebaceous cyst on breast  . TOTAL KNEE ARTHROPLASTY Right 06/11/2017   Procedure: RIGHT TOTAL KNEE ARTHROPLASTY;  Surgeon: Paralee Cancel, MD;  Location: [facility] ORS;  Service: Orthopedics;  Laterality: Right;  70 mins  . TOTAL KNEE ARTHROPLASTY Left 08/19/2018   Procedure: TOTAL KNEE ARTHROPLASTY;  Surgeon: Paralee Cancel, MD;  Location: [facility] ORS;  Service: Orthopedics;  Laterality: Left;     Medications Prior to Admission: Prior to Admission medications   Medication Sig Start Date End Date Taking? Authorizing Provider  atenolol (TENORMIN) 50 MG tablet Take 50 mg by mouth every morning.  09/04/11   [provider]  atorvastatin (LIPITOR) 10 MG tablet Take 10 mg by mouth every morning.  12/27/17   [provider]  clorazepate (TRANXENE) 7.5 MG tablet Take 7.5 mg by mouth daily as needed for anxiety (vertigo).     [provider]  docusate sodium (COLACE) 100 MG capsule Take 1 capsule (100 mg total) by mouth 2 (two) times daily. 08/19/18   Danae Orleans, PA-C  ferrous sulfate (FERROUSUL) 325 (65 FE) MG tablet Take 1 tablet (325 mg total) by mouth 3 (three) times daily with meals. 08/19/18   Danae Orleans, PA-C  fluticasone (FLONASE) 50  MCG/ACT nasal spray Place 2 sprays into both nostrils daily as needed for allergies or rhinitis.    [provider]  HYDROcodone-acetaminophen (NORCO) 7.5-325 MG tablet Take 1-2 tablets by mouth every 4 (four) hours as needed for moderate pain. 08/19/18   Danae Orleans, PA-C  HYDROcodone-acetaminophen (NORCO) 7.5-325 MG tablet Take 1-2 tablets by mouth every 4 (four) hours as needed for moderate pain. 08/24/18   Danae Orleans, PA-C  liver oil-zinc oxide (DESITIN) 40 % ointment Apply 1 application topically as needed for irritation.    [provider]  metFORMIN (GLUCOPHAGE) 1000 MG tablet Take 500-1,000 mg by mouth daily. Take 500 mg by mouth once daily for 7 days then take 1000 mg once daily    [provider]  methocarbamol (ROBAXIN) 500 MG tablet Take 1 tablet (500 mg total) by mouth every 6 (six) hours as needed for muscle spasms. 08/19/18   Danae Orleans, PA-C  Multiple Vitamins-Minerals (MULTIVITAMIN WITH MINERALS) tablet Take 1 tablet by mouth daily.    [provider]  polyethylene glycol (MIRALAX / GLYCOLAX) packet Take 17 g by mouth 2 (two) times daily. 08/19/18   Danae Orleans, PA-C  SYNTHROID 100 MCG tablet Take 100 mcg by mouth daily before breakfast.  09/04/11   [provider]  triamterene-hydrochlorothiazide (MAXZIDE-25) 37.5-25 MG per tablet Take 0.5 tablets by mouth daily.     [provider]     Allergies:    Allergies  Allergen Reactions  . Lexapro [Escitalopram Oxalate] Diarrhea  . Doxycycline Nausea And Vomiting    Upset stomach  . Erythromycin Other (See Comments)    "upset stomach"  . Macrobid [Nitrofurantoin] Other (See Comments)    Didn't work  . Sulfa Antibiotics Rash    Social History:   Social History   Socioeconomic History  . Marital status: Widowed    Spouse name: Not on file  . Number of children: 2  . Years of education: Not on file  . Highest education level: Not on file  Occupational History  . Not on file  Social Needs  . Financial resource strain: Not on file  . Food insecurity:    Worry: Not on file    Inability: Not on file  . Transportation needs:    Medical: Not on file    Non-medical: Not on file  Tobacco Use  . Smoking status: Never Smoker  . Smokeless tobacco: Never Used  Substance and Sexual Activity  . Alcohol use: No  . Drug use: Never  . Sexual activity: Not on file  Lifestyle  . Physical activity:    Days per week: Not on file    Minutes per session: Not on file  . Stress: Not on file  Relationships  . Social  connections:    Talks on phone: Not on file    Gets together: Not on file    Attends religious service: Not on file    Active member of club or organization: Not on file    Attends meetings of clubs or organizations: Not on file    Relationship status: Not on file  . Intimate partner violence:    Fear of current or ex partner: Not on file    Emotionally abused: Not on file    Physically abused: Not on file    Forced sexual activity: Not on file  Other Topics Concern  . Not on file  Social History Narrative  . Not on file    Family History:  The patient's family history includes Breast cancer (age of onset: 50) in her maternal aunt; Colon cancer in her maternal uncle; Colon cancer (age of onset: 48) in her mother; Heart disease in her brother; Heart disease (age of onset: 5) in her father.    ROS:  Please see the history of present illness.  All other ROS reviewed and negative.     Physical Exam/Data:   Vitals:   12/10/18 1503  SpO2: 100%   No intake or output data in the 24 hours ending 12/10/18 1510 Last 3 Weights 08/19/2018 08/14/2018 08/01/2018  Weight (lbs) 188 lb 185 lb 6 oz 191 lb 12.8 oz  Weight (kg) 85.276 kg 84.086 kg 87 kg     There is no height or weight on file to calculate BMI.  General:  Well nourished, well developed, in no acute distress HEENT: normal Lymph: no adenopathy Neck: no JVD Endocrine:  No thryomegaly Vascular: No carotid bruits; FA pulses 2+ bilaterally without bruits  Cardiac:  normal S1, S2; RRR; no murmur  Lungs:  clear to auscultation bilaterally, no wheezing, rhonchi or rales  Abd: soft, nontender, no hepatomegaly  Ext: no edema Musculoskeletal:  No deformities, BUE and BLE strength normal and equal Skin: warm and dry  Neuro:  CNs 2-12 intact, no focal abnormalities noted Psych:  Normal affect    EKG:  The ECG that was done by EMS and was personally reviewed and demonstrates inferior STEMI  Relevant CV Studies:  Normal Myoview  on 05/01/2012  Laboratory Data:  ChemistryNo results for input(s): NA, K, CL, CO2, GLUCOSE, BUN, CREATININE, CALCIUM, GFRNONAA, GFRAA, ANIONGAP in the last 168 hours.  No results for input(s): PROT, ALBUMIN, AST, ALT, ALKPHOS, BILITOT in the last 168 hours. HematologyNo results for input(s): WBC, RBC, HGB, HCT, MCV, MCH, MCHC, RDW, PLT in the last 168 hours. Cardiac EnzymesNo results for input(s): TROPONINI in the last 168 hours. No results for input(s): TROPIPOC in the last 168 hours.  BNPNo results for input(s): BNP, PROBNP in the last 168 hours.  DDimer No results for input(s): DDIMER in the last 168 hours.  Radiology/Studies:  No results found.  Assessment and Plan:   1. Inferior STEMI: Undergoing emergent cardiac catheterization.  Patient continues to have 6-7 out of 10 chest pain on [facility].  EKG consistent with inferior STEMI.    - Serial troponin.  Obtain echocardiogram  2. Hypertension: Blood pressure stable.  3. Hyperlipidemia: On Lipitor 10 mg daily at home, last lipid panel obtained in October 2017 showed uncontrolled lipid.  Obtain fasting lipid panel.  Start on high-dose statin  4. DM 2: Sliding scale insulin  Severity of Illness: The appropriate patient status for this patient is INPATIENT. Inpatient status is judged to be reasonable and necessary in order to provide the required intensity of service to ensure the patient's safety. The patient's presenting symptoms, physical exam findings, and initial radiographic and laboratory data in the context of their chronic comorbidities is felt to place them at high risk for further clinical deterioration. Furthermore, it is not anticipated that the patient will be medically stable for discharge from the hospital within 2 midnights of admission. The following factors support the patient status of inpatient.   " The patient's presenting symptoms include chest pain. " The worrisome physical exam findings include  benign. " The initial radiographic and laboratory data are worrisome because of inferior STEMI on EKG. " The chronic co-morbidities include HTN, HLD, DM II.   *  I certify that at the point of admission it is my clinical judgment that the patient will require inpatient hospital care spanning beyond 2 midnights from the point of admission due to high intensity of service, high risk for further deterioration and high frequency of surveillance required.*    For questions or updates, please contact [place] Please consult [email or web address] for contact info under        Hilbert Corrigan, [place]  12/10/2018 3:10 PM

## 2018-12-11 ENCOUNTER — Encounter (HOSPITAL_COMMUNITY): Payer: Self-pay | Admitting: Cardiovascular Disease

## 2018-12-11 ENCOUNTER — Inpatient Hospital Stay (HOSPITAL_COMMUNITY): Payer: Medicare Other

## 2018-12-11 DIAGNOSIS — I2119 ST elevation (STEMI) myocardial infarction involving other coronary artery of inferior wall: Secondary | ICD-10-CM

## 2018-12-11 LAB — CBC
HCT: 37.3 % (ref 36.0–46.0)
Hemoglobin: 11.9 g/dL — ABNORMAL LOW (ref 12.0–15.0)
MCH: 30.4 pg (ref 26.0–34.0)
MCHC: 31.9 g/dL (ref 30.0–36.0)
MCV: 95.4 fL (ref 80.0–100.0)
Platelets: 202 10*3/uL (ref 150–400)
RBC: 3.91 MIL/uL (ref 3.87–5.11)
RDW: 12.7 % (ref 11.5–15.5)
WBC: 7.9 10*3/uL (ref 4.0–10.5)
nRBC: 0 % (ref 0.0–0.2)

## 2018-12-11 LAB — GLUCOSE, CAPILLARY
GLUCOSE-CAPILLARY: 123 mg/dL — AB (ref 70–99)
Glucose-Capillary: 132 mg/dL — ABNORMAL HIGH (ref 70–99)
Glucose-Capillary: 144 mg/dL — ABNORMAL HIGH (ref 70–99)
Glucose-Capillary: 164 mg/dL — ABNORMAL HIGH (ref 70–99)

## 2018-12-11 LAB — TROPONIN I
Troponin I: 10.81 ng/mL (ref <0.03)
Troponin I: 23.54 ng/mL (ref <0.03)

## 2018-12-11 LAB — BASIC METABOLIC PANEL
Anion gap: 11 (ref 5–15)
BUN: 18 mg/dL (ref 8–23)
CALCIUM: 8.9 mg/dL (ref 8.9–10.3)
CO2: 23 mmol/L (ref 22–32)
Chloride: 105 mmol/L (ref 98–111)
Creatinine, Ser: 1.17 mg/dL — ABNORMAL HIGH (ref 0.44–1.00)
GFR calc Af Amer: 52 mL/min — ABNORMAL LOW (ref >60)
GFR calc non Af Amer: 45 mL/min — ABNORMAL LOW (ref >60)
Glucose, Bld: 139 mg/dL — ABNORMAL HIGH (ref 70–99)
Potassium: 4.1 mmol/L (ref 3.5–5.1)
Sodium: 139 mmol/L (ref 135–145)

## 2018-12-11 LAB — ECHOCARDIOGRAM COMPLETE
Height: 64 in
Weight: 3000 oz

## 2018-12-11 LAB — POCT I-STAT CREATININE: Creatinine, Ser: 0.9 mg/dL (ref 0.44–1.00)

## 2018-12-11 MED ORDER — LIVING WELL WITH DIABETES BOOK
Freq: Once | Status: AC
  Administered 2018-12-11: 1

## 2018-12-11 MED ORDER — PERFLUTREN LIPID MICROSPHERE
1.0000 mL | INTRAVENOUS | Status: AC | PRN
  Administered 2018-12-11: 4 mL via INTRAVENOUS
  Filled 2018-12-11: qty 10

## 2018-12-11 NOTE — Care Management (Signed)
Brilinta benefits check sent and pending.  Sloane Palmer RN, BSN, NCM-BC, ACM-RN [phone] 

## 2018-12-11 NOTE — Progress Notes (Signed)
Patient transferred to 6E03 in the wheelchair. Belongings with patient and patient's daughter. Receiving RN present on arrival to new room.

## 2018-12-11 NOTE — Progress Notes (Signed)
  Echocardiogram 2D Echocardiogram has been performed with Def[facility] .  Desiree Rasmussen 12/11/2018, 3:28 PM

## 2018-12-11 NOTE — Progress Notes (Signed)
Inpatient Diabetes Program Recommendations  AACE/ADA: New Consensus Statement on Inpatient Glycemic Control (2015)  Target Ranges:  Prepandial:   less than 140 mg/dL      Peak postprandial:   less than 180 mg/dL (1-2 hours)      Critically ill patients:  140 - 180 mg/dL   Lab Results  Component Value Date   GLUCAP 164 (H) 12/11/2018   HGBA1C 6.3 (H) 12/10/2018     Diabetes history: DM2  Outpatient Diabetes medications:Glucophage 500 mg daily  Current orders for Inpatient glycemic control: Novolog correction moderate scale (0-15 unit) tid wc    Diabetes consult noted for diabetes teaching/glucometer use. Met with patient and she stated she was officially diagnosed with DM2 in November when Hgb A1c was 7.4%. She has been taking Metformin and Hgb A1c has decreased to within goal range (6.3%). Reviewed "living well with diabetes" book with patient that she will take home.   Discussed glucometer and the recommendation to check her blood glucose at home every am. She stated she had not been told upon diagnosis that she should be checking her CBG at home.  Informed patient I will request for her MD to order a glucometer at discharge. Also shared with patient and daughter that a glucometer kit may be purchased over the counter at [employer] for $20 if they prefer.   She does follow up with Dr. Delfina Redwood who manages her diabetes. I encouraged her to take log of her CBG values with her to her appointments. All questions answered.   MD - please order glucometer at discharge (includes lancets and strips) Epic # [id number]    Thank you.  -- Will follow during hospitalization.--  Jonna Clark RN, MSN Diabetes Coordinator Inpatient Glycemic Control Team Team Pager: [phone] (8am-5pm)

## 2018-12-11 NOTE — Progress Notes (Signed)
CARDIAC REHAB PHASE I   PRE:  Rate/Rhythm: 73 SR    BP: sitting 121/68    SaO2:   MODE:  Ambulation: 370 ft   POST:  Rate/Rhythm: 86 SR    BP: sitting 148/76     SaO2: 97 ra  Tolerated well, no c/o, slow pace. Return to recliner. Ed completed with pt and daughter. Good reception, understands importance of Brilinta, ASA. Discussed MI, stent, restrictions, diet and ex, NTG, and CRPII. Will refer to [place]. Encouraged more walking today. [phone]   [place], ACSM 12/11/2018 10:44 AM

## 2018-12-11 NOTE — Discharge Instructions (Signed)

## 2018-12-11 NOTE — TOC Benefit Eligibility Note (Signed)
Transition of Care [facility]) Benefit Eligibility Note    Patient Details  Name: Desiree Rasmussen MRN: [id number] Date of Birth: 1941/02/08   Medication/Dose: [place] Olp / Ticagrelor 5m. twice a day.  Covered?: Yes  Tier: Other  Prescription Coverage Preferred Pharmacy: [facility]  Spoke with Person/Company/Phone Number::  Desiree L. W/ [employer] [phone] Co-Pay: Mail order  $446.36 after deductible [facility]  30 day supply $377.55 before deductible has been met.after deductible $30,00   Prior Approval: No  Deductible: Unmet  Additional Notes: MHerminio Commonsw/ [employer] says there is no Generic for Desiree ConversePhone Number: 12/11/2018, 12:12 PM

## 2018-12-11 NOTE — Progress Notes (Signed)
I responded to a [place] to provide Advance Directive information for the patient and her family. I visited the patient's room several times during the day, however, I was not able to make direct contact with her due to medical staff attending to her care. On my most recent visit, I spoke with her daughter and granddaughter. I shared the AD information with them and left the AD for the patient and her family to complete at a later time. I shared that the Chaplain is available to provide additional support as needed or requested.    12/11/18 1500  Clinical Encounter Type  Visited With Family  Visit Type Spiritual support  Referral From Nurse  Consult/Referral To Chaplain  Spiritual Encounters  Spiritual Needs Prayer;Literature    Chaplain Dr Redgie Grayer

## 2018-12-11 NOTE — Progress Notes (Signed)
Nutrition Consult/Education Note  RD consulted for nutrition education regarding a heart healthy/diabetic diet.   Lipid Panel     Component Value Date/Time   CHOL 175 12/10/2018 1500   TRIG 276 (H) 12/10/2018 1500   HDL 43 12/10/2018 1500   CHOLHDL 4.1 12/10/2018 1500   VLDL 55 (H) 12/10/2018 1500   LDLCALC 77 12/10/2018 1500    RD provided "Heart Healthy Consistent Carbohydrate Nutrition Therapy" handout from the Academy of [employer]etics. Reviewed patient's dietary recall. Typically consumes half of her meals at home and the other half out with family.   Some mornings she'll have bacon, an egg or bowl of cereal. Pt bakes her meats, however, adds fat back to her vegetables. Provided examples on ways to decrease sodium and fat intake in diet. Pt reveals she does drink regular sweet tea, water and "sips" on Coke.  Discouraged intake of processed foods and use of salt shaker. Encouraged fresh fruits and vegetables as well as whole grain sources of carbohydrates to maximize fiber intake. Teach back method used.  Pt shares she has double knee replacements therefore her physical activity is limited. She does walk up and down her driveway. She refuses to go to the [facility] (as her MD recommended).  Expect fair compliance.  Body mass index is 32.18 kg/m. Pt meets criteria for Obesity Class I based on current BMI.  Current diet order is Hear Healthy/Carbohydrate Modified. Labs and medications reviewed. No further nutrition interventions warranted at this time.   If additional nutrition issues arise, please re-consult RD.  Arthur Holms, RD, LDN Pager #: [phone] After-Hours Pager #: [phone]

## 2018-12-11 NOTE — Progress Notes (Signed)
Progress Note Patient Name: Desiree Rasmussen Date of Encounter: 12/11/2018  Primary Cardiologist: Quay Burow, MD   Subjective   Post op day # 1 Inf STEMI. Denies CP  Inpatient Medications    Scheduled Meds: . aspirin EC  81 mg Oral Daily  . atorvastatin  80 mg Oral q1800  . carvedilol  6.25 mg Oral BID WC  . docusate sodium  100 mg Oral BID  . enoxaparin (LOVENOX) injection  40 mg Subcutaneous Q24H  . ferrous sulfate  325 mg Oral TID WC  . insulin aspart  0-15 Units Subcutaneous TID WC  . levothyroxine  100 mcg Oral QAC breakfast  . lisinopril  10 mg Oral Daily  . multivitamin with minerals  1 tablet Oral Daily  . polyethylene glycol  17 g Oral BID  . sodium chloride flush  3 mL Intravenous Q12H  . sodium chloride flush  3 mL Intravenous Q12H  . ticagrelor  90 mg Oral BID   Continuous Infusions: . sodium chloride    . sodium chloride    . nitroGLYCERIN Stopped (12/11/18 0004)   PRN Meds: sodium chloride, sodium chloride, acetaminophen, clorazepate, fluticasone, HYDROcodone-acetaminophen, methocarbamol, ondansetron (ZOFRAN) IV, sodium chloride flush, sodium chloride flush   Vital Signs    Vitals:   12/11/18 0400 12/11/18 0500 12/11/18 0600 12/11/18 0700  BP: 108/60 (!) 109/54 107/69 (!) 121/106  Pulse: (!) 59 62 62 63  Resp: (!) 21 (!) 22 20 18   Temp: 98.4 F (36.9 C)   98.4 F (36.9 C)  TempSrc: Oral   Oral  SpO2: 97% 96% 93% 96%  Weight:   85 kg   Height:        Intake/Output Summary (Last 24 hours) at 12/11/2018 0802 Last data filed at 12/11/2018 0600 Gross per 24 hour  Intake 1043.49 ml  Output 450 ml  Net 593.49 ml   Last 3 Weights 12/11/2018 12/10/2018 12/10/2018  Weight (lbs) 187 lb 8 oz 186 lb 15.2 oz 186 lb 15.2 oz  Weight (kg) 85.049 kg 84.8 kg 84.8 kg      Telemetry    NSR - Personally Reviewed  ECG    SR 68 with inff MI and TWI - Personally Reviewed  Physical Exam   GEN: No acute distress.   Neck: No JVD Cardiac: RRR, no murmurs,  rubs, or gallops.  Respiratory: Clear to auscultation bilaterally. GI: Soft, nontender, non-distended  MS: No edema; No deformity. Neuro:  Nonfocal  Psych: Normal affect   Labs    Chemistry Recent Labs  Lab 12/10/18 1500 12/10/18 1511 12/11/18 0112  NA 140 140 139  K 3.7 3.8 4.1  CL 107  --  105  CO2 23  --  23  GLUCOSE 176*  --  139*  BUN 16  --  18  CREATININE 1.08*  --  1.17*  CALCIUM 9.4  --  8.9  PROT 6.3*  --   --   ALBUMIN 3.7  --   --   AST 20  --   --   ALT 14  --   --   ALKPHOS 64  --   --   BILITOT 0.7  --   --   GFRNONAA 50*  --  45*  GFRAA 58*  --  52*  ANIONGAP 10  --  11     Hematology Recent Labs  Lab 12/10/18 1500 12/10/18 1511 12/11/18 0112  WBC 7.2  --  7.9  RBC 4.48  --  3.91  HGB 13.3 13.3 11.9*  HCT 42.2 39.0 37.3  MCV 94.2  --  95.4  MCH 29.7  --  30.4  MCHC 31.5  --  31.9  RDW 12.5  --  12.7  PLT 224  --  202    Cardiac Enzymes Recent Labs  Lab 12/10/18 1500 12/10/18 2047 12/11/18 0112  TROPONINI 0.05* 3.19* 10.81*   No results for input(s): TROPIPOC in the last 168 hours.   BNPNo results for input(s): BNP, PROBNP in the last 168 hours.   DDimer No results for input(s): DDIMER in the last 168 hours.   Radiology    No results found.  Cardiac Studies   Cath yesterday. PCI DES LAD  Patient Profile     78 y.o. female without prior CAD admitted with INF ST elev. She has a H/O DM, HTN, HLD  Assessment & Plan    1. CAD- POST -OP day # 1 Inf STEMI Rx with failed attempt at RCA intervention secondary to prob CTO. Had PCI/DES Prox/ Mid LAD  Which provided L---> R collat to RCA. No CP this AM. Trop went up 10  2. ISCM- EF 40-45% on approp meds  3. HLD- On HD statin  Tx tele. [facility]. Prob DC home AM. Med Rx of RCA for now  For questions or updates, please contact [facility] Please consult [email or web address] for contact info under        Signed, Quay Burow, MD  12/11/2018, 8:02 AM

## 2018-12-12 LAB — BASIC METABOLIC PANEL
Anion gap: 10 (ref 5–15)
BUN: 15 mg/dL (ref 8–23)
CO2: 24 mmol/L (ref 22–32)
Calcium: 9.1 mg/dL (ref 8.9–10.3)
Chloride: 106 mmol/L (ref 98–111)
Creatinine, Ser: 0.98 mg/dL (ref 0.44–1.00)
GFR calc Af Amer: >60 mL/min (ref >60)
GFR calc non Af Amer: 56 mL/min — ABNORMAL LOW (ref >60)
Glucose, Bld: 124 mg/dL — ABNORMAL HIGH (ref 70–99)
Potassium: 3.7 mmol/L (ref 3.5–5.1)
Sodium: 140 mmol/L (ref 135–145)

## 2018-12-12 LAB — GLUCOSE, CAPILLARY: Glucose-Capillary: 131 mg/dL — ABNORMAL HIGH (ref 70–99)

## 2018-12-12 MED ORDER — ATORVASTATIN CALCIUM 80 MG PO TABS: 80.0000 mg | ORAL_TABLET | Freq: Every day | ORAL | 11 refills | Status: DC

## 2018-12-12 MED ORDER — NITROGLYCERIN 0.4 MG SL SUBL: 0.4000 mg | SUBLINGUAL_TABLET | SUBLINGUAL | 12 refills | Status: DC | PRN

## 2018-12-12 MED ORDER — LISINOPRIL 10 MG PO TABS: 10.0000 mg | ORAL_TABLET | Freq: Every day | ORAL | 11 refills | Status: DC

## 2018-12-12 MED ORDER — NITROGLYCERIN 0.3 MG SL SUBL: 0.3000 mg | SUBLINGUAL_TABLET | SUBLINGUAL | 1 refills | Status: DC | PRN

## 2018-12-12 MED ORDER — CARVEDILOL 6.25 MG PO TABS: 6.2500 mg | ORAL_TABLET | Freq: Two times a day (BID) | ORAL | 11 refills | Status: DC

## 2018-12-12 MED ORDER — TICAGRELOR 90 MG PO TABS: 90.0000 mg | ORAL_TABLET | Freq: Two times a day (BID) | ORAL | 11 refills | Status: DC

## 2018-12-12 MED FILL — LISINOPRIL 10 MG TABS: 10 | 30 days supply | Qty: 30 | Fill #0 | Status: TO

## 2018-12-12 MED FILL — CARVEDILOL 6.25 MG TABLET: 6.25 | 30 days supply | Qty: 60 | Fill #0 | Status: TO

## 2018-12-12 MED FILL — BRILINTA 90 MG TABLET: 90 | 30 days supply | Qty: 60 | Fill #0 | Status: TO

## 2018-12-12 MED FILL — NITROGLYCERIN 0.4 MG TAB SL: 0.4 | 7 days supply | Qty: 25 | Fill #0 | Status: TO

## 2018-12-12 NOTE — Progress Notes (Signed)
CARDIAC REHAB PHASE I   PRE:  Rate/Rhythm: 91 SR    BP: sitting 119/72    SaO2:   MODE:  Ambulation: 430 ft   POST:  Rate/Rhythm: 108 ST    BP: sitting 130/69     SaO2:   Pt with increased SOB walking this am, probably due to increased pace/HR. Resolved with rest. No questions.  [id number]  [place], ACSM 12/12/2018 10:03 AM

## 2018-12-12 NOTE — Discharge Summary (Addendum)
Discharge Summary    Patient ID: Desiree Rasmussen MRN: [id number]; DOB: 1940-12-24  Admit date: 12/10/2018 Discharge date: 12/12/2018  Primary Care Provider: Seward Carol, MD  Primary Cardiologist: Quay Burow, MD   Discharge Diagnoses    Active Problems:   Acute ST elevation myocardial infarction (STEMI) of inferior wall (HCC)   HTN  HLD  DM  Allergies Allergies  Allergen Reactions  . Lexapro [Escitalopram Oxalate] Diarrhea  . Doxycycline Nausea And Vomiting    Upset stomach, also  . Erythromycin Other (See Comments)    "upset stomach"  . Erythromycin Base Nausea Only  . Hydrocodone Other (See Comments)    Affected mood negatively  . Macrobid [Nitrofurantoin] Other (See Comments)    Didn't work  . Sulfa Antibiotics Rash    Diagnostic Studies/Procedures    CORONARY/GRAFT ACUTE MI REVASCULARIZATION  LEFT HEART CATH AND CORONARY ANGIOGRAPHY  Conclusion     Prox RCA to Mid RCA lesion is 100% stenosed.  Prox LAD lesion is 99% stenosed.  Post intervention, there is a 0% residual stenosis.  A drug-eluting stent was successfully placed using a STENT RESOLUTE ONYX 2.5X15.  Ost 1st Diag lesion is 40% stenosed.  Mid LAD lesion is 80% stenosed.  Post intervention, there is a 5% residual stenosis.  A drug-eluting stent was successfully placed using a STENT RESOLUTE ONYX 2.0X18.  Mid Cx lesion is 50% stenosed.  LV end diastolic pressure is mildly elevated.  The left ventricular ejection fraction is 35-45% by visual estimate.  There is moderate left ventricular systolic dysfunction.   1.  Severe two-vessel coronary artery disease.  The patient presented with inferior ST elevation myocardial infarction with symptoms that started about 2 weeks prior to presentation.  The culprit for her STEMI is likely an acute on chronic occlusion in the RCA.  There is also high-grade stenosis in the proximal LAD and mid LAD.  Moderate disease in the left circumflex. 2.   Moderately reduced LV systolic function with an EF of 35 to 40%.  Mildly elevated left ventricular end-diastolic pressure. 3. Inability to cross the occlusion in the right coronary artery. 4. Successful angioplasty and drug-eluting stent placement to the LAD x2.  Recommendations: Dual antiplatelet therapy for at least 1 year. The patient will likely finish an inferior infarct but she has reasonable left-to-right collaterals. The patient's chest pain improved at the end of the procedure but as expected, did not resolve and thus I elected to start her on nitroglycerin drip.  She also continued to have inferior ST elevation in her EKG and this is also expected.   Diagnostic  Dominance: Right    Intervention       Echo 12/11/18  1. Moderate hypokinesis of the left ventricular, basal-mid inferior wall.  2. The left ventricle has mildly reduced systolic function, with an ejection fraction of 45-50%. The cavity size was normal. There is mildly increased left ventricular wall thickness. Left ventricular diastolic Doppler parameters are consistent with  impaired relaxation.  3. The right ventricle has normal systolic function. The cavity was normal. There is no increase in right ventricular wall thickness.  4. The aortic valve is tricuspid Moderate thickening of the aortic valve Moderate calcification of the aortic valve  History of Present Illness     Desiree Rasmussen is a 78 y.o. female with  past medical history of hypertension, hyperlipidemia, and DM 2 presented with inferior STEMI.  She has a significant family history of heart disease.  She had a previous  chest pain work-up with a Myoview on 05/01/2012 which showed EF 73%, no significant ischemia.  She presented to [facility] as a inferior STEMI 12/10/18.  Symptoms started 2 weeks ago with intermittent chest pain.  She had a significant onset of chest discomfort that did not go away.  She took 2 Tylenol at home without relief of  symptoms.  Initial EKG obtained by EMS showed inferior ST elevation with corresponding ST depression in the contralateral leads.  Patient was taken urgently to the [facility].  By the time she arrived at [facility], she continued to complain of a 6-7 out of 10 chest pain.  She is planning to undergo emergent cardiac catheterization.   Hospital Course     Consultants: None  1. Inferior STEMI/CAD - The patient underwent emergent cardiac catheterization via the right radial artery.  She was found to have occluded proximal right coronary artery with left-to-right collaterals and high-grade stenosis in the proximal and mid LAD.  There was moderate left circumflex disease. Attempted PCI of the right coronary artery which was felt to be the culprit. However, could not cross the occlusion in spite of multiple attempts with 2 wires. It was felt that this occlusion is likely acute on chronic especially with her stuttering symptoms for 2 weeks. Decided to treat the high-grade stenosis in the LAD which provides collaterals to the RCA.  This was done with placement of 2 drug-eluting stents with excellent results. Troponin trended to 23. No recurrent chest pain. Walked with cardiac rehab without symptoms. Continue DAPT.   2. ICM - LVEF of 40-45%. Moderate hypokinesis of the left ventricular, basal-mid inferior wall. - Euvolemic - Continue BB and ARB  3. HLD - 12/10/2018: Cholesterol 175; HDL 43; LDL Cholesterol 77; Triglycerides 276; VLDL 55  - Continue statin   4. DM - Resume home metformin.  - treated with SSI while here. HgbA1c 6.3.  The patient been seen by Dr. Gwenlyn Found today and deemed ready for discharge home. All follow-up appointments have been scheduled. Discharge medications are listed below.   Discharge Vitals Blood pressure 115/83, pulse 80, temperature 99 F (37.2 C), temperature source Oral, resp. rate 20, height 5\' 4"  (1.626 m), weight 85.4 kg, SpO2 98 %.  Filed Weights    12/11/18 0600 12/11/18 1650 12/12/18 0623  Weight: 85 kg 85 kg 85.4 kg    Labs & Radiologic Studies    CBC Recent Labs    12/10/18 1500 12/10/18 1511 12/11/18 0112  WBC 7.2  --  7.9  NEUTROABS 5.4  --   --   HGB 13.3 13.3 11.9*  HCT 42.2 39.0 37.3  MCV 94.2  --  95.4  PLT 224  --  829   Basic Metabolic Panel Recent Labs    12/11/18 0112 12/12/18 0358  NA 139 140  K 4.1 3.7  CL 105 106  CO2 23 24  GLUCOSE 139* 124*  BUN 18 15  CREATININE 1.17* 0.98  CALCIUM 8.9 9.1   Liver Function Tests Recent Labs    12/10/18 1500  AST 20  ALT 14  ALKPHOS 64  BILITOT 0.7  PROT 6.3*  ALBUMIN 3.7   Cardiac Enzymes Recent Labs    12/10/18 2047 12/11/18 0112 12/11/18 0745  TROPONINI 3.19* 10.81* 23.54*   BNP Hemoglobin A1C Recent Labs    12/10/18 1501  HGBA1C 6.3*   Fasting Lipid Panel Recent Labs    12/10/18 1500  CHOL 175  HDL 43  LDLCALC 77  TRIG 276*  CHOLHDL 4.1   Thyroid Function Tests  Disposition   Pt is being discharged home today in good condition.  Follow-up Plans & Appointments    Follow-up Information    Almyra Deforest, [place]. Go on 12/26/2018.   Specialties:  Cardiology, Radiology Why:  @10 :00am for hospital follow up  Contact information: [address] [place] [address] [phone]          Discharge Instructions    Amb Referral to Cardiac Rehabilitation   Complete by:  As directed    Diagnosis:   Coronary Stents STEMI PTCA     Diet - low sodium heart healthy   Complete by:  As directed    Discharge instructions   Complete by:  As directed    No driving for 2 weeks. No lifting over 10 lbs for 4 weeks. No sexual activity for 4 weeks. You may not return to work until cleared by your cardiologist.  Keep procedure site clean & dry. If you notice increased pain, swelling, bleeding or pus, call/return!  You may shower, but no soaking baths/hot tubs/pools for 1 week.   Increase activity slowly   Complete by:  As  directed       Discharge Medications   Allergies as of 12/12/2018      Reactions   Lexapro [escitalopram Oxalate] Diarrhea   Doxycycline Nausea And Vomiting   Upset stomach, also   Erythromycin Other (See Comments)   "upset stomach"   Erythromycin Base Nausea Only   Hydrocodone Other (See Comments)   Affected mood negatively   Macrobid [nitrofurantoin] Other (See Comments)   Didn't work   Sulfa Antibiotics Rash      Medication List    STOP taking these medications   amoxicillin 500 MG capsule Commonly known as:  AMOXIL   atenolol 50 MG tablet Commonly known as:  TENORMIN   ibuprofen 200 MG tablet Commonly known as:  ADVIL,MOTRIN   meloxicam 15 MG tablet Commonly known as:  MOBIC   triamterene-hydrochlorothiazide 37.5-25 MG tablet Commonly known as:  MAXZIDE-25     TAKE these medications   acetaminophen 500 MG tablet Commonly known as:  TYLENOL Take 500-1,000 mg by mouth every 8 (eight) hours as needed for mild pain, moderate pain or headache.   aspirin EC 81 MG tablet Take 81 mg by mouth daily.   atorvastatin 80 MG tablet Commonly known as:  LIPITOR Take 1 tablet (80 mg total) by mouth daily at 6 PM. What changed:    medication strength  how much to take  when to take this   carvedilol 6.25 MG tablet Commonly known as:  COREG Take 1 tablet (6.25 mg total) by mouth 2 (two) times daily with a meal.   clorazepate 7.5 MG tablet Commonly known as:  TRANXENE Take 7.5 mg by mouth See admin instructions. Take 7.5 mg by mouth at bedtime and an additional 7.5 mg once a day as needed for anxiousness   docusate sodium 100 MG capsule Commonly known as:  Colace Take 1 capsule (100 mg total) by mouth 2 (two) times daily. What changed:    when to take this  reasons to take this   fluticasone 50 MCG/ACT nasal spray Commonly known as:  FLONASE Place 2 sprays into both nostrils daily as needed for allergies or rhinitis.   lisinopril 10 MG tablet Commonly  known as:  PRINIVIL,ZESTRIL Take 1 tablet (10 mg total) by mouth daily. Start taking on:  December 13, 2018   liver oil-zinc oxide 40 % ointment Commonly known as:  DESITIN Apply 1 application topically as needed for irritation (UNDER THE BREASTS AND BETWEEN SKIN FOLDS).   loratadine 10 MG tablet Commonly known as:  CLARITIN Take 10 mg by mouth daily as needed for allergies or rhinitis.   metFORMIN 500 MG 24 hr tablet Commonly known as:  GLUCOPHAGE-XR Take 500 mg by mouth daily.   multivitamin with minerals tablet Take 1 tablet by mouth 3 (three) times a week.   nitroGLYCERIN 0.3 MG SL tablet Commonly known as:  Nitrostat Place 1 tablet (0.3 mg total) under the tongue every 5 (five) minutes as needed for chest pain.   polyethylene glycol packet Commonly known as:  MIRALAX / GLYCOLAX Take 17 g by mouth 2 (two) times daily.   Refresh Optive PF 0.5-0.9 % Soln Generic drug:  Carboxymethylcell-Glycerin PF Place 1-2 drops into both eyes 3 (three) times daily as needed (for irritation).   Synthroid 100 MCG tablet Generic drug:  levothyroxine Take 100 mcg by mouth daily before breakfast.   ticagrelor 90 MG Tabs tablet Commonly known as:  BRILINTA Take 1 tablet (90 mg total) by mouth 2 (two) times daily.   vitamin C 500 MG tablet Commonly known as:  ASCORBIC ACID Take 500-1,000 mg by mouth daily.        Acute coronary syndrome (MI, NSTEMI, STEMI, etc) this admission?: Yes.     AHA/ACC Clinical Performance & Quality Measures: 1. Aspirin prescribed? - Yes 2. ADP Receptor Inhibitor (Plavix/Clopidogrel, Brilinta/Ticagrelor or Effient/Prasugrel) prescribed (includes medically managed patients)? - Yes 3. Beta Blocker prescribed? - Yes 4. High Intensity Statin (Lipitor 40-80mg  or Crestor 20-40mg ) prescribed? - Yes 5. EF assessed during THIS hospitalization? - Yes 6. For EF <40%, was ACEI/ARB prescribed? - Yes 7. For EF <40%, Aldosterone Antagonist (Spironolactone or Eplerenone)  prescribed? - Not Applicable (EF >/= 76%) 8. Cardiac Rehab Phase II ordered (Included Medically managed Patients)? - Yes     Outstanding Labs/Studies   Lipid panel and LFTs in 6 weeks  Duration of Discharge Encounter   Greater than 30 minutes including physician time.  Signed, Leanor Kail, PA 12/12/2018, 9:55 AM  Agree with note by Robbie Lis PA-C  Postop day 2 inferior STEMI treated with PCI and drug-eluting stenting of the proximal and mid LAD with synergy drug-eluting stents by Dr. Velva Harman.  She has a RCA CTO which we will treat medically.  She is ambulated without difficulty.  She denies chest pain or shortness of breath.  She can be discharged home on DAPT with outpatient follow-up arranged with me in 3 to 4 weeks.  Lorretta Harp, M.D., [place], [facility], [place] [address] [place],   [address]  [phone] 12/12/2018 10:10 AM

## 2018-12-12 NOTE — Progress Notes (Signed)
Progress Note Patient Name: Desiree Rasmussen Date of Encounter: 12/12/2018  Primary Cardiologist: Quay Burow, MD   Subjective   Post op day # 2 Inf STEMI status post proximal and mid LAD PCI and drug-eluting stenting by Dr. Fletcher Anon, denies CP  Inpatient Medications    Scheduled Meds: . aspirin EC  81 mg Oral Daily  . atorvastatin  80 mg Oral q1800  . carvedilol  6.25 mg Oral BID WC  . docusate sodium  100 mg Oral BID  . enoxaparin (LOVENOX) injection  40 mg Subcutaneous Q24H  . ferrous sulfate  325 mg Oral TID WC  . insulin aspart  0-15 Units Subcutaneous TID WC  . levothyroxine  100 mcg Oral QAC breakfast  . lisinopril  10 mg Oral Daily  . multivitamin with minerals  1 tablet Oral Daily  . polyethylene glycol  17 g Oral BID  . sodium chloride flush  3 mL Intravenous Q12H  . sodium chloride flush  3 mL Intravenous Q12H  . ticagrelor  90 mg Oral BID   Continuous Infusions: . sodium chloride    . sodium chloride    . nitroGLYCERIN Stopped (12/11/18 0004)   PRN Meds: sodium chloride, sodium chloride, acetaminophen, clorazepate, fluticasone, HYDROcodone-acetaminophen, methocarbamol, ondansetron (ZOFRAN) IV, sodium chloride flush, sodium chloride flush   Vital Signs    Vitals:   12/11/18 2019 12/12/18 0623 12/12/18 0623 12/12/18 0834  BP: (!) 96/52  115/83   Pulse: 72  92 80  Resp: 20     Temp: 98.1 F (36.7 C)  99 F (37.2 C)   TempSrc: Oral  Oral   SpO2: 99%  98%   Weight:  85.4 kg    Height:        Intake/Output Summary (Last 24 hours) at 12/12/2018 0926 Last data filed at 12/11/2018 2100 Gross per 24 hour  Intake 240 ml  Output -  Net 240 ml   Last 3 Weights 12/12/2018 12/11/2018 12/11/2018  Weight (lbs) 188 lb 4.8 oz 187 lb 6.3 oz 187 lb 8 oz  Weight (kg) 85.412 kg 85 kg 85.049 kg      Telemetry    NSR - Personally Reviewed  ECG    SR 68 with inff MI and TWI - Personally Reviewed  Physical Exam   GEN: No acute distress.   Neck: No JVD  Cardiac: RRR, no murmurs, rubs, or gallops.  Respiratory: Clear to auscultation bilaterally. GI: Soft, nontender, non-distended  MS: No edema; No deformity. Neuro:  Nonfocal  Psych: Normal affect   Labs    Chemistry Recent Labs  Lab 12/10/18 1500 12/10/18 1510 12/10/18 1511 12/11/18 0112 12/12/18 0358  NA 140  --  140 139 140  K 3.7  --  3.8 4.1 3.7  CL 107  --   --  105 106  CO2 23  --   --  23 24  GLUCOSE 176*  --   --  139* 124*  BUN 16  --   --  18 15  CREATININE 1.08* 0.90  --  1.17* 0.98  CALCIUM 9.4  --   --  8.9 9.1  PROT 6.3*  --   --   --   --   ALBUMIN 3.7  --   --   --   --   AST 20  --   --   --   --   ALT 14  --   --   --   --   [facility]  64  --   --   --   --   BILITOT 0.7  --   --   --   --   GFRNONAA 50*  --   --  45* 56*  GFRAA 58*  --   --  52* >60  ANIONGAP 10  --   --  11 10     Hematology Recent Labs  Lab 12/10/18 1500 12/10/18 1511 12/11/18 0112  WBC 7.2  --  7.9  RBC 4.48  --  3.91  HGB 13.3 13.3 11.9*  HCT 42.2 39.0 37.3  MCV 94.2  --  95.4  MCH 29.7  --  30.4  MCHC 31.5  --  31.9  RDW 12.5  --  12.7  PLT 224  --  202    Cardiac Enzymes Recent Labs  Lab 12/10/18 1500 12/10/18 2047 12/11/18 0112 12/11/18 0745  TROPONINI 0.05* 3.19* 10.81* 23.54*   No results for input(s): TROPIPOC in the last 168 hours.   BNPNo results for input(s): BNP, PROBNP in the last 168 hours.   DDimer No results for input(s): DDIMER in the last 168 hours.   Radiology    No results found.  Cardiac Studies   Cath yesterday. PCI DES LAD  Patient Profile     78 y.o. female without prior CAD admitted with INF ST elev. She has a H/O DM, HTN, HLD  Assessment & Plan    1. CAD- POST -OP day # 2 Inf STEMI Rx with failed attempt at RCA intervention secondary to prob CTO. Had PCI/DES Prox/ Mid LAD  Which provided L---> R collat to RCA. No CP this AM. Trop went up 10.  Walked with cardiac rehab without symptoms, ready for discharge on DAPT  2. ISCM-  EF 40-45% on approp meds  3. HLD- On HD statin  Ambulating without difficulty.  Denies chest pain or shortness of breath.  Ready for discharge the this a.m. on DAPT and other appropriate current medications.  Follow-up with me in 3 to 4 weeks.  For questions or updates, please contact [place] Please consult [email or web address] for contact info under        Signed, Quay Burow, MD  12/12/2018, 9:26 AM

## 2018-12-12 NOTE — Plan of Care (Signed)
  Problem: Activity: Goal: Ability to tolerate increased activity will improve Outcome: Progressing Note:  Ambulates to bathroom independently without difficulty.   Problem: Cardiac: Goal: Vascular access site(s) Level 0-1 will be maintained Outcome: Progressing Note:  Right radial site is level 0.   Problem: Clinical Measurements: Goal: Will remain free from infection Outcome: Progressing Note:  No s/s of infection noted.

## 2018-12-16 ENCOUNTER — Telehealth: Payer: Self-pay | Admitting: Physician Assistant

## 2018-12-16 ENCOUNTER — Telehealth (HOSPITAL_COMMUNITY): Payer: Self-pay

## 2018-12-16 NOTE — Telephone Encounter (Signed)
Pt insurance is active and benefits verified through Medicare a/b Co-pay 0, DED $198/0 met, out of pocket 0/0 met, co-insurance 20%. no pre-authorization required. [name], 12/16/2018 @ 11:27am, REF# [id number]  Will contact patient to see if she is interested in the Cardiac Rehab Program. If interested, patient will need to complete follow up appt. Once completed, patient will be contacted for scheduling upon review by the RN Navigator. Desiree Rasmussen. Support Rep II

## 2018-12-16 NOTE — Telephone Encounter (Signed)
Patient called back, she states that she has noticed some swelling in her ankles. Patient had a recent left heart cath on 03/11- which was done through radial. Patient states that she did have both knee replaced and noticed issue with one of her knees the other day and then noticed the ankle swelling. Patient states BP yesterday was 141/72, she has no weights as she does not have a scale. Patient denies chest pains, redness in leg area, SOB (nothing new). Patient has recently seen Dr.Olin for her knee and was advised it was okay. Patient has not made any new diet changes, no pitting edema noted and does mention when she sleeps the swelling goes down. Patient was advised to buy a scale and to monitor any weight changes, patient also advised to wear compression stockings to see if that would help. Patient verbalized understanding to call back once she did these things. If swelling did not go down or any weight gain noted. Will route to MD and nurse for any other recommendations.

## 2018-12-16 NOTE — Telephone Encounter (Signed)
Called pt daughter to see if pt is interested in the program, pt daughter stated that pt would call back later on. Tedra [place]. Support Rep II

## 2018-12-16 NOTE — Telephone Encounter (Signed)
New Message   Pt c/o swelling: STAT is pt has developed SOB within 24 hours  1) How much weight have you gained and in what time span? No  2) If swelling, where is the swelling located? Ankle  3) Are you currently taking a fluid pill? NO  4) Are you currently SOB? NO  5) Do you have a log of your daily weights (if so, list)? NO  6) Have you gained 3 pounds in a day or 5 pounds in a week? NO  7) Have you traveled recently? NO

## 2018-12-25 ENCOUNTER — Telehealth: Payer: Self-pay | Admitting: Physician Assistant

## 2018-12-25 NOTE — Telephone Encounter (Signed)
Tomorrow's visit cancelled. She is doing ok since cath. Please arrange televisit for later next week Thur or Fri.

## 2018-12-25 NOTE — Telephone Encounter (Signed)
Patient is scheduled for a telephone visit on Thur 01-01-2019

## 2018-12-26 ENCOUNTER — Ambulatory Visit: Payer: Medicare Other | Admitting: Physician Assistant

## 2018-12-30 ENCOUNTER — Other Ambulatory Visit: Payer: Self-pay

## 2019-01-01 ENCOUNTER — Other Ambulatory Visit: Payer: Self-pay | Admitting: Physician Assistant

## 2019-01-01 ENCOUNTER — Telehealth (INDEPENDENT_AMBULATORY_CARE_PROVIDER_SITE_OTHER): Payer: Medicare Other | Admitting: Physician Assistant

## 2019-01-01 ENCOUNTER — Encounter: Payer: Self-pay | Admitting: Physician Assistant

## 2019-01-01 ENCOUNTER — Other Ambulatory Visit: Payer: Self-pay

## 2019-01-01 ENCOUNTER — Telehealth: Payer: Self-pay

## 2019-01-01 VITALS — BP 134/77 | HR 77 | Wt 183.0 lb

## 2019-01-01 DIAGNOSIS — I10 Essential (primary) hypertension: Secondary | ICD-10-CM | POA: Diagnosis not present

## 2019-01-01 DIAGNOSIS — I2510 Atherosclerotic heart disease of native coronary artery without angina pectoris: Secondary | ICD-10-CM

## 2019-01-01 DIAGNOSIS — E119 Type 2 diabetes mellitus without complications: Secondary | ICD-10-CM | POA: Diagnosis not present

## 2019-01-01 DIAGNOSIS — E785 Hyperlipidemia, unspecified: Secondary | ICD-10-CM | POA: Diagnosis not present

## 2019-01-01 DIAGNOSIS — I255 Ischemic cardiomyopathy: Secondary | ICD-10-CM

## 2019-01-01 MED ORDER — LISINOPRIL 20 MG PO TABS: 20.0000 mg | ORAL_TABLET | Freq: Every day | ORAL | 1 refills | Status: DC

## 2019-01-01 NOTE — Progress Notes (Signed)
Virtual Visit via Telephone Note    Evaluation Performed:  Follow-up visit  This visit type was conducted due to national recommendations for restrictions regarding the COVID-19 Pandemic (e.g. social distancing).  This format is felt to be most appropriate for this patient at this time.  All issues noted in this document were discussed and addressed.  No physical exam was performed (except for noted visual exam findings with Video Visits).  Please refer to the patient's chart (MyChart message for video visits and phone note for telephone visits) for the patient's consent to telehealth for [facility].  Date:  01/01/2019   ID:  Desiree Rasmussen, DOB 07-21-1941, MRN [id number]  Patient Location:  Home  Provider location:   [place]  PCP:  Seward Carol, MD  Cardiologist:  Quay Burow, MD  Electrophysiologist:  None   Chief Complaint:  Post PCI followup  History of Present Illness:    Desiree Rasmussen is a 78 y.o. female who presents via audio/video conferencing for a telehealth visit today.    The patient does not have symptoms concerning for COVID-19 infection (fever, chills, cough, or new shortness of breath).    Prior CV studies:   The following studies were reviewed today:  [name] is a 78 year old female with past medical history of hypertension, hyperlipidemia, and DM 2.  She has a significant family history of heart disease.  She had a previous chest pain work-up with a Myoview on 05/01/2012 which showed EF 73%, no significant ischemia.  She presented to the hospital on 12/10/2018 as a inferior STEMI.  Emergent cardiac catheterization was performed which revealed 100% occluded proximal to mid RCA lesion with left-to-right collateral, 99% proximal LAD lesion treated with resolute Onyx 2.5 x 15 mm DES, 80% mid LAD lesion treated with resolute Onyx 2.0 x 18 mm DES, 50% mid left circumflex residual and 40% ostial D1 residual, EF 35 to 45%.  RCA total occlusion was unable to  be crossed with a wire and therefore managed medically.  It was felt that patient likely had a completed inferior infarct prior to arrival.  Echocardiogram obtained on 12/11/2018 showed EF 45 to 50%, moderate hypokinesis of the left ventricular basal mid inferior wall.  Serial troponin peaked at 23.54 in the hospital.  Postprocedure patient was placed on aspirin and Brilinta.  Since discharge, patient has been compliant with aspirin and Brilinta.  Her blood pressure has been persistently in the 130s.  Blood pressure today was 134/80 heart rate is 77.  Weight is 183 this morning.  Overall she is doing well on the current medication.  With her blood pressure persistently in the 130s, I will increase her lisinopril to 20 mg daily.  She can have her fasting lipid, liver function test and basic metabolic panel obtained in 3 months by her PCP.  I will forward this note to Dr. Delfina Redwood.  Overall she is doing quite well and appreciated the care she received.  She does have occasional ankle edema she stands on her feet throughout the day, however this resolves by morning after overnight sleep.  She does not need a diuretic at this time.   Past Medical History:  Diagnosis Date  . BPPV (benign paroxysmal positional vertigo), left   . Depression   . Family history of heart disease   . GERD (gastroesophageal reflux disease)   . History of nuclear stress test 05/01/2012   dr berry   normal lexiscan nuclear study w/ no ischemia/  normal LV  function and wall motion , ef 73%  . Hyperlipidemia   . Hypertension   . Hypothyroidism   . OA (osteoarthritis)   . Type 2 diabetes mellitus ([place])   . Wears contact lenses    left eye only  . Wears partial dentures    upper   Past Surgical History:  Procedure Laterality Date  . CATARACT EXTRACTION W/ INTRAOCULAR LENS  IMPLANT, BILATERAL  06/2018  . [place];  1972  . CORONARY/GRAFT ACUTE MI REVASCULARIZATION N/A 12/10/2018   Procedure: CORONARY/GRAFT ACUTE  MI REVASCULARIZATION;  Surgeon: Wellington Hampshire, MD;  Location: [place] [facility];  Service: Cardiovascular;  Laterality: N/A;  . CYSTECTOMY     sebaceous cyst on breast  . LEFT HEART CATH AND CORONARY ANGIOGRAPHY N/A 12/10/2018   Procedure: LEFT HEART CATH AND CORONARY ANGIOGRAPHY;  Surgeon: Wellington Hampshire, MD;  Location: [place] [facility];  Service: Cardiovascular;  Laterality: N/A;  . TOTAL KNEE ARTHROPLASTY Right 06/11/2017   Procedure: RIGHT TOTAL KNEE ARTHROPLASTY;  Surgeon: Paralee Cancel, MD;  Location: [facility];  Service: Orthopedics;  Laterality: Right;  70 mins  . TOTAL KNEE ARTHROPLASTY Left 08/19/2018   Procedure: TOTAL KNEE ARTHROPLASTY;  Surgeon: Paralee Cancel, MD;  Location: [facility];  Service: Orthopedics;  Laterality: Left;     No outpatient medications have been marked as taking for the 01/01/19 encounter (Appointment) with Almyra Deforest, [place].     Allergies:   Lexapro [escitalopram oxalate]; Doxycycline; Erythromycin; Erythromycin base; Hydrocodone; Macrobid [nitrofurantoin]; and Sulfa antibiotics   Social History   Tobacco Use  . Smoking status: Never Smoker  . Smokeless tobacco: Never Used  Substance Use Topics  . Alcohol use: No  . Drug use: Never     Family Hx: The patient's family history includes Breast cancer (age of onset: 37) in her maternal aunt; Colon cancer in her maternal uncle; Colon cancer (age of onset: 32) in her mother; Heart disease in her brother; Heart disease (age of onset: 27) in her father.  ROS:   Please see the history of present illness.     All other systems reviewed and are negative.   Labs/Other Tests and Data Reviewed:    Recent Labs: 12/10/2018: ALT 14 12/11/2018: Hemoglobin 11.9; Platelets 202 12/12/2018: BUN 15; Creatinine, Ser 0.98; Potassium 3.7; Sodium 140   Recent Lipid Panel Lab Results  Component Value Date/Time   CHOL 175 12/10/2018 03:00 PM   TRIG 276 (H) 12/10/2018 03:00 PM   HDL 43 12/10/2018 03:00 PM   CHOLHDL 4.1  12/10/2018 03:00 PM   LDLCALC 77 12/10/2018 03:00 PM    Wt Readings from Last 3 Encounters:  12/12/18 188 lb 4.8 oz (85.4 kg)  08/19/18 188 lb (85.3 kg)  08/14/18 185 lb 6 oz (84.1 kg)    Cath 12/10/2018  Prox RCA to Mid RCA lesion is 100% stenosed.  Prox LAD lesion is 99% stenosed.  Post intervention, there is a 0% residual stenosis.  A drug-eluting stent was successfully placed using a STENT RESOLUTE ONYX 2.5X15.  Ost 1st Diag lesion is 40% stenosed.  Mid LAD lesion is 80% stenosed.  Post intervention, there is a 5% residual stenosis.  A drug-eluting stent was successfully placed using a STENT RESOLUTE ONYX 2.0X18.  Mid Cx lesion is 50% stenosed.  LV end diastolic pressure is mildly elevated.  The left ventricular ejection fraction is 35-45% by visual estimate.  There is moderate left ventricular systolic dysfunction.   1.  Severe two-vessel  coronary artery disease.  The patient presented with inferior ST elevation myocardial infarction with symptoms that started about 2 weeks prior to presentation.  The culprit for her STEMI is likely an acute on chronic occlusion in the RCA.  There is also high-grade stenosis in the proximal LAD and mid LAD.  Moderate disease in the left circumflex. 2.  Moderately reduced LV systolic function with an EF of 35 to 40%.  Mildly elevated left ventricular end-diastolic pressure. 3. Inability to cross the occlusion in the right coronary artery. 4. Successful angioplasty and drug-eluting stent placement to the LAD x2.  Recommendations: Dual antiplatelet therapy for at least 1 year. The patient will likely finish an inferior infarct but she has reasonable left-to-right collaterals. The patient's chest pain improved at the end of the procedure but as expected, did not resolve and thus I elected to start her on nitroglycerin drip.  She also continued to have inferior ST elevation in her EKG and this is also expected.    Echo 12/11/2018 1.  Moderate hypokinesis of the left ventricular, basal-mid inferior wall.  2. The left ventricle has mildly reduced systolic function, with an ejection fraction of 45-50%. The cavity size was normal. There is mildly increased left ventricular wall thickness. Left ventricular diastolic Doppler parameters are consistent with  impaired relaxation.  3. The right ventricle has normal systolic function. The cavity was normal. There is no increase in right ventricular wall thickness.  4. The aortic valve is tricuspid Moderate thickening of the aortic valve Moderate calcification of the aortic valve.  Objective:    Vital Signs:  There were no vitals taken for this visit.  Right arm 134/80 left arm 134/77 heart rate 77.  Weight 183 pounds Well nourished, well developed female in no acute distress.   ASSESSMENT & PLAN:    1.  CAD: Recent inferior STEMI, occluded RCA unable to be crossed with wire.  Distal RCA perfused by left-to-right collateral.  Patient underwent DES x2 to mid and proximal LAD.  Continue aspirin and Brilinta.  2.  Ischemic cardiomyopathy: EF 45 to 50%.  Borderline low.  Patient does have trace amount of ankle edema however this is unlikely related to heart failure but more likely related to venous insufficiency.  Ankle edema resolve after a night of sleep.  3. Hypertension: Blood pressure borderline elevated, 134/80.  Increase lisinopril to 20 mg daily  4. Hyperlipidemia: Continue high-dose statin.  Obtain fasting lipid panel and LFT by primary care provider in 21-month.  If it is not obtained by cardiology follow-up, we will repeat in our office.  5. DM 2: Managed by primary care provider   COVID-19 Education: The signs and symptoms of COVID-19 were discussed with the patient and how to seek care for testing (follow up with PCP or arrange E-visit).  The importance of social distancing was discussed today.  Patient Risk:   After full review of this patient's clinical status, I feel  that they are at least moderate risk at this time.  Time:   Today, I have spent 50 minutes with the patient with telehealth technology discussing post PCI care, symptom of angina, medical therapy, blood pressure control.     Medication Adjustments/Labs and Tests Ordered: Current medicines are reviewed at length with the patient today.  Concerns regarding medicines are outlined above.  Tests Ordered: No orders of the defined types were placed in this encounter.  Medication Changes: No orders of the defined types were placed in this encounter.  Disposition:  Follow up in 3 month(s)  Signed, Almyra Deforest, [place]  01/01/2019 8:39 AM    [facility]

## 2019-01-01 NOTE — Telephone Encounter (Signed)
Virtual Visit Pre-Appointment Phone Call  Steps For Call:  1. Confirm consent - "In the setting of the current Covid19 crisis, you are scheduled for a (phone or video) visit with your provider on (date) at (time).  Just as we do with many in-office visits, in order for you to participate in this visit, we must obtain consent.  If you'd like, I can send this to your mychart (if signed up) or email for you to review.  Otherwise, I can obtain your verbal consent now.  All virtual visits are billed to your insurance company just like a normal visit would be.  By agreeing to a virtual visit, we'd like you to understand that the technology does not allow for your provider to perform an examination, and thus may limit your provider's ability to fully assess your condition.  Finally, though the technology is pretty good, we cannot assure that it will always work on either your or our end, and in the setting of a video visit, we may have to convert it to a phone-only visit.  In either situation, we cannot ensure that we have a secure connection.  Are you willing to proceed?"  2. Give patient instructions for WebEx download to smartphone as below if video visit  3. Advise patient to be prepared with any vital sign or heart rhythm information, their current medicines, and a piece of paper and pen handy for any instructions they may receive the day of their visit  4. Inform patient they will receive a phone call 15 minutes prior to their appointment time (may be from unknown caller ID) so they should be prepared to answer  5. Confirm that appointment type is correct in Epic appointment notes (video vs telephone)    TELEPHONE CALL NOTE  Desiree Rasmussen has been deemed a candidate for a follow-up tele-health visit to limit community exposure during the Covid-19 pandemic. I spoke with the patient via phone to ensure availability of phone/video source, confirm preferred email & phone number, and discuss  instructions and expectations.  I reminded Desiree Rasmussen to be prepared with any vital sign and/or heart rhythm information that could potentially be obtained via home monitoring, at the time of her visit. I reminded Desiree Rasmussen to expect a phone call at the time of her visit if her visit.  Did the patient verbally acknowledge consent to treatment? Yes  [name], [facility] 01/01/2019 9:32 AM   DOWNLOADING THE [place]  - If Apple, go to [employer] and type in WebEx in the search bar. [place] [employer], the blue/green circle. The app is free but as with any other app downloads, their phone may require them to verify saved payment information or Apple password. The patient does NOT have to create an account.  - If Android, ask patient to go to [employer] and type in WebEx in the search bar. [place] [employer], the blue/green circle. The app is free but as with any other app downloads, their phone may require them to verify saved payment information or Android password. The patient does NOT have to create an account.   CONSENT FOR TELE-HEALTH VISIT - PLEASE REVIEW  I hereby voluntarily request, consent and authorize [facility] and its employed or contracted physicians, physician assistants, nurse practitioners or other licensed health care professionals (the Practitioner), to provide me with telemedicine health care services (the "Services") as deemed necessary by the treating Practitioner. I  acknowledge and consent to receive the Services by the Practitioner via telemedicine. I understand that the telemedicine visit will involve communicating with the Practitioner through live audiovisual communication technology and the disclosure of certain medical information by electronic transmission. I acknowledge that I have been given the opportunity to request an in-person assessment or other available alternative prior to the telemedicine visit and  am voluntarily participating in the telemedicine visit.  I understand that I have the right to withhold or withdraw my consent to the use of telemedicine in the course of my care at any time, without affecting my right to future care or treatment, and that the Practitioner or I may terminate the telemedicine visit at any time. I understand that I have the right to inspect all information obtained and/or recorded in the course of the telemedicine visit and may receive copies of available information for a reasonable fee.  I understand that some of the potential risks of receiving the Services via telemedicine include:  Marland Kitchen Delay or interruption in medical evaluation due to technological equipment failure or disruption; . Information transmitted may not be sufficient (e.g. poor resolution of images) to allow for appropriate medical decision making by the Practitioner; and/or  . In rare instances, security protocols could fail, causing a breach of personal health information.  Furthermore, I acknowledge that it is my responsibility to provide information about my medical history, conditions and care that is complete and accurate to the best of my ability. I acknowledge that Practitioner's advice, recommendations, and/or decision may be based on factors not within their control, such as incomplete or inaccurate data provided by me or distortions of diagnostic images or specimens that may result from electronic transmissions. I understand that the practice of medicine is not an exact science and that Practitioner makes no warranties or guarantees regarding treatment outcomes. I acknowledge that I will receive a copy of this consent concurrently upon execution via email to the email address I last provided but may also request a printed copy by calling the office of [place].    I understand that my insurance will be billed for this visit.   I have read or had this consent read to me. . I understand the  contents of this consent, which adequately explains the benefits and risks of the Services being provided via telemedicine.  . I have been provided ample opportunity to ask questions regarding this consent and the Services and have had my questions answered to my satisfaction. . I give my informed consent for the services to be provided through the use of telemedicine in my medical care  By participating in this telemedicine visit I agree to the above.

## 2019-01-01 NOTE — Patient Instructions (Addendum)
Medication Instructions:  INCREASE Lisinopril to 20 Mg daily  If you need a refill on your cardiac medications before your next appointment, please call your pharmacy.   Lab work: NONE  If you have labs (blood work) drawn today and your tests are completely normal, you will receive your results only by: Marland Kitchen MyChart Message (if you have MyChart) OR . A paper copy in the mail If you have any lab test that is abnormal or we need to change your treatment, we will call you to review the results.  Testing/Procedures: NONE  Follow-Up: At [facility], you and your health needs are our priority.  As part of our continuing mission to provide you with exceptional heart care, we have created designated Provider Care Teams.  These Care Teams include your primary Cardiologist (physician) and Advanced Practice Providers (APPs -  Physician Assistants and Nurse Practitioners) who all work together to provide you with the care you need, when you need it. You will need a follow up appointment in 3-4 months.  Please call our office 2 months in advance to schedule this appointment.  You may see Quay Burow, MD or one of the following Advanced Practice Providers on your designated Care Team:   Kerin Ransom, PA-C Roby Lofts, [place] . Sande Rives, PA-C  Any Other Special Instructions Will Be Listed Below (If Applicable).

## 2019-01-19 ENCOUNTER — Encounter: Payer: Self-pay | Admitting: Cardiology

## 2019-01-19 DIAGNOSIS — R110 Nausea: Secondary | ICD-10-CM | POA: Diagnosis not present

## 2019-01-19 DIAGNOSIS — R600 Localized edema: Secondary | ICD-10-CM | POA: Diagnosis not present

## 2019-01-19 DIAGNOSIS — E1122 Type 2 diabetes mellitus with diabetic chronic kidney disease: Secondary | ICD-10-CM | POA: Diagnosis not present

## 2019-01-19 DIAGNOSIS — N183 Chronic kidney disease, stage 3 (moderate): Secondary | ICD-10-CM | POA: Diagnosis not present

## 2019-01-19 DIAGNOSIS — I2510 Atherosclerotic heart disease of native coronary artery without angina pectoris: Secondary | ICD-10-CM | POA: Diagnosis not present

## 2019-01-19 NOTE — Telephone Encounter (Signed)
Pt had telemedicine visit with Almyra Deforest, PA-C on 4/2

## 2019-01-21 ENCOUNTER — Telehealth (HOSPITAL_COMMUNITY): Payer: Self-pay | Admitting: *Deleted

## 2019-01-21 NOTE — Telephone Encounter (Signed)
Pt called and spoke to pt regarding Cardiac Rehab continued closure due to adherence of national recommendation for Covid -19 in group setting. Pt verbalized understanding. Pt has limited activity due to stay at home restrictions and pollen. Will send pt educational handouts on exercise -warm up, cool down and bands to use.  Pt feels she has a good understanding of heart healthy eating. Cherre Huger, BSN Cardiac and Training and development officer

## 2019-02-05 ENCOUNTER — Telehealth: Payer: Self-pay | Admitting: Cardiovascular Disease

## 2019-02-05 NOTE — Telephone Encounter (Signed)
Returned call to patient she stated she has been nauseated ever since she started taking the new medications listed below.Stated she is nauseated all day long.She thought it would get better,but has not.Advised I will send message to our pharmacist for advice.

## 2019-02-05 NOTE — Telephone Encounter (Signed)
Returned call to patient no answer.Left message on personal voice mail Raquel's advice.Advised to call back if nausea continues.

## 2019-02-05 NOTE — Telephone Encounter (Signed)
Any oral medication has the potential to irritate your stomach, but none of this medications are know to having nausea as a main side effect.  *Please make sure all medication is taken 30-60 minutes after eating a full meal. Use at least 6-8onz of fluid to take the pills.

## 2019-02-05 NOTE — Telephone Encounter (Signed)
New Message   Pt c/o medication issue:  1. Name of Medication: Carvedilol, Lisinopril, Atorvastatin, and Brilinta  2. How are you currently taking this medication (dosage and times per day)? Carvedilol 6.25mg  1 tablet twice a day, Lisinopril 20mg  1 tablet a day, Brilinta 90mg  1 tablet twice dailey, and Atorvastatin 80mg  1 tablet dailey.  3. Are you having a reaction (difficulty breathing--STAT)?   4. What is your medication issue? Patient just started taking these new medications, and states she's be nauseous since starting the meds and wants to know if any of them has a side effect of nausea.

## 2019-02-16 DIAGNOSIS — D225 Melanocytic nevi of trunk: Secondary | ICD-10-CM | POA: Diagnosis not present

## 2019-02-16 DIAGNOSIS — L570 Actinic keratosis: Secondary | ICD-10-CM | POA: Diagnosis not present

## 2019-02-16 DIAGNOSIS — L720 Epidermal cyst: Secondary | ICD-10-CM | POA: Diagnosis not present

## 2019-02-16 DIAGNOSIS — L821 Other seborrheic keratosis: Secondary | ICD-10-CM | POA: Diagnosis not present

## 2019-02-16 DIAGNOSIS — L309 Dermatitis, unspecified: Secondary | ICD-10-CM | POA: Diagnosis not present

## 2019-02-16 DIAGNOSIS — D692 Other nonthrombocytopenic purpura: Secondary | ICD-10-CM | POA: Diagnosis not present

## 2019-03-10 ENCOUNTER — Other Ambulatory Visit: Payer: Self-pay | Admitting: Internal Medicine

## 2019-03-10 DIAGNOSIS — R110 Nausea: Secondary | ICD-10-CM

## 2019-03-10 DIAGNOSIS — R109 Unspecified abdominal pain: Secondary | ICD-10-CM | POA: Diagnosis not present

## 2019-03-10 DIAGNOSIS — R748 Abnormal levels of other serum enzymes: Secondary | ICD-10-CM | POA: Diagnosis not present

## 2019-03-10 DIAGNOSIS — R1084 Generalized abdominal pain: Secondary | ICD-10-CM

## 2019-03-11 DIAGNOSIS — K824 Cholesterolosis of gallbladder: Secondary | ICD-10-CM | POA: Diagnosis not present

## 2019-03-16 ENCOUNTER — Telehealth: Payer: Self-pay | Admitting: *Deleted

## 2019-03-16 NOTE — Telephone Encounter (Signed)
Will forward to Dr Gwenlyn Found so he will be aware    Message from Philbert Riser sent at 03/16/2019 12:31 PM EDT -----  Mrs. Desiree Rasmussen ([id number]), said her Lipitor was increased to 80 mg after discharged from the hospital in April. Now the top of her stomach is hurting with some nausea.She cut her dose to 40 mg in May.Desiree Rasmussen went to her primary care doctor with the above problems and they schedule her an abd.u/s. She waiting on her result and wanted Dr.Berry to be aware. Thanks

## 2019-03-17 ENCOUNTER — Telehealth (HOSPITAL_COMMUNITY): Payer: Self-pay

## 2019-03-17 DIAGNOSIS — R3915 Urgency of urination: Secondary | ICD-10-CM | POA: Diagnosis not present

## 2019-03-17 NOTE — Telephone Encounter (Signed)
Called and spoke with pt in regards to our Virtual Cardiac Rehab program, pt stated she does not have a smart phone or any other means to download the app. But she is still interested in Cardiac Rehab and will like to be contact once we reopen.

## 2019-03-20 ENCOUNTER — Other Ambulatory Visit: Payer: Medicare Other

## 2019-03-20 DIAGNOSIS — K760 Fatty (change of) liver, not elsewhere classified: Secondary | ICD-10-CM | POA: Diagnosis not present

## 2019-03-20 DIAGNOSIS — R110 Nausea: Secondary | ICD-10-CM | POA: Diagnosis not present

## 2019-03-20 DIAGNOSIS — N390 Urinary tract infection, site not specified: Secondary | ICD-10-CM | POA: Diagnosis not present

## 2019-03-20 DIAGNOSIS — R740 Nonspecific elevation of levels of transaminase and lactic acid dehydrogenase [LDH]: Secondary | ICD-10-CM | POA: Diagnosis not present

## 2019-03-26 ENCOUNTER — Telehealth (HOSPITAL_COMMUNITY): Payer: Self-pay

## 2019-03-26 NOTE — Telephone Encounter (Signed)
Pt insurance is active and benefits verified through Medicare A/B. Co-pay $0.00, DED $198.00/$198.00 met, out of pocket $0.00/$0.00 met, co-insurance 20%. No pre-authorization required. [name], 03/26/2019 @ 12:16PM, REF# [id number]  2ndary insurance is active and benefits verified through [employer]. Co-pay $0.00, DED $0.00/$0.00 met, out of pocket $0.00/$0.00 met, co-insurance 0%. No pre-authorization required. [name], 03/26/2019 @ 12:19PM, REF# [id number]

## 2019-03-27 DIAGNOSIS — R350 Frequency of micturition: Secondary | ICD-10-CM | POA: Diagnosis not present

## 2019-03-27 DIAGNOSIS — R7989 Other specified abnormal findings of blood chemistry: Secondary | ICD-10-CM | POA: Diagnosis not present

## 2019-04-09 ENCOUNTER — Telehealth (HOSPITAL_COMMUNITY): Payer: Self-pay

## 2019-04-30 DIAGNOSIS — N183 Chronic kidney disease, stage 3 (moderate): Secondary | ICD-10-CM | POA: Diagnosis not present

## 2019-04-30 DIAGNOSIS — E1122 Type 2 diabetes mellitus with diabetic chronic kidney disease: Secondary | ICD-10-CM | POA: Diagnosis not present

## 2019-04-30 DIAGNOSIS — E039 Hypothyroidism, unspecified: Secondary | ICD-10-CM | POA: Diagnosis not present

## 2019-04-30 DIAGNOSIS — F419 Anxiety disorder, unspecified: Secondary | ICD-10-CM | POA: Diagnosis not present

## 2019-04-30 DIAGNOSIS — I10 Essential (primary) hypertension: Secondary | ICD-10-CM | POA: Diagnosis not present

## 2019-04-30 DIAGNOSIS — I2510 Atherosclerotic heart disease of native coronary artery without angina pectoris: Secondary | ICD-10-CM | POA: Diagnosis not present

## 2019-04-30 DIAGNOSIS — E7800 Pure hypercholesterolemia, unspecified: Secondary | ICD-10-CM | POA: Diagnosis not present

## 2019-04-30 DIAGNOSIS — K760 Fatty (change of) liver, not elsewhere classified: Secondary | ICD-10-CM | POA: Diagnosis not present

## 2019-05-04 DIAGNOSIS — E1122 Type 2 diabetes mellitus with diabetic chronic kidney disease: Secondary | ICD-10-CM | POA: Diagnosis not present

## 2019-05-04 DIAGNOSIS — I2510 Atherosclerotic heart disease of native coronary artery without angina pectoris: Secondary | ICD-10-CM | POA: Diagnosis not present

## 2019-05-04 DIAGNOSIS — E039 Hypothyroidism, unspecified: Secondary | ICD-10-CM | POA: Diagnosis not present

## 2019-05-04 DIAGNOSIS — N183 Chronic kidney disease, stage 3 (moderate): Secondary | ICD-10-CM | POA: Diagnosis not present

## 2019-05-04 DIAGNOSIS — I10 Essential (primary) hypertension: Secondary | ICD-10-CM | POA: Diagnosis not present

## 2019-05-04 DIAGNOSIS — E7800 Pure hypercholesterolemia, unspecified: Secondary | ICD-10-CM | POA: Diagnosis not present

## 2019-05-18 DIAGNOSIS — E039 Hypothyroidism, unspecified: Secondary | ICD-10-CM | POA: Diagnosis not present

## 2019-06-02 ENCOUNTER — Other Ambulatory Visit: Payer: Self-pay | Admitting: Internal Medicine

## 2019-06-02 ENCOUNTER — Ambulatory Visit
Admission: RE | Admit: 2019-06-02 | Discharge: 2019-06-02 | Disposition: A | Discharge status: Home / Self Care | Payer: Medicare Other | Source: Ambulatory Visit | Attending: Internal Medicine | Admitting: Internal Medicine

## 2019-06-02 DIAGNOSIS — M25541 Pain in joints of right hand: Secondary | ICD-10-CM | POA: Diagnosis not present

## 2019-06-02 DIAGNOSIS — M19041 Primary osteoarthritis, right hand: Secondary | ICD-10-CM | POA: Diagnosis not present

## 2019-06-02 DIAGNOSIS — M25542 Pain in joints of left hand: Secondary | ICD-10-CM | POA: Diagnosis not present

## 2019-06-02 DIAGNOSIS — M19042 Primary osteoarthritis, left hand: Secondary | ICD-10-CM | POA: Diagnosis not present

## 2019-06-12 ENCOUNTER — Other Ambulatory Visit: Payer: Self-pay | Admitting: Physician Assistant

## 2019-06-17 ENCOUNTER — Ambulatory Visit (INDEPENDENT_AMBULATORY_CARE_PROVIDER_SITE_OTHER): Payer: Medicare Other | Admitting: Cardiovascular Disease

## 2019-06-17 ENCOUNTER — Encounter: Payer: Self-pay | Admitting: Cardiovascular Disease

## 2019-06-17 ENCOUNTER — Other Ambulatory Visit: Payer: Self-pay

## 2019-06-17 DIAGNOSIS — E782 Mixed hyperlipidemia: Secondary | ICD-10-CM | POA: Diagnosis not present

## 2019-06-17 DIAGNOSIS — I2119 ST elevation (STEMI) myocardial infarction involving other coronary artery of inferior wall: Secondary | ICD-10-CM | POA: Diagnosis not present

## 2019-06-17 DIAGNOSIS — I255 Ischemic cardiomyopathy: Secondary | ICD-10-CM

## 2019-06-17 DIAGNOSIS — I10 Essential (primary) hypertension: Secondary | ICD-10-CM | POA: Diagnosis not present

## 2019-06-17 NOTE — Assessment & Plan Note (Addendum)
History of essential hypertension with blood pressure measured today at 172/92.  Repeat blood pressure at the end of the visit was 134/74.  She is on carvedilol and lisinopril at home.  She does check her blood pressure at home is usually in the 120/80 range.  I am going to have her keep a blood pressure log for 30 days and Cyril Mourning will call to review and make any appropriate changes.

## 2019-06-17 NOTE — Assessment & Plan Note (Signed)
History of CAD status post inferior STEMI 12/10/2018 with catheterization performed by Dr. Velva Harman revealing an occluded RCA that appeared to be a CTO and high-grade mid and distal LAD stenoses which were stented with drug-eluting stents.  She had a 50% AV groove circumflex stenosis which was treated medically.  Her ejection fraction at the time of cath was 35 to 45% with inferior hypokinesia.  2D echo revealed her EF to be 45 to 50%.  She is on aspirin and Brilinta and has had no recurrent symptoms.  She had left-to-right collaterals.

## 2019-06-17 NOTE — Assessment & Plan Note (Signed)
History of hyperlipidemia on high-dose statin therapy with lipid profile performed 05/04/2019 revealing total cholesterol 147, LDL 72 and HDL 42.

## 2019-06-17 NOTE — Progress Notes (Signed)
06/17/2019 Desiree Rasmussen   March 13, 1941  [id number]  Primary Physician Seward Carol, MD Primary Cardiologist: Lorretta Harp MD [name], [place]  HPI:  Desiree Rasmussen is a 78 y.o.   moderately overweight, married Caucasian female, mother of two, grandmother to two grandchildren, whose husband , Desiree Rasmussen, unfortunately passed away on 11/20/2013. He was a patient of mine as well. He apparently died of complications of pneumonia. I last saw her 12 months ago. I last saw her in the office  08/01/2018.Marland Kitchen She has a history of hypertension and hyperlipidemia, as well as a family history of heart disease. She had chest pain and ruled out myocardial infarction on an ER visit earlier last year, and when I saw her she was complaining of atypical chest pain. A Myoview stress test performed in our office on May 01, 2012, was entirely normal. I thought her pain was more musculoskeletal and/or radicular and her symptoms subsequently resolved. Since I saw her a year ago she's remained fairly stable and is asymptomatic.She apparently needs a right total knee replacement by Dr. Olin9/11/18.  Since I saw her in November 2019 she did present with an inferior STEMI 12/10/2018 and had urgent cath by Dr. Fletcher Anon via the right radial approach revealing an occluded RCA which was deemed to be an CTO with left-to-right collaterals, high-grade mid and distal LAD stenoses which were both stented with drug-eluting stents and a 50% mid AV groove circumflex stenosis.  Her EF at the time of cath was 35 to 45% and subsequent echo 45 to 50%.  She remains on aspirin and Brilinta.  She is had no recurrent symptoms.   Current Meds  Medication Sig  . acetaminophen (TYLENOL) 500 MG tablet Take 500-1,000 mg by mouth every 8 (eight) hours as needed for mild pain, moderate pain or headache.  Marland Kitchen aspirin EC 81 MG tablet Take 81 mg by mouth daily.  Marland Kitchen atorvastatin (LIPITOR) 80 MG tablet Take 1 tablet (80 mg total) by mouth  daily at 6 PM.  . carvedilol (COREG) 6.25 MG tablet Take 1 tablet (6.25 mg total) by mouth 2 (two) times daily with a meal.  . clorazepate (TRANXENE) 7.5 MG tablet Take 7.5 mg by mouth See admin instructions. Take 7.5 mg by mouth at bedtime and an additional 7.5 mg once a day as needed for anxiousness  . fluticasone (FLONASE) 50 MCG/ACT nasal spray Place 2 sprays into both nostrils daily as needed for allergies or rhinitis.  Marland Kitchen lisinopril (ZESTRIL) 20 MG tablet TAKE 1 TABLET BY MOUTH EVERY DAY  . liver oil-zinc oxide (DESITIN) 40 % ointment Apply 1 application topically as needed for irritation (UNDER THE BREASTS AND BETWEEN SKIN FOLDS).   Marland Kitchen loratadine (CLARITIN) 10 MG tablet Take 10 mg by mouth daily as needed for allergies or rhinitis.  . Multiple Vitamins-Minerals (MULTIVITAMIN WITH MINERALS) tablet Take 1 tablet by mouth 3 (three) times a week.   . nitroGLYCERIN (NITROSTAT) 0.4 MG SL tablet Place 1 tablet (0.4 mg total) under the tongue every 5 (five) minutes as needed for chest pain.  Marland Kitchen SYNTHROID 100 MCG tablet Take 100 mcg by mouth daily before breakfast.   . ticagrelor (BRILINTA) 90 MG TABS tablet Take 1 tablet (90 mg total) by mouth 2 (two) times daily.  . vitamin C (ASCORBIC ACID) 500 MG tablet Take 500-1,000 mg by mouth daily.  . [DISCONTINUED] Carboxymethylcell-Glycerin PF (REFRESH OPTIVE PF) 0.5-0.9 % SOLN Place 1-2 drops into both eyes 3 (three)  times daily as needed (for irritation).  . [DISCONTINUED] docusate sodium (COLACE) 100 MG capsule Take 1 capsule (100 mg total) by mouth 2 (two) times daily. (Patient taking differently: Take 100 mg by mouth daily as needed for mild constipation or moderate constipation. )  . [DISCONTINUED] metFORMIN (GLUCOPHAGE-XR) 500 MG 24 hr tablet Take 500 mg by mouth daily.  . [DISCONTINUED] polyethylene glycol (MIRALAX / GLYCOLAX) packet Take 17 g by mouth 2 (two) times daily.     Allergies  Allergen Reactions  . Lexapro [Escitalopram Oxalate] Diarrhea   . Doxycycline Nausea And Vomiting    Upset stomach, also  . Erythromycin Other (See Comments)    "upset stomach"  . Erythromycin Base Nausea Only  . Hydrocodone Other (See Comments)    Affected mood negatively 500 Mg   . Macrobid [Nitrofurantoin] Other (See Comments)    Didn't work  . Sulfa Antibiotics Rash    Social History   Socioeconomic History  . Marital status: Widowed    Spouse name: Not on file  . Number of children: 2  . Years of education: Not on file  . Highest education level: Not on file  Occupational History  . Not on file  Social Needs  . Financial resource strain: Not on file  . Food insecurity    Worry: Not on file    Inability: Not on file  . Transportation needs    Medical: Not on file    Non-medical: Not on file  Tobacco Use  . Smoking status: Never Smoker  . Smokeless tobacco: Never Used  Substance and Sexual Activity  . Alcohol use: No  . Drug use: Never  . Sexual activity: Not on file  Lifestyle  . Physical activity    Days per week: Not on file    Minutes per session: Not on file  . Stress: Not on file  Relationships  . Social Herbalist on phone: Not on file    Gets together: Not on file    Attends religious service: Not on file    Active member of club or organization: Not on file    Attends meetings of clubs or organizations: Not on file    Relationship status: Not on file  . Intimate partner violence    Fear of current or ex partner: Not on file    Emotionally abused: Not on file    Physically abused: Not on file    Forced sexual activity: Not on file  Other Topics Concern  . Not on file  Social History Narrative  . Not on file     Review of Systems: General: negative for chills, fever, night sweats or weight changes.  Cardiovascular: negative for chest pain, dyspnea on exertion, edema, orthopnea, palpitations, paroxysmal nocturnal dyspnea or shortness of breath Dermatological: negative for rash Respiratory:  negative for cough or wheezing Urologic: negative for hematuria Abdominal: negative for nausea, vomiting, diarrhea, bright red blood per rectum, melena, or hematemesis Neurologic: negative for visual changes, syncope, or dizziness All other systems reviewed and are otherwise negative except as noted above.    Blood pressure 134/74, pulse 80, temperature (!) 96.8 F (36 C), height 5\' 4"  (1.626 m), weight 181 lb (82.1 kg), SpO2 97 %.  General appearance: alert and no distress Neck: no adenopathy, no carotid bruit, no JVD, supple, symmetrical, trachea midline and thyroid not enlarged, symmetric, no tenderness/mass/nodules Lungs: clear to auscultation bilaterally Heart: regular rate and rhythm, S1, S2 normal, no murmur, click, rub  or gallop Extremities: extremities normal, atraumatic, no cyanosis or edema Pulses: 2+ and symmetric Skin: Skin color, texture, turgor normal. No rashes or lesions Neurologic: Alert and oriented X 3, normal strength and tone. Normal symmetric reflexes. Normal coordination and gait  EKG normal sinus rhythm at 74 with small inferior Q waves and nonspecific ST and T wave changes.  Personally reviewed this EKG.  ASSESSMENT AND PLAN:   Essential hypertension History of essential hypertension with blood pressure measured today at 172/92.  Repeat blood pressure at the end of the visit was 134/74.  She is on carvedilol and lisinopril at home.  She does check her blood pressure at home is usually in the 120/80 range.  I am going to have her keep a blood pressure log for 30 days and Cyril Mourning will call to review and make any appropriate changes.  Hyperlipidemia History of hyperlipidemia on high-dose statin therapy with lipid profile performed 05/04/2019 revealing total cholesterol 147, LDL 72 and HDL 42.  Acute ST elevation myocardial infarction (STEMI) of inferior wall (HCC) History of CAD status post inferior STEMI 12/10/2018 with catheterization performed by Dr. Velva Harman  revealing an occluded RCA that appeared to be a CTO and high-grade mid and distal LAD stenoses which were stented with drug-eluting stents.  She had a 50% AV groove circumflex stenosis which was treated medically.  Her ejection fraction at the time of cath was 35 to 45% with inferior hypokinesia.  2D echo revealed her EF to be 45 to 50%.  She is on aspirin and Brilinta and has had no recurrent symptoms.  She had left-to-right collaterals.      Lorretta Harp MD FACP,FACC,FAHA, [facility] 06/17/2019 9:50 AM

## 2019-06-17 NOTE — Patient Instructions (Signed)
Medication Instructions:  Your physician recommends that you continue on your current medications as directed. Please refer to the Current Medication list given to you today.  If you need a refill on your cardiac medications before your next appointment, please call your pharmacy.   Lab work: NONE If you have labs (blood work) drawn today and your tests are completely normal, you will receive your results only by: Marland Kitchen MyChart Message (if you have MyChart) OR . A paper copy in the mail If you have any lab test that is abnormal or we need to change your treatment, we will call you to review the results.  Testing/Procedures: NONE  Follow-Up: At [facility], you and your health needs are our priority.  As part of our continuing mission to provide you with exceptional heart care, we have created designated Provider Care Teams.  These Care Teams include your primary Cardiologist (physician) and Advanced Practice Providers (APPs -  Physician Assistants and Nurse Practitioners) who all work together to provide you with the care you need, when you need it. . You will need a follow up appointment in 6 months with Almyra Deforest, PA-C and in 12 months with Dr. Quay Burow.  Please call our office 2 months in advance to schedule each appointment.     Any Other Special Instructions Will Be Listed Below (If Applicable). KEEP A BLOOD PRESSURE LOG FOR 30 DAYS THEN FOLLOW UP WITH A CLINICAL PHARMACIST IN THE HYPERTENSION CLINIC. YOU WILL NEED AN APPOINTMENT.

## 2019-06-24 DIAGNOSIS — M79673 Pain in unspecified foot: Secondary | ICD-10-CM | POA: Diagnosis not present

## 2019-06-24 DIAGNOSIS — M25511 Pain in right shoulder: Secondary | ICD-10-CM | POA: Diagnosis not present

## 2019-06-24 DIAGNOSIS — M79642 Pain in left hand: Secondary | ICD-10-CM | POA: Diagnosis not present

## 2019-06-24 DIAGNOSIS — M25532 Pain in left wrist: Secondary | ICD-10-CM | POA: Diagnosis not present

## 2019-06-24 DIAGNOSIS — M25531 Pain in right wrist: Secondary | ICD-10-CM | POA: Diagnosis not present

## 2019-06-24 DIAGNOSIS — M79641 Pain in right hand: Secondary | ICD-10-CM | POA: Diagnosis not present

## 2019-06-24 DIAGNOSIS — M25512 Pain in left shoulder: Secondary | ICD-10-CM | POA: Diagnosis not present

## 2019-07-13 DIAGNOSIS — Z471 Aftercare following joint replacement surgery: Secondary | ICD-10-CM | POA: Diagnosis not present

## 2019-07-13 DIAGNOSIS — Z96653 Presence of artificial knee joint, bilateral: Secondary | ICD-10-CM | POA: Diagnosis not present

## 2019-07-13 DIAGNOSIS — G5603 Carpal tunnel syndrome, bilateral upper limbs: Secondary | ICD-10-CM | POA: Diagnosis not present

## 2019-07-17 ENCOUNTER — Ambulatory Visit (INDEPENDENT_AMBULATORY_CARE_PROVIDER_SITE_OTHER): Payer: Medicare Other | Admitting: Pharmacist Clinician (PhC)/ Clinical Pharmacy Specialist

## 2019-07-17 ENCOUNTER — Other Ambulatory Visit: Payer: Self-pay

## 2019-07-17 VITALS — BP 128/80

## 2019-07-17 DIAGNOSIS — I255 Ischemic cardiomyopathy: Secondary | ICD-10-CM | POA: Diagnosis not present

## 2019-07-17 DIAGNOSIS — I10 Essential (primary) hypertension: Secondary | ICD-10-CM | POA: Diagnosis not present

## 2019-07-17 DIAGNOSIS — Z23 Encounter for immunization: Secondary | ICD-10-CM | POA: Diagnosis not present

## 2019-07-17 NOTE — Progress Notes (Signed)
     07/17/2019 Desiree Rasmussen [birth year] [id number]  Patient is a 78 year old female who present today with HTN is here today for medication management. Patient has a PMH seen below. After STEMI, PA increased her lisinopril to 20 mg and atorvastatin to 80mg  daily. Patient called in June to report that atorvastatin was causing her to have nausea and hurting stomach and decided on her own to cut the dose in half. She now takes 40 mg atorvastatin. Last office BP 134/74 (06/17/19). Base on patient labs on 12/12/18, Scr 0.98, CrCl 62 ml/min, K 3.7.  Patient is present today reporting pain in both hand due to arthritis and occasional heart flutter but is otherwise fine. Diet has room to improve, and patient admits to that. Her primary doctor told her to take furosemide 20 mg every other day for swelling. Patient office BP today was 128/80.  PHM HLD Atorvastatin 40 mg, LDL 72, TC 147, HDL 42 (labs from August 2020)  STEMI if inferior wall 12/10/18, Catherization performed, present with occluded RCA, Distal LAD stenoses which were stents with drug eluting stents    Cardiac Medication:   carvedilol 6.25 mg BID with meals, aspirin 81 mg, lisinopril 20 mg QD, ticagrelor 90 MG   Blood pressure log: Avg BP Mornings Systolic 123XX123, Avg Evenings Systolic 123XX123. All Diastolic within normal limits   FH: Mom: die from cancer  Age 66, Dad: die from MI Age 39; Brother: HTN, Daughter: HTN,  Son: living, MI in Sept 2019, HTN, Age 71   SH: Does not smoke or drink alcohol, drinks a small bottle of coke soda once in a while Exercise: Walking around the home, 1 hour every 2 wks when she goes to the grocery store, and when grandchildren are there  Diet:  Half of PB&J, hamburger, homecooked soup with veggies, meatloaf, greens with potatoes, roast chicken, pork chop   Snacks: short bread cookies with chocolate, M&M's     Intolerance - Doxycycline, Lexapro, Hydrocodone.    A/P Patient BP today 128/80 was in goal. She  was told to continue to take her BP medication, check BP at least 2-3, to record when she notice any signs of her heart quivering or beating faster and call if it becomes persistent.   Marlowe Aschoff [facility] PharmD Candidate, 2021  I was with student and patient for entire visit and agree with above plan.  Tommy Medal PharmD CPP [facility] [address] [place], [place] [address] [phone]

## 2019-07-17 NOTE — Patient Instructions (Signed)
  Your blood pressure today is 126/80  Check your blood pressure at home 3-4 times per week and keep record of the readings.  Take your BP meds as follows:  Continue with all current medications  Bring all of your meds, your BP cuff and your record of home blood pressures to your next appointment.  Exercise as you're able, try to walk approximately 30 minutes per day.  Keep salt intake to a minimum, especially watch canned and prepared boxed foods.  Eat more fresh fruits and vegetables and fewer canned items.  Avoid eating in fast food restaurants.    HOW TO TAKE YOUR BLOOD PRESSURE: . Rest 5 minutes before taking your blood pressure. .  Don't smoke or drink caffeinated beverages for at least 30 minutes before. . Take your blood pressure before (not after) you eat. . Sit comfortably with your back supported and both feet on the floor (don't cross your legs). . Elevate your arm to heart level on a table or a desk. . Use the proper sized cuff. It should fit smoothly and snugly around your bare upper arm. There should be enough room to slip a fingertip under the cuff. The bottom edge of the cuff should be 1 inch above the crease of the elbow. . Ideally, take 3 measurements at one sitting and record the average.

## 2019-07-21 DIAGNOSIS — M79641 Pain in right hand: Secondary | ICD-10-CM | POA: Diagnosis not present

## 2019-07-21 DIAGNOSIS — M79642 Pain in left hand: Secondary | ICD-10-CM | POA: Diagnosis not present

## 2019-07-21 DIAGNOSIS — M25532 Pain in left wrist: Secondary | ICD-10-CM | POA: Diagnosis not present

## 2019-07-21 DIAGNOSIS — M25531 Pain in right wrist: Secondary | ICD-10-CM | POA: Diagnosis not present

## 2019-07-21 DIAGNOSIS — G5603 Carpal tunnel syndrome, bilateral upper limbs: Secondary | ICD-10-CM | POA: Diagnosis not present

## 2019-07-22 ENCOUNTER — Telehealth: Payer: Self-pay | Admitting: *Deleted

## 2019-07-22 NOTE — Telephone Encounter (Signed)
Dr. Gwenlyn Found   Please comment on this patients Brilinta therapy. She is scheduled for carpal tunnel release on 08/13/2019 however she has a complex cardiac hx s/p inferior STEMI 12/10/2018 with cath revealing an occluded RCA that appeared to be a CTO and high-grade mid and distal LAD stenoses which were stented with drug-eluting stents. She had a 50% AV groove circumflex stenosis which was treated medically. Her ejection fraction at the time of cath was 35 to 45% with inferior hypokinesia. 2D echo revealed her EF to be 45 to 50%. She is on aspirin and Brilinta and has had no recurrent symptoms at last follow up visit with you 06/17/2019.  Please send your recommendations to the pre-op pool   Thank you Sharee Pimple

## 2019-07-22 NOTE — Telephone Encounter (Signed)
   [facility] Pre-operative Risk Assessment    Request for surgical clearance:  1. What type of surgery is being performed? Carpel tunnel release  2. When is this surgery scheduled? 08/13/2019   3. What type of clearance is required (medical clearance vs. Pharmacy clearance to hold med vs. Both)? both  4. Are there any medications that need to be held prior to surgery and how long?brilinta-need direction  5. Practice name and name of physician performing surgery? Dr Iran Planas  6. What is your office phone number [phone]    7.   What is your office fax number [phone]-ashley hilton  8.   Anesthesia type (None, local, MAC, general) ? local   Fredia Beets 07/22/2019, 4:12 PM  _________________________________________________________________   (provider comments below)

## 2019-07-23 NOTE — Telephone Encounter (Signed)
She cannot interrupt her DAPT/Brilinta until March 2021.  Carpal tunnel surgery is elective

## 2019-07-23 NOTE — Telephone Encounter (Signed)
   Primary Cardiologist: Quay Burow, MD  Chart reviewed as part of pre-operative protocol coverage. TORRENCE HOLCK has a hx of inferior STEMI 12/10/2018 with cath revealing an occluded RCA that appeared to be a CTO and high-grade mid and distal LAD stenoses which were stented with drug-eluting stents. She had a 50% AV groove circumflex stenosis which was treated medically. 2D echo revealed her EF to be 45 to 50%. She is on aspirin and Brilinta and cannot interrupt DAPT until 11/2019 for an elective procedure.   Pre-op covering staff: - Please contact requesting surgeon's office via preferred method (i.e, phone, fax) to inform them of te need to off on elective surgery until at least 11/2019. At that time, they will need to re-submit cardiac clearance forms.    Kathyrn Drown, NP 07/23/2019, 3:28 PM

## 2019-07-23 NOTE — Telephone Encounter (Signed)
Per Kathyrn Drown, NP Pre OP provider I called to inform Dr. Elayne Guerin surgery scheduler of the recommendations in regards to Brilinta and surgery. No one picked and the recording for the office kept repeating. I then faxed notes to Dr. Gavin Pound. I will remove from the call back pool.

## 2019-07-31 DIAGNOSIS — N1831 Chronic kidney disease, stage 3a: Secondary | ICD-10-CM | POA: Diagnosis not present

## 2019-07-31 DIAGNOSIS — E039 Hypothyroidism, unspecified: Secondary | ICD-10-CM | POA: Diagnosis not present

## 2019-07-31 DIAGNOSIS — E1122 Type 2 diabetes mellitus with diabetic chronic kidney disease: Secondary | ICD-10-CM | POA: Diagnosis not present

## 2019-07-31 DIAGNOSIS — E782 Mixed hyperlipidemia: Secondary | ICD-10-CM | POA: Diagnosis not present

## 2019-07-31 DIAGNOSIS — F4321 Adjustment disorder with depressed mood: Secondary | ICD-10-CM | POA: Diagnosis not present

## 2019-07-31 DIAGNOSIS — I10 Essential (primary) hypertension: Secondary | ICD-10-CM | POA: Diagnosis not present

## 2019-07-31 DIAGNOSIS — E7800 Pure hypercholesterolemia, unspecified: Secondary | ICD-10-CM | POA: Diagnosis not present

## 2019-07-31 DIAGNOSIS — I2510 Atherosclerotic heart disease of native coronary artery without angina pectoris: Secondary | ICD-10-CM | POA: Diagnosis not present

## 2019-07-31 DIAGNOSIS — F329 Major depressive disorder, single episode, unspecified: Secondary | ICD-10-CM | POA: Diagnosis not present

## 2019-08-13 DIAGNOSIS — G5602 Carpal tunnel syndrome, left upper limb: Secondary | ICD-10-CM | POA: Diagnosis not present

## 2019-08-18 DIAGNOSIS — I2510 Atherosclerotic heart disease of native coronary artery without angina pectoris: Secondary | ICD-10-CM | POA: Diagnosis not present

## 2019-08-18 DIAGNOSIS — E663 Overweight: Secondary | ICD-10-CM | POA: Diagnosis not present

## 2019-08-18 DIAGNOSIS — M2550 Pain in unspecified joint: Secondary | ICD-10-CM | POA: Diagnosis not present

## 2019-08-18 DIAGNOSIS — E1122 Type 2 diabetes mellitus with diabetic chronic kidney disease: Secondary | ICD-10-CM | POA: Diagnosis not present

## 2019-08-18 DIAGNOSIS — E039 Hypothyroidism, unspecified: Secondary | ICD-10-CM | POA: Diagnosis not present

## 2019-08-18 DIAGNOSIS — I10 Essential (primary) hypertension: Secondary | ICD-10-CM | POA: Diagnosis not present

## 2019-08-18 DIAGNOSIS — E7800 Pure hypercholesterolemia, unspecified: Secondary | ICD-10-CM | POA: Diagnosis not present

## 2019-08-18 DIAGNOSIS — F331 Major depressive disorder, recurrent, moderate: Secondary | ICD-10-CM | POA: Diagnosis not present

## 2019-09-15 ENCOUNTER — Other Ambulatory Visit: Payer: Self-pay | Admitting: Physician Assistant

## 2019-09-27 ENCOUNTER — Other Ambulatory Visit: Payer: Self-pay | Admitting: Physician Assistant

## 2019-11-09 ENCOUNTER — Other Ambulatory Visit: Payer: Self-pay | Admitting: Physician Assistant

## 2019-11-15 DIAGNOSIS — Z23 Encounter for immunization: Secondary | ICD-10-CM | POA: Diagnosis not present

## 2019-12-14 DIAGNOSIS — Z23 Encounter for immunization: Secondary | ICD-10-CM | POA: Diagnosis not present

## 2019-12-17 ENCOUNTER — Ambulatory Visit (INDEPENDENT_AMBULATORY_CARE_PROVIDER_SITE_OTHER): Payer: Medicare Other | Admitting: Physician Assistant

## 2019-12-17 ENCOUNTER — Other Ambulatory Visit: Payer: Self-pay

## 2019-12-17 ENCOUNTER — Encounter: Payer: Self-pay | Admitting: Physician Assistant

## 2019-12-17 VITALS — BP 130/78 | HR 80 | Temp 97.0°F | Ht 64.0 in | Wt 177.0 lb

## 2019-12-17 DIAGNOSIS — E119 Type 2 diabetes mellitus without complications: Secondary | ICD-10-CM

## 2019-12-17 DIAGNOSIS — E785 Hyperlipidemia, unspecified: Secondary | ICD-10-CM | POA: Diagnosis not present

## 2019-12-17 DIAGNOSIS — I2510 Atherosclerotic heart disease of native coronary artery without angina pectoris: Secondary | ICD-10-CM

## 2019-12-17 DIAGNOSIS — I10 Essential (primary) hypertension: Secondary | ICD-10-CM | POA: Diagnosis not present

## 2019-12-17 NOTE — Patient Instructions (Addendum)
Medication Instructions:   STOP Brilinta after finishing the current bottle at home  Continue to take Aspirin 81 mg daily  *If you need a refill on your cardiac medications before your next appointment, please call your pharmacy*  Lab Work: NONE ordered at this time of appointment   If you have labs (blood work) drawn today and your tests are completely normal, you will receive your results only by: Marland Kitchen MyChart Message (if you have MyChart) OR . A paper copy in the mail If you have any lab test that is abnormal or we need to change your treatment, we will call you to review the results.  Testing/Procedures: NONE ordered at this time of appointment   Follow-Up: At [facility], you and your health needs are our priority.  As part of our continuing mission to provide you with exceptional heart care, we have created designated Provider Care Teams.  These Care Teams include your primary Cardiologist (physician) and Advanced Practice Providers (APPs -  Physician Assistants and Nurse Practitioners) who all work together to provide you with the care you need, when you need it.  Your next appointment:   6 month(s)  The format for your next appointment:   In Person  Provider:   Quay Burow, MD  Other Instructions

## 2019-12-17 NOTE — Progress Notes (Signed)
Cardiology Office Note:    Date:  12/19/2019   ID:  Desiree Rasmussen, DOB 01-Nov-1940, MRN [id number]  PCP:  Seward Carol, MD  Cardiologist:  Quay Burow, MD  Electrophysiologist:  None   Referring MD: Seward Carol, MD   Chief Complaint  Patient presents with  . Follow-up    seen for Dr. Gwenlyn Found    History of Present Illness:    Desiree Rasmussen is a 79 y.o. female with a hx of hypertension, hyperlipidemia, DM 2 and CAD. She has a significant family history of heart disease. She had a previous chest pain work-up with a Myoview on 05/01/2012 which showed EF 73%, no significant ischemia. She presented to the hospital on 12/10/2018 as a inferior STEMI.  Emergent cardiac catheterization was performed which revealed 100% occluded proximal to mid RCA lesion with left-to-right collateral, 99% proximal LAD lesion treated with resolute Onyx 2.5 x 15 mm DES, 80% mid LAD lesion treated with resolute Onyx 2.0 x 18 mm DES, 50% mid left circumflex residual and 40% ostial D1 residual, EF 35 to 45%.  RCA total occlusion was unable to be crossed with a wire and therefore managed medically.  It was felt that patient likely had a completed inferior infarct prior to arrival.  Echocardiogram obtained on 12/11/2018 showed EF 45 to 50%, moderate hypokinesis of the left ventricular basal mid inferior wall.  Serial troponin peaked at 23.54 in the hospital.  Postprocedure patient was placed on aspirin and Brilinta.  Desiree Rasmussen presents today for cardiology office visit.  She denies any recent chest pain or exertional dyspnea.  She has already received her second Materna COVID-19 vaccine on 12/14/2019.  Instead of taking 80 mg Lipitor, she has self reduced to Lipitor down to 40 mg daily around last July.  Despite so, her cholesterol seems to be very well controlled on the last of the repeat lab work in November 2020.  Total cholesterol was 135, HDL 44, LDL 63, triglyceride 140.  I recommended continue on the current  course of Lipitor 40 mg daily.  Otherwise, she does not have any lower extremity edema, orthopnea or PND.  She has completed 1 year course of dual antiplatelet therapy and may stop the Brilinta at this point.  She is aware to continue aspirin 81 mg lifelong.  She can follow-up with Dr. Gwenlyn Found in 6 months with EKG at that time.   Past Medical History:  Diagnosis Date  . BPPV (benign paroxysmal positional vertigo), left   . Depression   . Family history of heart disease   . GERD (gastroesophageal reflux disease)   . History of nuclear stress test 05/01/2012   dr berry   normal lexiscan nuclear study w/ no ischemia/  normal LV function and wall motion , ef 73%  . Hyperlipidemia   . Hypertension   . Hypothyroidism   . OA (osteoarthritis)   . Type 2 diabetes mellitus ([place])   . Wears contact lenses    left eye only  . Wears partial dentures    upper    Past Surgical History:  Procedure Laterality Date  . CATARACT EXTRACTION W/ INTRAOCULAR LENS  IMPLANT, BILATERAL  06/2018  . [place];  1972  . CORONARY/GRAFT ACUTE MI REVASCULARIZATION N/A 12/10/2018   Procedure: CORONARY/GRAFT ACUTE MI REVASCULARIZATION;  Surgeon: Wellington Hampshire, MD;  Location: [place] [facility];  Service: Cardiovascular;  Laterality: N/A;  . CYSTECTOMY     sebaceous cyst on breast  . LEFT  HEART CATH AND CORONARY ANGIOGRAPHY N/A 12/10/2018   Procedure: LEFT HEART CATH AND CORONARY ANGIOGRAPHY;  Surgeon: Wellington Hampshire, MD;  Location: [place] [facility];  Service: Cardiovascular;  Laterality: N/A;  . TOTAL KNEE ARTHROPLASTY Right 06/11/2017   Procedure: RIGHT TOTAL KNEE ARTHROPLASTY;  Surgeon: Paralee Cancel, MD;  Location: [facility];  Service: Orthopedics;  Laterality: Right;  70 mins  . TOTAL KNEE ARTHROPLASTY Left 08/19/2018   Procedure: TOTAL KNEE ARTHROPLASTY;  Surgeon: Paralee Cancel, MD;  Location: [facility];  Service: Orthopedics;  Laterality: Left;    Current Medications: Current Meds  Medication  Sig  . acetaminophen (TYLENOL) 500 MG tablet Take 500-1,000 mg by mouth every 8 (eight) hours as needed for mild pain, moderate pain or headache.  Marland Kitchen aspirin 81 MG chewable tablet aspirin 81 mg chewable tablet  Chew 1 tablet every day by oral route.  Marland Kitchen atorvastatin (LIPITOR) 80 MG tablet Take 1 tablet (80 mg total) by mouth daily at 6 PM. (Patient taking differently: Take 40 mg by mouth daily at 6 PM. Take half a tablet (40 mg) daily)  . carvedilol (COREG) 6.25 MG tablet TAKE 1 TABLET BY MOUTH TWICE A DAY WITH A MEAL  . clorazepate (TRANXENE) 7.5 MG tablet Take 7.5 mg by mouth See admin instructions. Take 7.5 mg by mouth at bedtime and an additional 7.5 mg once a day as needed for anxiousness  . fluticasone (FLONASE) 50 MCG/ACT nasal spray Place 2 sprays into both nostrils daily as needed for allergies or rhinitis.  . furosemide (LASIX) 20 MG tablet furosemide 20 mg tablet  . glimepiride (AMARYL) 2 MG tablet glimepiride 2 mg tablet  . hydrocortisone (PROCTOSOL HC) 2.5 % rectal cream Proctosol HC 2.5 % topical cream perineal applicator  . JARDIANCE 10 MG TABS tablet Take 10 mg by mouth daily.  Marland Kitchen lisinopril (ZESTRIL) 20 MG tablet TAKE 1 TABLET BY MOUTH EVERY DAY  . liver oil-zinc oxide (DESITIN) 40 % ointment Apply 1 application topically as needed for irritation (UNDER THE BREASTS AND BETWEEN SKIN FOLDS).   Marland Kitchen loratadine (CLARITIN) 10 MG tablet Take 10 mg by mouth daily as needed for allergies or rhinitis.  . metFORMIN (GLUCOPHAGE-XR) 500 MG 24 hr tablet Take 500 mg by mouth at bedtime.  . Multiple Vitamins-Minerals (MULTIVITAMIN WITH MINERALS) tablet Take 1 tablet by mouth 3 (three) times a week.   Marland Kitchen SYNTHROID 100 MCG tablet Take 100 mcg by mouth daily before breakfast.   . ticagrelor (BRILINTA) 90 MG TABS tablet Brilinta 90 mg tablet  . triamcinolone cream (KENALOG) 0.1 % triamcinolone acetonide 0.1 % topical cream  . vitamin C (ASCORBIC ACID) 500 MG tablet Take 500-1,000 mg by mouth daily.      Allergies:   Lexapro [escitalopram oxalate], Doxycycline, Erythromycin, Erythromycin base, Hydrocodone, Macrobid [nitrofurantoin], Sulfa antibiotics, and Zoloft [sertraline hcl]   Social History   Socioeconomic History  . Marital status: Widowed    Spouse name: Not on file  . Number of children: 2  . Years of education: Not on file  . Highest education level: Not on file  Occupational History  . Not on file  Tobacco Use  . Smoking status: Never Smoker  . Smokeless tobacco: Never Used  Substance and Sexual Activity  . Alcohol use: No  . Drug use: Never  . Sexual activity: Not on file  Other Topics Concern  . Not on file  Social History Narrative  . Not on file   Social Determinants of Health  Financial Resource Strain:   . Difficulty of Paying Living Expenses:   Food Insecurity:   . Worried About Charity fundraiser in the Last Year:   . Arboriculturist in the Last Year:   Transportation Needs:   . Film/video editor (Medical):   Marland Kitchen Lack of Transportation (Non-Medical):   Physical Activity:   . Days of Exercise per Week:   . Minutes of Exercise per Session:   Stress:   . Feeling of Stress :   Social Connections:   . Frequency of Communication with Friends and Family:   . Frequency of Social Gatherings with Friends and Family:   . Attends Religious Services:   . Active Member of Clubs or Organizations:   . Attends Archivist Meetings:   Marland Kitchen Marital Status:      Family History: The patient's family history includes Breast cancer (age of onset: 55) in her maternal aunt; Colon cancer in her maternal uncle; Colon cancer (age of onset: 62) in her mother; Heart disease in her brother; Heart disease (age of onset: 70) in her father.  ROS:   Please see the history of present illness.     All other systems reviewed and are negative.  EKGs/Labs/Other Studies Reviewed:    The following studies were reviewed today:  Cath 12/10/2018  Prox RCA to Mid RCA  lesion is 100% stenosed.  Prox LAD lesion is 99% stenosed.  Post intervention, there is a 0% residual stenosis.  A drug-eluting stent was successfully placed using a STENT RESOLUTE ONYX 2.5X15.  Ost 1st Diag lesion is 40% stenosed.  Mid LAD lesion is 80% stenosed.  Post intervention, there is a 5% residual stenosis.  A drug-eluting stent was successfully placed using a STENT RESOLUTE ONYX 2.0X18.  Mid Cx lesion is 50% stenosed.  LV end diastolic pressure is mildly elevated.  The left ventricular ejection fraction is 35-45% by visual estimate.  There is moderate left ventricular systolic dysfunction.   1.  Severe two-vessel coronary artery disease.  The patient presented with inferior ST elevation myocardial infarction with symptoms that started about 2 weeks prior to presentation.  The culprit for her STEMI is likely an acute on chronic occlusion in the RCA.  There is also high-grade stenosis in the proximal LAD and mid LAD.  Moderate disease in the left circumflex. 2.  Moderately reduced LV systolic function with an EF of 35 to 40%.  Mildly elevated left ventricular end-diastolic pressure. 3. Inability to cross the occlusion in the right coronary artery. 4. Successful angioplasty and drug-eluting stent placement to the LAD x2.  Recommendations: Dual antiplatelet therapy for at least 1 year. The patient will likely finish an inferior infarct but she has reasonable left-to-right collaterals. The patient's chest pain improved at the end of the procedure but as expected, did not resolve and thus I elected to start her on nitroglycerin drip.  She also continued to have inferior ST elevation in her EKG and this is also expected.   Echo 12/11/2018 1. Moderate hypokinesis of the left ventricular, basal-mid inferior wall.  2. The left ventricle has mildly reduced systolic function, with an  ejection fraction of 45-50%. The cavity size was normal. There is mildly  increased left  ventricular wall thickness. Left ventricular diastolic  Doppler parameters are consistent with  impaired relaxation.  3. The right ventricle has normal systolic function. The cavity was  normal. There is no increase in right ventricular wall thickness.  4. The  aortic valve is tricuspid Moderate thickening of the aortic valve  Moderate calcification of the aortic valve.   EKG:  EKG is not ordered today.   Recent Labs: No results found for requested labs within last 8760 hours.  Recent Lipid Panel    Component Value Date/Time   CHOL 175 12/10/2018 1500   TRIG 276 (H) 12/10/2018 1500   HDL 43 12/10/2018 1500   CHOLHDL 4.1 12/10/2018 1500   VLDL 55 (H) 12/10/2018 1500   LDLCALC 77 12/10/2018 1500    Physical Exam:    VS:  BP 130/78   Pulse 80   Temp (!) 97 F (36.1 C)   Ht 5\' 4"  (1.626 m)   Wt 177 lb (80.3 kg)   SpO2 95%   BMI 30.38 kg/m     Wt Readings from Last 3 Encounters:  12/17/19 177 lb (80.3 kg)  06/17/19 181 lb (82.1 kg)  01/01/19 183 lb (83 kg)     GEN:  Well nourished, well developed in no acute distress HEENT: Normal NECK: No JVD; No carotid bruits LYMPHATICS: No lymphadenopathy CARDIAC: RRR, no murmurs, rubs, gallops RESPIRATORY:  Clear to auscultation without rales, wheezing or rhonchi  ABDOMEN: Soft, non-tender, non-distended MUSCULOSKELETAL:  No edema; No deformity  SKIN: Warm and dry NEUROLOGIC:  Alert and oriented x 3 PSYCHIATRIC:  Normal affect   ASSESSMENT:    1. Coronary artery disease involving native coronary artery of native heart without angina pectoris   2. Essential hypertension   3. Hyperlipidemia LDL goal <70   4. Controlled type 2 diabetes mellitus without complication, without long-term current use of insulin (HCC)    PLAN:    In order of problems listed above:  1. CAD: Denies any chest pain.  She may stop the Brilinta as she has finished 1 year course of dual antiplatelet therapy.  She is aware that she will need to  continue aspirin lifelong.  2. Hypertension: Blood pressure stable on current therapy  3. Hyperlipidemia: On Lipitor.  Despite the fact she self decreased Lipitor dose to 40 mg daily, however last lab work shows very well controlled cholesterol  4. DM2: Managed by primary care provider.   Medication Adjustments/Labs and Tests Ordered: Current medicines are reviewed at length with the patient today.  Concerns regarding medicines are outlined above.  No orders of the defined types were placed in this encounter.  No orders of the defined types were placed in this encounter.   Patient Instructions  Medication Instructions:   STOP Brilinta after finishing the current bottle at home  Continue to take Aspirin 81 mg daily  *If you need a refill on your cardiac medications before your next appointment, please call your pharmacy*  Lab Work: NONE ordered at this time of appointment   If you have labs (blood work) drawn today and your tests are completely normal, you will receive your results only by: Marland Kitchen MyChart Message (if you have MyChart) OR . A paper copy in the mail If you have any lab test that is abnormal or we need to change your treatment, we will call you to review the results.  Testing/Procedures: NONE ordered at this time of appointment   Follow-Up: At [facility], you and your health needs are our priority.  As part of our continuing mission to provide you with exceptional heart care, we have created designated Provider Care Teams.  These Care Teams include your primary Cardiologist (physician) and Advanced Practice Providers (APPs -  Physician Assistants  and Nurse Practitioners) who all work together to provide you with the care you need, when you need it.  Your next appointment:   6 month(s)  The format for your next appointment:   In Person  Provider:   Quay Burow, MD  Other Instructions      Signed, Almyra Deforest, [place]  12/19/2019 11:21 PM    [place]

## 2019-12-18 DIAGNOSIS — F329 Major depressive disorder, single episode, unspecified: Secondary | ICD-10-CM | POA: Diagnosis not present

## 2019-12-18 DIAGNOSIS — F4321 Adjustment disorder with depressed mood: Secondary | ICD-10-CM | POA: Diagnosis not present

## 2019-12-18 DIAGNOSIS — I10 Essential (primary) hypertension: Secondary | ICD-10-CM | POA: Diagnosis not present

## 2019-12-18 DIAGNOSIS — N183 Chronic kidney disease, stage 3 unspecified: Secondary | ICD-10-CM | POA: Diagnosis not present

## 2019-12-18 DIAGNOSIS — E039 Hypothyroidism, unspecified: Secondary | ICD-10-CM | POA: Diagnosis not present

## 2019-12-18 DIAGNOSIS — F331 Major depressive disorder, recurrent, moderate: Secondary | ICD-10-CM | POA: Diagnosis not present

## 2019-12-18 DIAGNOSIS — I2510 Atherosclerotic heart disease of native coronary artery without angina pectoris: Secondary | ICD-10-CM | POA: Diagnosis not present

## 2019-12-18 DIAGNOSIS — E782 Mixed hyperlipidemia: Secondary | ICD-10-CM | POA: Diagnosis not present

## 2019-12-18 DIAGNOSIS — E1122 Type 2 diabetes mellitus with diabetic chronic kidney disease: Secondary | ICD-10-CM | POA: Diagnosis not present

## 2019-12-18 DIAGNOSIS — E7800 Pure hypercholesterolemia, unspecified: Secondary | ICD-10-CM | POA: Diagnosis not present

## 2019-12-19 ENCOUNTER — Encounter: Payer: Self-pay | Admitting: Physician Assistant

## 2019-12-24 DIAGNOSIS — M25512 Pain in left shoulder: Secondary | ICD-10-CM | POA: Diagnosis not present

## 2019-12-24 DIAGNOSIS — M25511 Pain in right shoulder: Secondary | ICD-10-CM | POA: Diagnosis not present

## 2020-01-19 DIAGNOSIS — H1045 Other chronic allergic conjunctivitis: Secondary | ICD-10-CM | POA: Diagnosis not present

## 2020-01-19 DIAGNOSIS — H10433 Chronic follicular conjunctivitis, bilateral: Secondary | ICD-10-CM | POA: Diagnosis not present

## 2020-01-19 DIAGNOSIS — H1033 Unspecified acute conjunctivitis, bilateral: Secondary | ICD-10-CM | POA: Diagnosis not present

## 2020-01-31 ENCOUNTER — Other Ambulatory Visit: Payer: Self-pay | Admitting: Physician Assistant

## 2020-02-16 DIAGNOSIS — L821 Other seborrheic keratosis: Secondary | ICD-10-CM | POA: Diagnosis not present

## 2020-02-16 DIAGNOSIS — L720 Epidermal cyst: Secondary | ICD-10-CM | POA: Diagnosis not present

## 2020-02-16 DIAGNOSIS — D225 Melanocytic nevi of trunk: Secondary | ICD-10-CM | POA: Diagnosis not present

## 2020-02-16 DIAGNOSIS — D2371 Other benign neoplasm of skin of right lower limb, including hip: Secondary | ICD-10-CM | POA: Diagnosis not present

## 2020-02-16 DIAGNOSIS — L814 Other melanin hyperpigmentation: Secondary | ICD-10-CM | POA: Diagnosis not present

## 2020-02-16 DIAGNOSIS — C44619 Basal cell carcinoma of skin of left upper limb, including shoulder: Secondary | ICD-10-CM | POA: Diagnosis not present

## 2020-02-17 DIAGNOSIS — F331 Major depressive disorder, recurrent, moderate: Secondary | ICD-10-CM | POA: Diagnosis not present

## 2020-02-17 DIAGNOSIS — I2510 Atherosclerotic heart disease of native coronary artery without angina pectoris: Secondary | ICD-10-CM | POA: Diagnosis not present

## 2020-02-17 DIAGNOSIS — E039 Hypothyroidism, unspecified: Secondary | ICD-10-CM | POA: Diagnosis not present

## 2020-02-17 DIAGNOSIS — I10 Essential (primary) hypertension: Secondary | ICD-10-CM | POA: Diagnosis not present

## 2020-02-17 DIAGNOSIS — E663 Overweight: Secondary | ICD-10-CM | POA: Diagnosis not present

## 2020-02-17 DIAGNOSIS — E1169 Type 2 diabetes mellitus with other specified complication: Secondary | ICD-10-CM | POA: Diagnosis not present

## 2020-02-17 DIAGNOSIS — E7800 Pure hypercholesterolemia, unspecified: Secondary | ICD-10-CM | POA: Diagnosis not present

## 2020-02-17 DIAGNOSIS — R413 Other amnesia: Secondary | ICD-10-CM | POA: Diagnosis not present

## 2020-02-23 DIAGNOSIS — E538 Deficiency of other specified B group vitamins: Secondary | ICD-10-CM | POA: Diagnosis not present

## 2020-03-02 DIAGNOSIS — C44619 Basal cell carcinoma of skin of left upper limb, including shoulder: Secondary | ICD-10-CM | POA: Diagnosis not present

## 2020-03-05 DIAGNOSIS — R05 Cough: Secondary | ICD-10-CM | POA: Diagnosis not present

## 2020-03-05 DIAGNOSIS — J0100 Acute maxillary sinusitis, unspecified: Secondary | ICD-10-CM | POA: Diagnosis not present

## 2020-03-08 DIAGNOSIS — E538 Deficiency of other specified B group vitamins: Secondary | ICD-10-CM | POA: Diagnosis not present

## 2020-03-15 DIAGNOSIS — E538 Deficiency of other specified B group vitamins: Secondary | ICD-10-CM | POA: Diagnosis not present

## 2020-04-18 DIAGNOSIS — E538 Deficiency of other specified B group vitamins: Secondary | ICD-10-CM | POA: Diagnosis not present

## 2020-05-17 ENCOUNTER — Other Ambulatory Visit: Payer: Self-pay | Admitting: Physician Assistant

## 2020-05-18 DIAGNOSIS — F329 Major depressive disorder, single episode, unspecified: Secondary | ICD-10-CM | POA: Diagnosis not present

## 2020-05-18 DIAGNOSIS — E7800 Pure hypercholesterolemia, unspecified: Secondary | ICD-10-CM | POA: Diagnosis not present

## 2020-05-18 DIAGNOSIS — N183 Chronic kidney disease, stage 3 unspecified: Secondary | ICD-10-CM | POA: Diagnosis not present

## 2020-05-18 DIAGNOSIS — E039 Hypothyroidism, unspecified: Secondary | ICD-10-CM | POA: Diagnosis not present

## 2020-05-18 DIAGNOSIS — E782 Mixed hyperlipidemia: Secondary | ICD-10-CM | POA: Diagnosis not present

## 2020-05-18 DIAGNOSIS — I2510 Atherosclerotic heart disease of native coronary artery without angina pectoris: Secondary | ICD-10-CM | POA: Diagnosis not present

## 2020-05-18 DIAGNOSIS — F4321 Adjustment disorder with depressed mood: Secondary | ICD-10-CM | POA: Diagnosis not present

## 2020-05-18 DIAGNOSIS — I10 Essential (primary) hypertension: Secondary | ICD-10-CM | POA: Diagnosis not present

## 2020-05-18 DIAGNOSIS — E1169 Type 2 diabetes mellitus with other specified complication: Secondary | ICD-10-CM | POA: Diagnosis not present

## 2020-05-18 DIAGNOSIS — E1122 Type 2 diabetes mellitus with diabetic chronic kidney disease: Secondary | ICD-10-CM | POA: Diagnosis not present

## 2020-05-18 DIAGNOSIS — F331 Major depressive disorder, recurrent, moderate: Secondary | ICD-10-CM | POA: Diagnosis not present

## 2020-05-19 DIAGNOSIS — E538 Deficiency of other specified B group vitamins: Secondary | ICD-10-CM | POA: Diagnosis not present

## 2020-06-20 DIAGNOSIS — E538 Deficiency of other specified B group vitamins: Secondary | ICD-10-CM | POA: Diagnosis not present

## 2020-06-21 ENCOUNTER — Ambulatory Visit (INDEPENDENT_AMBULATORY_CARE_PROVIDER_SITE_OTHER): Payer: Medicare Other | Admitting: Cardiovascular Disease

## 2020-06-21 ENCOUNTER — Other Ambulatory Visit: Payer: Self-pay

## 2020-06-21 ENCOUNTER — Encounter: Payer: Self-pay | Admitting: Cardiovascular Disease

## 2020-06-21 DIAGNOSIS — E782 Mixed hyperlipidemia: Secondary | ICD-10-CM

## 2020-06-21 DIAGNOSIS — I2510 Atherosclerotic heart disease of native coronary artery without angina pectoris: Secondary | ICD-10-CM | POA: Diagnosis not present

## 2020-06-21 DIAGNOSIS — I10 Essential (primary) hypertension: Secondary | ICD-10-CM

## 2020-06-21 DIAGNOSIS — I2119 ST elevation (STEMI) myocardial infarction involving other coronary artery of inferior wall: Secondary | ICD-10-CM

## 2020-06-21 NOTE — Assessment & Plan Note (Signed)
History of acute ST segment elevation myocardial infarction 12/10/2018 with cardiac catheterization performed by Dr. Fletcher Anon via the right radial approach revealing an occluded RCA which appeared to be a CTO with left-to-right collaterals, high-grade mid and distal LAD stenoses which were both stented with drug-eluting stents and a 50% mid AV groove circumflex stenosis.  Her ejection fraction at the time of cath was 35 to 45% and subsequent echoes revealed slight increase up to 45 to 50%.  She denies chest pain or shortness of breath.  She did stop Brilinta when she saw Almyra Deforest PA-C 6 months ago.

## 2020-06-21 NOTE — Patient Instructions (Signed)
Medication Instructions:  No Changes In Medications at this time.  *If you need a refill on your cardiac medications before your next appointment, please call your pharmacy*  Lab Work: None Ordered At This Time.  If you have labs (blood work) drawn today and your tests are completely normal, you will receive your results only by: Marland Kitchen MyChart Message (if you have MyChart) OR . A paper copy in the mail If you have any lab test that is abnormal or we need to change your treatment, we will call you to review the results.  Testing/Procedures: None Ordered At This Time.   Follow-Up: At [facility], you and your health needs are our priority.  As part of our continuing mission to provide you with exceptional heart care, we have created designated Provider Care Teams.  These Care Teams include your primary Cardiologist (physician) and Advanced Practice Providers (APPs -  Physician Assistants and Nurse Practitioners) who all work together to provide you with the care you need, when you need it.  Your next appointment:   6 month(s)  The format for your next appointment:   In Person  Provider:    You may see Almyra Deforest PA-C in 6 months   And Will see Dr. Gwenlyn Found in 12 Months

## 2020-06-21 NOTE — Progress Notes (Signed)
06/21/2020 Desiree Rasmussen   1941/08/17  [id number]  Primary Physician Seward Carol, MD Primary Cardiologist: Lorretta Harp MD [name], [place]  HPI:  Desiree Rasmussen is a 79 y.o.  moderatelyoverweight, married Caucasian female, mother of two, grandmother to two grandchildren, whose husband , Desiree Rasmussen, unfortunately passed away on 11/15/13. He was a patient of mine as well. He apparently died of complications of pneumonia. I last saw her in the office 06/17/2019.  She has a history of hypertension and hyperlipidemia, as well as a family history of heart disease. She had chest pain and ruled out myocardial infarction on an ER visit earlier last year, and when I saw her she was complaining of atypical chest pain. A Myoview stress test performed in our office on May 01, 2012, was entirely normal. I thought her pain was more musculoskeletal and/or radicular and her symptoms subsequently resolved. Since I saw her a year ago she's remained fairly stable and is asymptomatic.She apparently needs a right total knee replacement by Dr. Olin9/11/18.  She presented with an inferior STEMI 12/10/2018 and had urgent cath by Dr. Fletcher Anon via the right radial approach revealing an occluded RCA which was deemed to be an CTO with left-to-right collaterals, high-grade mid and distal LAD stenoses which were both stented with drug-eluting stents and a 50% mid AV groove circumflex stenosis.  Her EF at the time of cath was 35 to 45% and subsequent echo 45 to 50%.  She remains on aspirin and Brilinta was discontinued 6 months ago.   Since I saw her a year ago she has been stable.  She denies chest pain or shortness of breath.  Current Meds  Medication Sig  . acetaminophen (TYLENOL) 500 MG tablet Take 500-1,000 mg by mouth every 8 (eight) hours as needed for mild pain, moderate pain or headache.  Marland Kitchen aspirin 81 MG chewable tablet aspirin 81 mg chewable tablet  Chew 1 tablet every day by oral route.  Marland Kitchen  atorvastatin (LIPITOR) 80 MG tablet TAKE 1 TABLET BY MOUTH EVERY DAY AT 6PM  . carvedilol (COREG) 6.25 MG tablet TAKE 1 TABLET BY MOUTH TWICE A DAY WITH MEALS  . clorazepate (TRANXENE) 7.5 MG tablet Take 7.5 mg by mouth See admin instructions. Take 7.5 mg by mouth at bedtime and an additional 7.5 mg once a day as needed for anxiousness  . fluticasone (FLONASE) 50 MCG/ACT nasal spray Place 2 sprays into both nostrils daily as needed for allergies or rhinitis.  . furosemide (LASIX) 20 MG tablet furosemide 20 mg tablet  . glimepiride (AMARYL) 2 MG tablet glimepiride 2 mg tablet  . hydrocortisone (PROCTOSOL HC) 2.5 % rectal cream Proctosol HC 2.5 % topical cream perineal applicator  . JARDIANCE 10 MG TABS tablet Take 10 mg by mouth daily.  Marland Kitchen lisinopril (ZESTRIL) 20 MG tablet TAKE 1 TABLET BY MOUTH EVERY DAY  . liver oil-zinc oxide (DESITIN) 40 % ointment Apply 1 application topically as needed for irritation (UNDER THE BREASTS AND BETWEEN SKIN FOLDS).   Marland Kitchen loratadine (CLARITIN) 10 MG tablet Take 10 mg by mouth daily as needed for allergies or rhinitis.  . metFORMIN (GLUCOPHAGE-XR) 500 MG 24 hr tablet Take 500 mg by mouth at bedtime.  . Multiple Vitamins-Minerals (MULTIVITAMIN WITH MINERALS) tablet Take 1 tablet by mouth 3 (three) times a week.   . nitroGLYCERIN (NITROSTAT) 0.4 MG SL tablet Place 1 tablet (0.4 mg total) under the tongue every 5 (five) minutes as needed for chest  pain.  Marland Kitchen SYNTHROID 100 MCG tablet Take 100 mcg by mouth daily before breakfast.   . triamcinolone cream (KENALOG) 0.1 % triamcinolone acetonide 0.1 % topical cream  . vitamin C (ASCORBIC ACID) 500 MG tablet Take 500-1,000 mg by mouth daily.     Allergies  Allergen Reactions  . Lexapro [Escitalopram Oxalate] Diarrhea  . Doxycycline Nausea And Vomiting    Upset stomach, also  . Erythromycin Other (See Comments)    "upset stomach"  . Erythromycin Base Nausea Only  . Hydrocodone Other (See Comments)    Affected mood  negatively 500 Mg   . Macrobid [Nitrofurantoin] Other (See Comments)    Didn't work  . Sulfa Antibiotics Rash  . Zoloft [Sertraline Hcl] Palpitations    Social History   Socioeconomic History  . Marital status: Widowed    Spouse name: Not on file  . Number of children: 2  . Years of education: Not on file  . Highest education level: Not on file  Occupational History  . Not on file  Tobacco Use  . Smoking status: Never Smoker  . Smokeless tobacco: Never Used  Vaping Use  . Vaping Use: Never used  Substance and Sexual Activity  . Alcohol use: No  . Drug use: Never  . Sexual activity: Not on file  Other Topics Concern  . Not on file  Social History Narrative  . Not on file   Social Determinants of Health   Financial Resource Strain:   . Difficulty of Paying Living Expenses: Not on file  Food Insecurity:   . Worried About Charity fundraiser in the Last Year: Not on file  . Ran Out of Food in the Last Year: Not on file  Transportation Needs:   . Lack of Transportation (Medical): Not on file  . Lack of Transportation (Non-Medical): Not on file  Physical Activity:   . Days of Exercise per Week: Not on file  . Minutes of Exercise per Session: Not on file  Stress:   . Feeling of Stress : Not on file  Social Connections:   . Frequency of Communication with Friends and Family: Not on file  . Frequency of Social Gatherings with Friends and Family: Not on file  . Attends Religious Services: Not on file  . Active Member of Clubs or Organizations: Not on file  . Attends Archivist Meetings: Not on file  . Marital Status: Not on file  Intimate Partner Violence:   . Fear of Current or Ex-Partner: Not on file  . Emotionally Abused: Not on file  . Physically Abused: Not on file  . Sexually Abused: Not on file     Review of Systems: General: negative for chills, fever, night sweats or weight changes.  Cardiovascular: negative for chest pain, dyspnea on exertion,  edema, orthopnea, palpitations, paroxysmal nocturnal dyspnea or shortness of breath Dermatological: negative for rash Respiratory: negative for cough or wheezing Urologic: negative for hematuria Abdominal: negative for nausea, vomiting, diarrhea, bright red blood per rectum, melena, or hematemesis Neurologic: negative for visual changes, syncope, or dizziness All other systems reviewed and are otherwise negative except as noted above.    Blood pressure (!) 173/98, pulse 88, height 5\' 4"  (1.626 m), weight 192 lb (87.1 kg), SpO2 96 %.  General appearance: alert and no distress Neck: no adenopathy, no carotid bruit, no JVD, supple, symmetrical, trachea midline and thyroid not enlarged, symmetric, no tenderness/mass/nodules Lungs: clear to auscultation bilaterally Heart: regular rate and rhythm, S1, S2  normal, no murmur, click, rub or gallop Extremities: extremities normal, atraumatic, no cyanosis or edema Pulses: 2+ and symmetric Skin: Skin color, texture, turgor normal. No rashes or lesions Neurologic: Alert and oriented X 3, normal strength and tone. Normal symmetric reflexes. Normal coordination and gait  EKG sinus rhythm at 88 without ST or T wave changes.  There was a Q wave in lead III.  I personally reviewed this EKG.  ASSESSMENT AND PLAN:   Essential hypertension History of essential hypertension a blood pressure measured today 173/98.  She checks her blood pressure at home is usually much lower than this in the 120-130 range over 70-80.  She is on carvedilol, and lisinopril.  Hyperlipidemia History of hyperlipidemia on statin therapy with lipid profile performed 02/17/2020 revealed a total cholesterol 156, LDL of 81 and HDL 47.  Acute ST elevation myocardial infarction (STEMI) of inferior wall (HCC) History of acute ST segment elevation myocardial infarction 12/10/2018 with cardiac catheterization performed by Dr. Fletcher Anon via the right radial approach revealing an occluded RCA which  appeared to be a CTO with left-to-right collaterals, high-grade mid and distal LAD stenoses which were both stented with drug-eluting stents and a 50% mid AV groove circumflex stenosis.  Her ejection fraction at the time of cath was 35 to 45% and subsequent echoes revealed slight increase up to 45 to 50%.  She denies chest pain or shortness of breath.  She did stop Brilinta when she saw Almyra Deforest PA-C 6 months ago.      Lorretta Harp MD FACP,FACC,FAHA, [facility] 06/21/2020 3:57 PM

## 2020-06-21 NOTE — Assessment & Plan Note (Signed)
History of essential hypertension a blood pressure measured today 173/98.  She checks her blood pressure at home is usually much lower than this in the 120-130 range over 70-80.  She is on carvedilol, and lisinopril.

## 2020-06-21 NOTE — Assessment & Plan Note (Signed)
History of hyperlipidemia on statin therapy with lipid profile performed 02/17/2020 revealed a total cholesterol 156, LDL of 81 and HDL 47.

## 2020-06-23 DIAGNOSIS — M25512 Pain in left shoulder: Secondary | ICD-10-CM | POA: Diagnosis not present

## 2020-06-23 DIAGNOSIS — M25511 Pain in right shoulder: Secondary | ICD-10-CM | POA: Diagnosis not present

## 2020-07-04 DIAGNOSIS — Z23 Encounter for immunization: Secondary | ICD-10-CM | POA: Diagnosis not present

## 2020-07-14 ENCOUNTER — Other Ambulatory Visit: Payer: Self-pay | Admitting: Cardiovascular Disease

## 2020-07-14 NOTE — Telephone Encounter (Signed)
*  STAT* If patient is at the pharmacy, call can be transferred to refill team.   1. Which medications need to be refilled? (please list name of each medication and dose if known)   nitroGLYCERIN (NITROSTAT) 0.4 MG SL tablet  2. Which pharmacy/location (including street and city if local pharmacy) is medication to be sent to?  [id number] - [place], [place] - [address]  3. Do they need a 30 day or 90 day supply? 30 day  

## 2020-07-15 MED ORDER — NITROGLYCERIN 0.4 MG SL SUBL: 0.4000 mg | SUBLINGUAL_TABLET | SUBLINGUAL | 12 refills | Status: DC | PRN

## 2020-07-22 DIAGNOSIS — E538 Deficiency of other specified B group vitamins: Secondary | ICD-10-CM | POA: Diagnosis not present

## 2020-07-26 DIAGNOSIS — F331 Major depressive disorder, recurrent, moderate: Secondary | ICD-10-CM | POA: Diagnosis not present

## 2020-07-26 DIAGNOSIS — F4321 Adjustment disorder with depressed mood: Secondary | ICD-10-CM | POA: Diagnosis not present

## 2020-07-26 DIAGNOSIS — E1169 Type 2 diabetes mellitus with other specified complication: Secondary | ICD-10-CM | POA: Diagnosis not present

## 2020-07-26 DIAGNOSIS — I2510 Atherosclerotic heart disease of native coronary artery without angina pectoris: Secondary | ICD-10-CM | POA: Diagnosis not present

## 2020-07-26 DIAGNOSIS — E7800 Pure hypercholesterolemia, unspecified: Secondary | ICD-10-CM | POA: Diagnosis not present

## 2020-07-26 DIAGNOSIS — N183 Chronic kidney disease, stage 3 unspecified: Secondary | ICD-10-CM | POA: Diagnosis not present

## 2020-07-26 DIAGNOSIS — F329 Major depressive disorder, single episode, unspecified: Secondary | ICD-10-CM | POA: Diagnosis not present

## 2020-07-26 DIAGNOSIS — E782 Mixed hyperlipidemia: Secondary | ICD-10-CM | POA: Diagnosis not present

## 2020-07-26 DIAGNOSIS — E1122 Type 2 diabetes mellitus with diabetic chronic kidney disease: Secondary | ICD-10-CM | POA: Diagnosis not present

## 2020-07-26 DIAGNOSIS — E039 Hypothyroidism, unspecified: Secondary | ICD-10-CM | POA: Diagnosis not present

## 2020-07-26 DIAGNOSIS — I10 Essential (primary) hypertension: Secondary | ICD-10-CM | POA: Diagnosis not present

## 2020-07-28 ENCOUNTER — Other Ambulatory Visit: Payer: Self-pay | Admitting: Physician Assistant

## 2020-07-28 NOTE — Telephone Encounter (Signed)
This is Dr. Berry's pt 

## 2020-08-03 ENCOUNTER — Other Ambulatory Visit: Payer: Self-pay | Admitting: Physician Assistant

## 2020-08-05 ENCOUNTER — Other Ambulatory Visit: Payer: Self-pay | Admitting: Internal Medicine

## 2020-08-05 ENCOUNTER — Ambulatory Visit
Admission: RE | Admit: 2020-08-05 | Discharge: 2020-08-05 | Disposition: A | Discharge status: Home / Self Care | Payer: Medicare Other | Source: Ambulatory Visit | Attending: Internal Medicine | Admitting: Internal Medicine

## 2020-08-05 DIAGNOSIS — M545 Low back pain, unspecified: Secondary | ICD-10-CM

## 2020-08-05 DIAGNOSIS — M25511 Pain in right shoulder: Secondary | ICD-10-CM | POA: Diagnosis not present

## 2020-08-05 DIAGNOSIS — M25512 Pain in left shoulder: Secondary | ICD-10-CM | POA: Diagnosis not present

## 2020-08-22 DIAGNOSIS — I2510 Atherosclerotic heart disease of native coronary artery without angina pectoris: Secondary | ICD-10-CM | POA: Diagnosis not present

## 2020-08-22 DIAGNOSIS — M545 Low back pain, unspecified: Secondary | ICD-10-CM | POA: Diagnosis not present

## 2020-08-22 DIAGNOSIS — Z1231 Encounter for screening mammogram for malignant neoplasm of breast: Secondary | ICD-10-CM | POA: Diagnosis not present

## 2020-08-22 DIAGNOSIS — I10 Essential (primary) hypertension: Secondary | ICD-10-CM | POA: Diagnosis not present

## 2020-08-22 DIAGNOSIS — E538 Deficiency of other specified B group vitamins: Secondary | ICD-10-CM | POA: Diagnosis not present

## 2020-08-22 DIAGNOSIS — R222 Localized swelling, mass and lump, trunk: Secondary | ICD-10-CM | POA: Diagnosis not present

## 2020-08-22 DIAGNOSIS — E1169 Type 2 diabetes mellitus with other specified complication: Secondary | ICD-10-CM | POA: Diagnosis not present

## 2020-08-22 DIAGNOSIS — E7800 Pure hypercholesterolemia, unspecified: Secondary | ICD-10-CM | POA: Diagnosis not present

## 2020-08-22 DIAGNOSIS — Z0000 Encounter for general adult medical examination without abnormal findings: Secondary | ICD-10-CM | POA: Diagnosis not present

## 2020-08-22 DIAGNOSIS — Z7984 Long term (current) use of oral hypoglycemic drugs: Secondary | ICD-10-CM | POA: Diagnosis not present

## 2020-08-22 DIAGNOSIS — E2839 Other primary ovarian failure: Secondary | ICD-10-CM | POA: Diagnosis not present

## 2020-08-22 DIAGNOSIS — F329 Major depressive disorder, single episode, unspecified: Secondary | ICD-10-CM | POA: Diagnosis not present

## 2020-08-22 DIAGNOSIS — F419 Anxiety disorder, unspecified: Secondary | ICD-10-CM | POA: Diagnosis not present

## 2020-08-29 ENCOUNTER — Other Ambulatory Visit: Payer: Self-pay | Admitting: Internal Medicine

## 2020-08-29 DIAGNOSIS — R222 Localized swelling, mass and lump, trunk: Secondary | ICD-10-CM

## 2020-09-04 DIAGNOSIS — Z23 Encounter for immunization: Secondary | ICD-10-CM | POA: Diagnosis not present

## 2020-09-12 DIAGNOSIS — M4310 Spondylolisthesis, site unspecified: Secondary | ICD-10-CM | POA: Diagnosis not present

## 2020-09-12 DIAGNOSIS — M419 Scoliosis, unspecified: Secondary | ICD-10-CM | POA: Diagnosis not present

## 2020-09-12 DIAGNOSIS — E119 Type 2 diabetes mellitus without complications: Secondary | ICD-10-CM | POA: Diagnosis not present

## 2020-09-12 DIAGNOSIS — M545 Low back pain, unspecified: Secondary | ICD-10-CM | POA: Diagnosis not present

## 2020-09-12 DIAGNOSIS — M5136 Other intervertebral disc degeneration, lumbar region: Secondary | ICD-10-CM | POA: Diagnosis not present

## 2020-09-12 DIAGNOSIS — N183 Chronic kidney disease, stage 3 unspecified: Secondary | ICD-10-CM | POA: Diagnosis not present

## 2020-09-12 DIAGNOSIS — M8580 Other specified disorders of bone density and structure, unspecified site: Secondary | ICD-10-CM | POA: Diagnosis not present

## 2020-09-14 ENCOUNTER — Ambulatory Visit
Admission: RE | Admit: 2020-09-14 | Discharge: 2020-09-14 | Disposition: A | Discharge status: Home / Self Care | Payer: Medicare Other | Source: Ambulatory Visit | Attending: Internal Medicine | Admitting: Internal Medicine

## 2020-09-14 DIAGNOSIS — M25511 Pain in right shoulder: Secondary | ICD-10-CM | POA: Diagnosis not present

## 2020-09-14 DIAGNOSIS — E041 Nontoxic single thyroid nodule: Secondary | ICD-10-CM | POA: Diagnosis not present

## 2020-09-14 DIAGNOSIS — I700 Atherosclerosis of aorta: Secondary | ICD-10-CM | POA: Diagnosis not present

## 2020-09-14 DIAGNOSIS — R222 Localized swelling, mass and lump, trunk: Secondary | ICD-10-CM

## 2020-10-10 DIAGNOSIS — I2510 Atherosclerotic heart disease of native coronary artery without angina pectoris: Secondary | ICD-10-CM | POA: Diagnosis not present

## 2020-10-10 DIAGNOSIS — E1122 Type 2 diabetes mellitus with diabetic chronic kidney disease: Secondary | ICD-10-CM | POA: Diagnosis not present

## 2020-10-10 DIAGNOSIS — F331 Major depressive disorder, recurrent, moderate: Secondary | ICD-10-CM | POA: Diagnosis not present

## 2020-10-10 DIAGNOSIS — I10 Essential (primary) hypertension: Secondary | ICD-10-CM | POA: Diagnosis not present

## 2020-10-10 DIAGNOSIS — F329 Major depressive disorder, single episode, unspecified: Secondary | ICD-10-CM | POA: Diagnosis not present

## 2020-10-10 DIAGNOSIS — E7800 Pure hypercholesterolemia, unspecified: Secondary | ICD-10-CM | POA: Diagnosis not present

## 2020-10-10 DIAGNOSIS — N183 Chronic kidney disease, stage 3 unspecified: Secondary | ICD-10-CM | POA: Diagnosis not present

## 2020-10-10 DIAGNOSIS — E1169 Type 2 diabetes mellitus with other specified complication: Secondary | ICD-10-CM | POA: Diagnosis not present

## 2020-10-10 DIAGNOSIS — E782 Mixed hyperlipidemia: Secondary | ICD-10-CM | POA: Diagnosis not present

## 2020-10-10 DIAGNOSIS — E039 Hypothyroidism, unspecified: Secondary | ICD-10-CM | POA: Diagnosis not present

## 2020-10-10 DIAGNOSIS — F4321 Adjustment disorder with depressed mood: Secondary | ICD-10-CM | POA: Diagnosis not present

## 2020-11-14 DIAGNOSIS — J019 Acute sinusitis, unspecified: Secondary | ICD-10-CM | POA: Diagnosis not present

## 2020-11-14 DIAGNOSIS — R051 Acute cough: Secondary | ICD-10-CM | POA: Diagnosis not present

## 2020-11-30 DIAGNOSIS — M25512 Pain in left shoulder: Secondary | ICD-10-CM | POA: Diagnosis not present

## 2020-11-30 DIAGNOSIS — M25511 Pain in right shoulder: Secondary | ICD-10-CM | POA: Diagnosis not present

## 2020-12-22 DIAGNOSIS — N183 Chronic kidney disease, stage 3 unspecified: Secondary | ICD-10-CM | POA: Diagnosis not present

## 2020-12-22 DIAGNOSIS — I2510 Atherosclerotic heart disease of native coronary artery without angina pectoris: Secondary | ICD-10-CM | POA: Diagnosis not present

## 2020-12-22 DIAGNOSIS — E782 Mixed hyperlipidemia: Secondary | ICD-10-CM | POA: Diagnosis not present

## 2020-12-22 DIAGNOSIS — E7800 Pure hypercholesterolemia, unspecified: Secondary | ICD-10-CM | POA: Diagnosis not present

## 2020-12-22 DIAGNOSIS — I10 Essential (primary) hypertension: Secondary | ICD-10-CM | POA: Diagnosis not present

## 2020-12-22 DIAGNOSIS — E039 Hypothyroidism, unspecified: Secondary | ICD-10-CM | POA: Diagnosis not present

## 2020-12-22 DIAGNOSIS — F329 Major depressive disorder, single episode, unspecified: Secondary | ICD-10-CM | POA: Diagnosis not present

## 2020-12-22 DIAGNOSIS — F331 Major depressive disorder, recurrent, moderate: Secondary | ICD-10-CM | POA: Diagnosis not present

## 2020-12-22 DIAGNOSIS — E1122 Type 2 diabetes mellitus with diabetic chronic kidney disease: Secondary | ICD-10-CM | POA: Diagnosis not present

## 2020-12-22 DIAGNOSIS — E1169 Type 2 diabetes mellitus with other specified complication: Secondary | ICD-10-CM | POA: Diagnosis not present

## 2020-12-22 DIAGNOSIS — F4321 Adjustment disorder with depressed mood: Secondary | ICD-10-CM | POA: Diagnosis not present

## 2020-12-27 ENCOUNTER — Ambulatory Visit (INDEPENDENT_AMBULATORY_CARE_PROVIDER_SITE_OTHER): Payer: Medicare Other | Admitting: Physician Assistant

## 2020-12-27 ENCOUNTER — Other Ambulatory Visit: Payer: Self-pay

## 2020-12-27 ENCOUNTER — Encounter: Payer: Self-pay | Admitting: Physician Assistant

## 2020-12-27 VITALS — BP 154/88 | HR 78 | Ht 63.0 in | Wt 189.0 lb

## 2020-12-27 DIAGNOSIS — I2510 Atherosclerotic heart disease of native coronary artery without angina pectoris: Secondary | ICD-10-CM | POA: Diagnosis not present

## 2020-12-27 DIAGNOSIS — I10 Essential (primary) hypertension: Secondary | ICD-10-CM | POA: Diagnosis not present

## 2020-12-27 DIAGNOSIS — E785 Hyperlipidemia, unspecified: Secondary | ICD-10-CM

## 2020-12-27 DIAGNOSIS — E1159 Type 2 diabetes mellitus with other circulatory complications: Secondary | ICD-10-CM | POA: Diagnosis not present

## 2020-12-27 NOTE — Patient Instructions (Signed)
Medication Instructions:  Your physician recommends that you continue on your current medications as directed. Please refer to the Current Medication list given to you today.  *If you need a refill on your cardiac medications before your next appointment, please call your pharmacy*  Lab Work: NONE ordered at this time of appointment   If you have labs (blood work) drawn today and your tests are completely normal, you will receive your results only by: Marland Kitchen MyChart Message (if you have MyChart) OR . A paper copy in the mail If you have any lab test that is abnormal or we need to change your treatment, we will call you to review the results.  Testing/Procedures: NONE ordered at this time of appointment   Follow-Up: At [facility], you and your health needs are our priority.  As part of our continuing mission to provide you with exceptional heart care, we have created designated Provider Care Teams.  These Care Teams include your primary Cardiologist (physician) and Advanced Practice Providers (APPs -  Physician Assistants and Nurse Practitioners) who all work together to provide you with the care you need, when you need it.  Your next appointment:   3 month(s)  The format for your next appointment:   In Person  Provider:   Quay Burow, MD  Other Instructions

## 2020-12-27 NOTE — Progress Notes (Signed)
Cardiology Office Note:    Date:  12/29/2020   ID:  Desiree Rasmussen, DOB 12-Mar-1941, MRN [id number]  PCP:  Seward Carol, [place]  Cardiologist:  Quay Burow, MD  Advanced Practice Provider:  No care team member to display Electrophysiologist:  None   Referring MD: Seward Carol, MD   Chief Complaint  Patient presents with  . Follow-up    Seen for Dr. Gwenlyn Found    History of Present Illness:    Desiree Rasmussen is a 80 y.o. female with a hx of HTN, HLD, DM 2 and CAD. She has a significant family history of heart disease. Myoview on 05/01/2012 showed EF 73%, no significant ischemia. She presented to the hospital on 12/10/2018 as a inferior STEMI. Emergent cardiac catheterization was performed which revealed 100% occluded proximal to mid RCA lesion with left-to-right collateral, 99% proximal LAD lesion treated with resolute Onyx 2.5 x 15 mm DES, 80% mid LAD lesion treated with resolute Onyx 2.0 x 18 mm DES, 50% mid left circumflex residual and 40% ostial D1 residual, EF 35 to 45%. RCA total occlusion was unable to be crossed with a wire and therefore managed medically. It was felt that patient likely had a completed inferior infarct prior to arrival. Echocardiogram obtained on 12/11/2018 showed EF 45 to 50%, moderate hypokinesis of the left ventricular basal mid inferior wall. Postprocedure patient was placed on aspirin and Brilinta.  Brilinta was discontinued in March 2021.  Patient presents today for follow-up.  She denies any recent chest pain.  She did complain of 2 atypical symptoms.  The first one was a flushing sensation and sweating that occurs about once a month.  Symptom occurs randomly and does not associated with exertion.  Symptom also does not associated with any palpitation or dizziness.  She did not check her blood pressure or heart rate during the episode.  The second type of symptom is a lower back pain that sometimes radiates to the abdomen  and lower chest area.  Symptom is clearly different from the previous angina symptom.  This to also occurs about once a month and sometimes even less than that.  Symptom very atypical and I recommended continue observation and reassess in 3 months.  I took a look at her recent lab work which shows LDL 92 and a triglyceride 179 in November 2021.  Ideally she should be taking full dose of 80 mg Lipitor instead of half tablet.  However patient says the full dose used to make her very sick.  We talked about the PCSK9 inhibitors, bempdoic acid or even the inclisiran. She is hesitant to try new medications this time and wished to discuss with Dr. Delfina Redwood.   Past Medical History:  Diagnosis Date  . BPPV (benign paroxysmal positional vertigo), left   . Depression   . Family history of heart disease   . GERD (gastroesophageal reflux disease)   . History of nuclear stress test 05/01/2012   dr berry   normal lexiscan nuclear study w/ no ischemia/  normal LV function and wall motion , ef 73%  . Hyperlipidemia   . Hypertension   . Hypothyroidism   . OA (osteoarthritis)   . Type 2 diabetes mellitus ([place])   . Wears contact lenses    left eye only  . Wears partial dentures    upper    Past Surgical History:  Procedure Laterality Date  . CATARACT EXTRACTION W/ INTRAOCULAR LENS  IMPLANT, BILATERAL  06/2018  . [place];  1972  . CORONARY/GRAFT ACUTE MI REVASCULARIZATION N/A 12/10/2018   Procedure: CORONARY/GRAFT ACUTE MI REVASCULARIZATION;  Surgeon: Wellington Hampshire, MD;  Location: [facility];  Service: Cardiovascular;  Laterality: N/A;  . CYSTECTOMY     sebaceous cyst on breast  . LEFT HEART CATH AND CORONARY ANGIOGRAPHY N/A 12/10/2018   Procedure: LEFT HEART CATH AND CORONARY ANGIOGRAPHY;  Surgeon: Wellington Hampshire, MD;  Location: [facility];  Service: Cardiovascular;  Laterality: N/A;  . TOTAL KNEE ARTHROPLASTY Right 06/11/2017   Procedure: RIGHT TOTAL KNEE ARTHROPLASTY;   Surgeon: Paralee Cancel, MD;  Location: [facility];  Service: Orthopedics;  Laterality: Right;  70 mins  . TOTAL KNEE ARTHROPLASTY Left 08/19/2018   Procedure: TOTAL KNEE ARTHROPLASTY;  Surgeon: Paralee Cancel, MD;  Location: [facility];  Service: Orthopedics;  Laterality: Left;    Current Medications: Current Meds  Medication Sig  . acetaminophen (TYLENOL) 500 MG tablet Take 500-1,000 mg by mouth every 8 (eight) hours as needed for mild pain, moderate pain or headache.  Marland Kitchen aspirin 81 MG chewable tablet aspirin 81 mg chewable tablet  Chew 1 tablet every day by oral route.  Marland Kitchen atorvastatin (LIPITOR) 80 MG tablet 1/2 tablet  . carvedilol (COREG) 6.25 MG tablet TAKE 1 TABLET BY MOUTH TWICE A DAY WITH MEALS  . clorazepate (TRANXENE) 7.5 MG tablet Take 7.5 mg by mouth See admin instructions. Take 7.5 mg by mouth at bedtime and an additional 7.5 mg once a day as needed for anxiousness  . Cyanocobalamin (VITAMIN B12) 1000 MCG TBCR 1 tablet  . fluticasone (FLONASE) 50 MCG/ACT nasal spray Place 2 sprays into both nostrils daily as needed for allergies or rhinitis.  . furosemide (LASIX) 20 MG tablet furosemide 20 mg tablet  . glimepiride (AMARYL) 2 MG tablet glimepiride 2 mg tablet  . lisinopril (ZESTRIL) 40 MG tablet Take 40 mg by mouth daily.  Marland Kitchen liver oil-zinc oxide (DESITIN) 40 % ointment Apply 1 application topically as needed for irritation (UNDER THE BREASTS AND BETWEEN SKIN FOLDS).   Marland Kitchen loratadine (CLARITIN) 10 MG tablet Take 10 mg by mouth daily as needed for allergies or rhinitis.  . metFORMIN (GLUCOPHAGE-XR) 500 MG 24 hr tablet Take 500 mg by mouth at bedtime.  . Multiple Vitamins-Minerals (MULTIVITAMIN WITH MINERALS) tablet Take 1 tablet by mouth 3 (three) times a week.   . nitroGLYCERIN (NITROSTAT) 0.4 MG SL tablet PLACE 1 TABLET UNDER TONGUE EVERY 5 MINUTES AS NEEDED FOR CHEST PAIN  . SYNTHROID 100 MCG tablet Take 100 mcg by mouth daily before breakfast.   . vitamin C (ASCORBIC ACID) 500 MG tablet  Take 500-1,000 mg by mouth daily.  . Zinc 50 MG TABS 1 tablet  . [DISCONTINUED] atorvastatin (LIPITOR) 80 MG tablet TAKE 1 TABLET BY MOUTH EVERY DAY AT 6PM (Patient taking differently: Take 40 mg by mouth daily.)  . [DISCONTINUED] lisinopril (ZESTRIL) 20 MG tablet TAKE 1 TABLET BY MOUTH EVERY DAY (Patient taking differently: Take 40 mg by mouth daily.)     Allergies:   Lexapro [escitalopram oxalate], Doxycycline, Erythromycin, Erythromycin base, Hydrocodone, Macrobid [nitrofurantoin], Sulfa antibiotics, and Zoloft [sertraline hcl]   Social History   Socioeconomic History  . Marital status: Widowed    Spouse name: Not on file  . Number of children: 2  . Years of education: Not on file  . Highest education level: Not on file  Occupational History  . Not on file  Tobacco Use  .  Smoking status: Never Smoker  . Smokeless tobacco: Never Used  Vaping Use  . Vaping Use: Never used  Substance and Sexual Activity  . Alcohol use: No  . Drug use: Never  . Sexual activity: Not on file  Other Topics Concern  . Not on file  Social History Narrative  . Not on file   Social Determinants of Health   Financial Resource Strain: Not on file  Food Insecurity: Not on file  Transportation Needs: Not on file  Physical Activity: Not on file  Stress: Not on file  Social Connections: Not on file     Family History: The patient's family history includes Breast cancer (age of onset: 46) in her maternal aunt; Colon cancer in her maternal uncle; Colon cancer (age of onset: 20) in her mother; Heart disease in her brother; Heart disease (age of onset: 62) in her father.  ROS:   Please see the history of present illness.     All other systems reviewed and are negative.  EKGs/Labs/Other Studies Reviewed:    The following studies were reviewed today:  Cath 12/10/2018  Prox RCA to Mid RCA lesion is 100% stenosed.  Prox LAD lesion is 99% stenosed.  Post intervention, there is a 0% residual  stenosis.  A drug-eluting stent was successfully placed using a STENT RESOLUTE ONYX 2.5X15.  Ost 1st Diag lesion is 40% stenosed.  Mid LAD lesion is 80% stenosed.  Post intervention, there is a 5% residual stenosis.  A drug-eluting stent was successfully placed using a STENT RESOLUTE ONYX 2.0X18.  Mid Cx lesion is 50% stenosed.  LV end diastolic pressure is mildly elevated.  The left ventricular ejection fraction is 35-45% by visual estimate.  There is moderate left ventricular systolic dysfunction.   1.  Severe two-vessel coronary artery disease.  The patient presented with inferior ST elevation myocardial infarction with symptoms that started about 2 weeks prior to presentation.  The culprit for her STEMI is likely an acute on chronic occlusion in the RCA.  There is also high-grade stenosis in the proximal LAD and mid LAD.  Moderate disease in the left circumflex. 2.  Moderately reduced LV systolic function with an EF of 35 to 40%.  Mildly elevated left ventricular end-diastolic pressure. 3. Inability to cross the occlusion in the right coronary artery. 4. Successful angioplasty and drug-eluting stent placement to the LAD x2.  Recommendations: Dual antiplatelet therapy for at least 1 year. The patient will likely finish an inferior infarct but she has reasonable left-to-right collaterals. The patient's chest pain improved at the end of the procedure but as expected, did not resolve and thus I elected to start her on nitroglycerin drip.  She also continued to have inferior ST elevation in her EKG and this is also expected.   Echo 12/11/2018 1. Moderate hypokinesis of the left ventricular, basal-mid inferior wall.  2. The left ventricle has mildly reduced systolic function, with an  ejection fraction of 45-50%. The cavity size was normal. There is mildly increased left ventricular wall thickness. Left ventricular diastolic Doppler parameters are consistent with  impaired  relaxation.  3. The right ventricle has normal systolic function. The cavity was normal. There is no increase in right ventricular wall thickness.  4. The aortic valve is tricuspid Moderate thickening of the aortic valve Moderate calcification of the aortic valve.    EKG:  EKG is ordered today.  The ekg ordered today demonstrates normal sinus rhythm, no significant ST-T wave changes.  Recent Labs:  No results found for requested labs within last 8760 hours.  Recent Lipid Panel    Component Value Date/Time   CHOL 175 12/10/2018 1500   TRIG 276 (H) 12/10/2018 1500   HDL 43 12/10/2018 1500   CHOLHDL 4.1 12/10/2018 1500   VLDL 55 (H) 12/10/2018 1500   LDLCALC 77 12/10/2018 1500     Risk Assessment/Calculations:       Physical Exam:    VS:  BP (!) 154/88   Pulse 78   Ht 5\' 3"  (1.6 m)   Wt 189 lb (85.7 kg)   SpO2 98%   BMI 33.48 kg/m     Wt Readings from Last 3 Encounters:  12/27/20 189 lb (85.7 kg)  06/21/20 192 lb (87.1 kg)  12/17/19 177 lb (80.3 kg)     GEN:  Well nourished, well developed in no acute distress HEENT: Normal NECK: No JVD; No carotid bruits LYMPHATICS: No lymphadenopathy CARDIAC: RRR, no murmurs, rubs, gallops RESPIRATORY:  Clear to auscultation without rales, wheezing or rhonchi  ABDOMEN: Soft, non-tender, non-distended MUSCULOSKELETAL:  No edema; No deformity  SKIN: Warm and dry NEUROLOGIC:  Alert and oriented x 3 PSYCHIATRIC:  Normal affect   ASSESSMENT:    1. Coronary artery disease involving native coronary artery of native heart without angina pectoris   2. Primary hypertension   3. Hyperlipidemia LDL goal <70   4. Controlled type 2 diabetes mellitus with other circulatory complication, without long-term current use of insulin (HCC)    PLAN:    In order of problems listed above:  1. CAD: Continue aspirin and Lipitor.  No further chest pain.  She has been having episodic flushing sensation, symptom is quite atypical, I recommended  continue observation.  2. Hypertension: Blood pressure stable  3. Hyperlipidemia: On Lipitor  4. DM2: Managed by primary care provider.        Medication Adjustments/Labs and Tests Ordered: Current medicines are reviewed at length with the patient today.  Concerns regarding medicines are outlined above.  Orders Placed This Encounter  Procedures  . EKG 12-Lead   No orders of the defined types were placed in this encounter.   Patient Instructions  Medication Instructions:  Your physician recommends that you continue on your current medications as directed. Please refer to the Current Medication list given to you today.  *If you need a refill on your cardiac medications before your next appointment, please call your pharmacy*  Lab Work: NONE ordered at this time of appointment   If you have labs (blood work) drawn today and your tests are completely normal, you will receive your results only by: Marland Kitchen MyChart Message (if you have MyChart) OR . A paper copy in the mail If you have any lab test that is abnormal or we need to change your treatment, we will call you to review the results.  Testing/Procedures: NONE ordered at this time of appointment   Follow-Up: At [facility], you and your health needs are our priority.  As part of our continuing mission to provide you with exceptional heart care, we have created designated Provider Care Teams.  These Care Teams include your primary Cardiologist (physician) and Advanced Practice Providers (APPs -  Physician Assistants and Nurse Practitioners) who all work together to provide you with the care you need, when you need it.  Your next appointment:   3 month(s)  The format for your next appointment:   In Person  Provider:   Quay Burow, MD  Other Instructions  Hilbert Corrigan, [place]  12/29/2020 12:12 PM    [place]

## 2020-12-29 ENCOUNTER — Encounter: Payer: Self-pay | Admitting: Physician Assistant

## 2021-01-23 DIAGNOSIS — J069 Acute upper respiratory infection, unspecified: Secondary | ICD-10-CM | POA: Diagnosis not present

## 2021-01-23 DIAGNOSIS — R051 Acute cough: Secondary | ICD-10-CM | POA: Diagnosis not present

## 2021-02-01 DIAGNOSIS — M545 Low back pain, unspecified: Secondary | ICD-10-CM | POA: Diagnosis not present

## 2021-02-01 DIAGNOSIS — M5459 Other low back pain: Secondary | ICD-10-CM | POA: Diagnosis not present

## 2021-02-08 DIAGNOSIS — M5136 Other intervertebral disc degeneration, lumbar region: Secondary | ICD-10-CM | POA: Diagnosis not present

## 2021-02-15 DIAGNOSIS — L570 Actinic keratosis: Secondary | ICD-10-CM | POA: Diagnosis not present

## 2021-02-15 DIAGNOSIS — D692 Other nonthrombocytopenic purpura: Secondary | ICD-10-CM | POA: Diagnosis not present

## 2021-02-15 DIAGNOSIS — L820 Inflamed seborrheic keratosis: Secondary | ICD-10-CM | POA: Diagnosis not present

## 2021-02-15 DIAGNOSIS — D0471 Carcinoma in situ of skin of right lower limb, including hip: Secondary | ICD-10-CM | POA: Diagnosis not present

## 2021-02-15 DIAGNOSIS — C44729 Squamous cell carcinoma of skin of left lower limb, including hip: Secondary | ICD-10-CM | POA: Diagnosis not present

## 2021-02-15 DIAGNOSIS — L821 Other seborrheic keratosis: Secondary | ICD-10-CM | POA: Diagnosis not present

## 2021-02-15 DIAGNOSIS — Z85828 Personal history of other malignant neoplasm of skin: Secondary | ICD-10-CM | POA: Diagnosis not present

## 2021-02-15 DIAGNOSIS — D225 Melanocytic nevi of trunk: Secondary | ICD-10-CM | POA: Diagnosis not present

## 2021-02-15 DIAGNOSIS — L814 Other melanin hyperpigmentation: Secondary | ICD-10-CM | POA: Diagnosis not present

## 2021-02-21 DIAGNOSIS — N183 Chronic kidney disease, stage 3 unspecified: Secondary | ICD-10-CM | POA: Diagnosis not present

## 2021-02-21 DIAGNOSIS — F329 Major depressive disorder, single episode, unspecified: Secondary | ICD-10-CM | POA: Diagnosis not present

## 2021-02-21 DIAGNOSIS — F4321 Adjustment disorder with depressed mood: Secondary | ICD-10-CM | POA: Diagnosis not present

## 2021-02-21 DIAGNOSIS — E782 Mixed hyperlipidemia: Secondary | ICD-10-CM | POA: Diagnosis not present

## 2021-02-21 DIAGNOSIS — E7800 Pure hypercholesterolemia, unspecified: Secondary | ICD-10-CM | POA: Diagnosis not present

## 2021-02-21 DIAGNOSIS — I2510 Atherosclerotic heart disease of native coronary artery without angina pectoris: Secondary | ICD-10-CM | POA: Diagnosis not present

## 2021-02-21 DIAGNOSIS — E039 Hypothyroidism, unspecified: Secondary | ICD-10-CM | POA: Diagnosis not present

## 2021-02-21 DIAGNOSIS — F331 Major depressive disorder, recurrent, moderate: Secondary | ICD-10-CM | POA: Diagnosis not present

## 2021-02-21 DIAGNOSIS — I10 Essential (primary) hypertension: Secondary | ICD-10-CM | POA: Diagnosis not present

## 2021-02-21 DIAGNOSIS — M47816 Spondylosis without myelopathy or radiculopathy, lumbar region: Secondary | ICD-10-CM | POA: Diagnosis not present

## 2021-02-21 DIAGNOSIS — E1169 Type 2 diabetes mellitus with other specified complication: Secondary | ICD-10-CM | POA: Diagnosis not present

## 2021-02-21 DIAGNOSIS — E1122 Type 2 diabetes mellitus with diabetic chronic kidney disease: Secondary | ICD-10-CM | POA: Diagnosis not present

## 2021-03-10 DIAGNOSIS — E7800 Pure hypercholesterolemia, unspecified: Secondary | ICD-10-CM | POA: Diagnosis not present

## 2021-03-10 DIAGNOSIS — I10 Essential (primary) hypertension: Secondary | ICD-10-CM | POA: Diagnosis not present

## 2021-03-10 DIAGNOSIS — M8588 Other specified disorders of bone density and structure, other site: Secondary | ICD-10-CM | POA: Diagnosis not present

## 2021-03-10 DIAGNOSIS — Z7984 Long term (current) use of oral hypoglycemic drugs: Secondary | ICD-10-CM | POA: Diagnosis not present

## 2021-03-10 DIAGNOSIS — M5442 Lumbago with sciatica, left side: Secondary | ICD-10-CM | POA: Diagnosis not present

## 2021-03-10 DIAGNOSIS — E1169 Type 2 diabetes mellitus with other specified complication: Secondary | ICD-10-CM | POA: Diagnosis not present

## 2021-03-10 DIAGNOSIS — S90561A Insect bite (nonvenomous), right ankle, initial encounter: Secondary | ICD-10-CM | POA: Diagnosis not present

## 2021-03-10 DIAGNOSIS — I2510 Atherosclerotic heart disease of native coronary artery without angina pectoris: Secondary | ICD-10-CM | POA: Diagnosis not present

## 2021-03-10 DIAGNOSIS — G8929 Other chronic pain: Secondary | ICD-10-CM | POA: Diagnosis not present

## 2021-03-13 ENCOUNTER — Other Ambulatory Visit: Payer: Self-pay | Admitting: Physician Assistant

## 2021-03-14 DIAGNOSIS — M47896 Other spondylosis, lumbar region: Secondary | ICD-10-CM | POA: Diagnosis not present

## 2021-03-14 DIAGNOSIS — M47816 Spondylosis without myelopathy or radiculopathy, lumbar region: Secondary | ICD-10-CM | POA: Diagnosis not present

## 2021-03-16 DIAGNOSIS — H1033 Unspecified acute conjunctivitis, bilateral: Secondary | ICD-10-CM | POA: Diagnosis not present

## 2021-03-16 DIAGNOSIS — Z9849 Cataract extraction status, unspecified eye: Secondary | ICD-10-CM | POA: Diagnosis not present

## 2021-03-16 DIAGNOSIS — I10 Essential (primary) hypertension: Secondary | ICD-10-CM | POA: Diagnosis not present

## 2021-03-16 DIAGNOSIS — H5211 Myopia, right eye: Secondary | ICD-10-CM | POA: Diagnosis not present

## 2021-03-16 DIAGNOSIS — H524 Presbyopia: Secondary | ICD-10-CM | POA: Diagnosis not present

## 2021-03-16 DIAGNOSIS — Z961 Presence of intraocular lens: Secondary | ICD-10-CM | POA: Diagnosis not present

## 2021-03-16 DIAGNOSIS — H5202 Hypermetropia, left eye: Secondary | ICD-10-CM | POA: Diagnosis not present

## 2021-03-16 DIAGNOSIS — E119 Type 2 diabetes mellitus without complications: Secondary | ICD-10-CM | POA: Diagnosis not present

## 2021-03-16 DIAGNOSIS — H35033 Hypertensive retinopathy, bilateral: Secondary | ICD-10-CM | POA: Diagnosis not present

## 2021-03-16 DIAGNOSIS — H52223 Regular astigmatism, bilateral: Secondary | ICD-10-CM | POA: Diagnosis not present

## 2021-03-29 ENCOUNTER — Other Ambulatory Visit: Payer: Self-pay

## 2021-03-29 ENCOUNTER — Encounter: Payer: Self-pay | Admitting: Cardiovascular Disease

## 2021-03-29 ENCOUNTER — Ambulatory Visit (INDEPENDENT_AMBULATORY_CARE_PROVIDER_SITE_OTHER): Payer: Medicare Other | Admitting: Cardiovascular Disease

## 2021-03-29 DIAGNOSIS — I2510 Atherosclerotic heart disease of native coronary artery without angina pectoris: Secondary | ICD-10-CM | POA: Diagnosis not present

## 2021-03-29 DIAGNOSIS — I2119 ST elevation (STEMI) myocardial infarction involving other coronary artery of inferior wall: Secondary | ICD-10-CM | POA: Diagnosis not present

## 2021-03-29 DIAGNOSIS — E782 Mixed hyperlipidemia: Secondary | ICD-10-CM | POA: Diagnosis not present

## 2021-03-29 DIAGNOSIS — I10 Essential (primary) hypertension: Secondary | ICD-10-CM

## 2021-03-29 NOTE — Assessment & Plan Note (Signed)
History of hyperlipidemia on statin therapy with lipid profile performed 03/10/2021 revealing total cholesterol 159, LDL 78 and HDL 43.

## 2021-03-29 NOTE — Patient Instructions (Addendum)
Medication Instructions:  °Your physician recommends that you continue on your current medications as directed. Please refer to the Current Medication list given to you today. ° °*If you need a refill on your cardiac medications before your next appointment, please call your pharmacy* ° °Lab Work: °NONE ordered at this time of appointment  ° °If you have labs (blood work) drawn today and your tests are completely normal, you will receive your results only by: °MyChart Message (if you have MyChart) OR °A paper copy in the mail °If you have any lab test that is abnormal or we need to change your treatment, we will call you to review the results. ° °Testing/Procedures: °NONE ordered at this time of appointment  ° °Follow-Up: °At [facility], you and your health needs are our priority.  As part of our continuing mission to provide you with exceptional heart care, we have created designated Provider Care Teams.  These Care Teams include your primary Cardiologist (physician) and Advanced Practice Providers (APPs -  Physician Assistants and Nurse Practitioners) who all work together to provide you with the care you need, when you need it. ° °Your next appointment:   °1 year(s) ° °The format for your next appointment:   °In Person ° °Provider:   °Jonathan Berry, MD   ° °Other Instructions ° ° °

## 2021-03-29 NOTE — Assessment & Plan Note (Signed)
History of essential hypertension blood pressure measured today 139/73.  She is on carvedilol and lisinopril.

## 2021-03-29 NOTE — Progress Notes (Signed)
03/29/2021 Desiree Rasmussen   [birth year]  [id number]  Primary Physician Seward Carol, MD Primary Cardiologist: Lorretta Harp MD [name], [place]  HPI:  Desiree Rasmussen is a 80 y.o.    moderately overweight, married Caucasian female, mother of two, grandmother to two grandchildren, whose husband , Tess Potts, unfortunately passed away on Dec 07, 2013. He was a patient of mine as well. He apparently died of complications of pneumonia.  I last saw her in the office 01/31/2021.  She has a history of hypertension and hyperlipidemia, as well as a family history of heart disease. She had chest pain and ruled out myocardial infarction on an ER visit earlier last year, and when I saw her she was complaining of atypical chest pain. A Myoview stress test performed in our office on May 01, 2012, was entirely normal. I thought her pain was more musculoskeletal and/or radicular and her symptoms subsequently resolved. Since I saw her a year ago she's remained fairly stable and is asymptomatic. She apparently needs a right total knee replacement by Dr. Alvan Dame 06/11/17.   She presented with an inferior STEMI 12/10/2018 and had urgent cath by Dr. Fletcher Anon via the right radial approach revealing an occluded RCA which was deemed to be an CTO with left-to-right collaterals, high-grade mid and distal LAD stenoses which were both stented with drug-eluting stents and a 50% mid AV groove circumflex stenosis.  Her EF at the time of cath was 35 to 45% and subsequent echo 45 to 50%.  She remains on aspirin and Brilinta was discontinued.   Since I saw her a year ago she has been stable.  She denies chest pain or shortness of breath.   Current Meds  Medication Sig   acetaminophen (TYLENOL) 500 MG tablet Take 500-1,000 mg by mouth every 8 (eight) hours as needed for mild pain, moderate pain or headache.   aspirin 81 MG chewable tablet aspirin 81 mg chewable tablet  Chew 1 tablet every day by oral route.   atorvastatin  (LIPITOR) 80 MG tablet 1/2 tablet   carvedilol (COREG) 6.25 MG tablet TAKE 1 TABLET BY MOUTH TWICE A DAY WITH MEALS   clorazepate (TRANXENE) 7.5 MG tablet Take 7.5 mg by mouth See admin instructions. Take 7.5 mg by mouth at bedtime and an additional 7.5 mg once a day as needed for anxiousness   Cyanocobalamin (VITAMIN B12) 1000 MCG TBCR 1 tablet   fluticasone (FLONASE) 50 MCG/ACT nasal spray Place 2 sprays into both nostrils daily as needed for allergies or rhinitis.   furosemide (LASIX) 20 MG tablet furosemide 20 mg tablet   glimepiride (AMARYL) 2 MG tablet glimepiride 2 mg tablet   lisinopril (ZESTRIL) 40 MG tablet Take 40 mg by mouth daily.   liver oil-zinc oxide (DESITIN) 40 % ointment Apply 1 application topically as needed for irritation (UNDER THE BREASTS AND BETWEEN SKIN FOLDS).    loratadine (CLARITIN) 10 MG tablet Take 10 mg by mouth daily as needed for allergies or rhinitis.   metFORMIN (GLUCOPHAGE-XR) 500 MG 24 hr tablet Take 500 mg by mouth at bedtime.   Multiple Vitamins-Minerals (MULTIVITAMIN WITH MINERALS) tablet Take 1 tablet by mouth 3 (three) times a week.    nitroGLYCERIN (NITROSTAT) 0.4 MG SL tablet PLACE 1 TABLET UNDER TONGUE EVERY 5 MINUTES AS NEEDED FOR CHEST PAIN   SYNTHROID 100 MCG tablet Take 100 mcg by mouth daily before breakfast.    vitamin C (ASCORBIC ACID) 500 MG tablet Take 500-1,000 mg  by mouth daily.   Zinc 50 MG TABS 1 tablet     Allergies  Allergen Reactions   Lexapro [Escitalopram Oxalate] Diarrhea   Doxycycline Nausea And Vomiting    Upset stomach, also   Erythromycin Other (See Comments)    "upset stomach"   Erythromycin Base Nausea Only   Hydrocodone Other (See Comments)    Affected mood negatively 500 Mg    Macrobid [Nitrofurantoin] Other (See Comments)    Didn't work   Sulfa Antibiotics Rash   Zoloft [Sertraline Hcl] Palpitations    Social History   Socioeconomic History   Marital status: Widowed    Spouse name: Not on file   Number of  children: 2   Years of education: Not on file   Highest education level: Not on file  Occupational History   Not on file  Tobacco Use   Smoking status: Never   Smokeless tobacco: Never  Vaping Use   Vaping Use: Never used  Substance and Sexual Activity   Alcohol use: No   Drug use: Never   Sexual activity: Not on file  Other Topics Concern   Not on file  Social History Narrative   Not on file   Social Determinants of Health   Financial Resource Strain: Not on file  Food Insecurity: Not on file  Transportation Needs: Not on file  Physical Activity: Not on file  Stress: Not on file  Social Connections: Not on file  Intimate Partner Violence: Not on file     Review of Systems: General: negative for chills, fever, night sweats or weight changes.  Cardiovascular: negative for chest pain, dyspnea on exertion, edema, orthopnea, palpitations, paroxysmal nocturnal dyspnea or shortness of breath Dermatological: negative for rash Respiratory: negative for cough or wheezing Urologic: negative for hematuria Abdominal: negative for nausea, vomiting, diarrhea, bright red blood per rectum, melena, or hematemesis Neurologic: negative for visual changes, syncope, or dizziness All other systems reviewed and are otherwise negative except as noted above.    Blood pressure 139/73, pulse 79, height 5\' 4"  (1.626 m), weight 192 lb 12.8 oz (87.5 kg), SpO2 97 %.  General appearance: alert and no distress Neck: no adenopathy, no carotid bruit, no JVD, supple, symmetrical, trachea midline, and thyroid not enlarged, symmetric, no tenderness/mass/nodules Lungs: clear to auscultation bilaterally Heart: regular rate and rhythm, S1, S2 normal, no murmur, click, rub or gallop Extremities: extremities normal, atraumatic, no cyanosis or edema Pulses: 2+ and symmetric Skin: Skin color, texture, turgor normal. No rashes or lesions Neurologic: Grossly normal  EKG not performed today  ASSESSMENT AND  PLAN:   Essential hypertension History of essential hypertension blood pressure measured today 139/73.  She is on carvedilol and lisinopril.  Hyperlipidemia History of hyperlipidemia on statin therapy with lipid profile performed 03/10/2021 revealing total cholesterol 159, LDL 78 and HDL 43.  Acute ST elevation myocardial infarction (STEMI) of inferior wall [facility]) History of inferior STEMI 12/10/2018 with urgent cath performed by Dr. Fletcher Anon via the right radial approach revealing occluded RCA which was deemed to be a CTO and high-grade mid and distal LAD stenoses both of which were stented.  She did have a 50% AV groove circumflex stenosis.  EF initially the time of cath was 35 to 45% which subsequently increased to 45 to 50%.  She was on aspirin and Brilinta for 6 months and Brilinta was ultimately discontinued.  She denies chest pain or shortness of breath.     Lorretta Harp MD [place], [facility] 03/29/2021 10:48  AM

## 2021-03-29 NOTE — Assessment & Plan Note (Signed)
History of inferior STEMI 12/10/2018 with urgent cath performed by Dr. Fletcher Anon via the right radial approach revealing occluded RCA which was deemed to be a CTO and high-grade mid and distal LAD stenoses both of which were stented.  She did have a 50% AV groove circumflex stenosis.  EF initially the time of cath was 35 to 45% which subsequently increased to 45 to 50%.  She was on aspirin and Brilinta for 6 months and Brilinta was ultimately discontinued.  She denies chest pain or shortness of breath.

## 2021-04-10 DIAGNOSIS — I2510 Atherosclerotic heart disease of native coronary artery without angina pectoris: Secondary | ICD-10-CM | POA: Diagnosis not present

## 2021-04-10 DIAGNOSIS — F331 Major depressive disorder, recurrent, moderate: Secondary | ICD-10-CM | POA: Diagnosis not present

## 2021-04-10 DIAGNOSIS — E1169 Type 2 diabetes mellitus with other specified complication: Secondary | ICD-10-CM | POA: Diagnosis not present

## 2021-04-10 DIAGNOSIS — N183 Chronic kidney disease, stage 3 unspecified: Secondary | ICD-10-CM | POA: Diagnosis not present

## 2021-04-10 DIAGNOSIS — F4321 Adjustment disorder with depressed mood: Secondary | ICD-10-CM | POA: Diagnosis not present

## 2021-04-10 DIAGNOSIS — E1122 Type 2 diabetes mellitus with diabetic chronic kidney disease: Secondary | ICD-10-CM | POA: Diagnosis not present

## 2021-04-10 DIAGNOSIS — E039 Hypothyroidism, unspecified: Secondary | ICD-10-CM | POA: Diagnosis not present

## 2021-04-10 DIAGNOSIS — I10 Essential (primary) hypertension: Secondary | ICD-10-CM | POA: Diagnosis not present

## 2021-04-10 DIAGNOSIS — G8929 Other chronic pain: Secondary | ICD-10-CM | POA: Diagnosis not present

## 2021-04-10 DIAGNOSIS — E782 Mixed hyperlipidemia: Secondary | ICD-10-CM | POA: Diagnosis not present

## 2021-04-20 DIAGNOSIS — N183 Chronic kidney disease, stage 3 unspecified: Secondary | ICD-10-CM | POA: Diagnosis not present

## 2021-04-20 DIAGNOSIS — M5136 Other intervertebral disc degeneration, lumbar region: Secondary | ICD-10-CM | POA: Diagnosis not present

## 2021-04-26 DIAGNOSIS — M25512 Pain in left shoulder: Secondary | ICD-10-CM | POA: Diagnosis not present

## 2021-04-26 DIAGNOSIS — M25511 Pain in right shoulder: Secondary | ICD-10-CM | POA: Diagnosis not present

## 2021-05-12 DIAGNOSIS — M79675 Pain in left toe(s): Secondary | ICD-10-CM | POA: Diagnosis not present

## 2021-05-22 DIAGNOSIS — Z79891 Long term (current) use of opiate analgesic: Secondary | ICD-10-CM | POA: Diagnosis not present

## 2021-05-22 DIAGNOSIS — M5136 Other intervertebral disc degeneration, lumbar region: Secondary | ICD-10-CM | POA: Diagnosis not present

## 2021-06-15 DIAGNOSIS — I10 Essential (primary) hypertension: Secondary | ICD-10-CM | POA: Diagnosis not present

## 2021-06-15 DIAGNOSIS — E7800 Pure hypercholesterolemia, unspecified: Secondary | ICD-10-CM | POA: Diagnosis not present

## 2021-06-15 DIAGNOSIS — F4321 Adjustment disorder with depressed mood: Secondary | ICD-10-CM | POA: Diagnosis not present

## 2021-06-15 DIAGNOSIS — E782 Mixed hyperlipidemia: Secondary | ICD-10-CM | POA: Diagnosis not present

## 2021-06-15 DIAGNOSIS — E1122 Type 2 diabetes mellitus with diabetic chronic kidney disease: Secondary | ICD-10-CM | POA: Diagnosis not present

## 2021-06-15 DIAGNOSIS — I2510 Atherosclerotic heart disease of native coronary artery without angina pectoris: Secondary | ICD-10-CM | POA: Diagnosis not present

## 2021-06-15 DIAGNOSIS — G8929 Other chronic pain: Secondary | ICD-10-CM | POA: Diagnosis not present

## 2021-06-15 DIAGNOSIS — N183 Chronic kidney disease, stage 3 unspecified: Secondary | ICD-10-CM | POA: Diagnosis not present

## 2021-06-15 DIAGNOSIS — E039 Hypothyroidism, unspecified: Secondary | ICD-10-CM | POA: Diagnosis not present

## 2021-06-15 DIAGNOSIS — E1169 Type 2 diabetes mellitus with other specified complication: Secondary | ICD-10-CM | POA: Diagnosis not present

## 2021-07-12 DIAGNOSIS — Z23 Encounter for immunization: Secondary | ICD-10-CM | POA: Diagnosis not present

## 2021-07-24 DIAGNOSIS — J0110 Acute frontal sinusitis, unspecified: Secondary | ICD-10-CM | POA: Diagnosis not present

## 2021-07-24 DIAGNOSIS — I10 Essential (primary) hypertension: Secondary | ICD-10-CM | POA: Diagnosis not present

## 2021-07-31 DIAGNOSIS — I10 Essential (primary) hypertension: Secondary | ICD-10-CM | POA: Diagnosis not present

## 2021-08-10 DIAGNOSIS — N183 Chronic kidney disease, stage 3 unspecified: Secondary | ICD-10-CM | POA: Diagnosis not present

## 2021-08-10 DIAGNOSIS — E039 Hypothyroidism, unspecified: Secondary | ICD-10-CM | POA: Diagnosis not present

## 2021-08-10 DIAGNOSIS — F4321 Adjustment disorder with depressed mood: Secondary | ICD-10-CM | POA: Diagnosis not present

## 2021-08-10 DIAGNOSIS — E7800 Pure hypercholesterolemia, unspecified: Secondary | ICD-10-CM | POA: Diagnosis not present

## 2021-08-10 DIAGNOSIS — F331 Major depressive disorder, recurrent, moderate: Secondary | ICD-10-CM | POA: Diagnosis not present

## 2021-08-10 DIAGNOSIS — E1122 Type 2 diabetes mellitus with diabetic chronic kidney disease: Secondary | ICD-10-CM | POA: Diagnosis not present

## 2021-08-10 DIAGNOSIS — E1169 Type 2 diabetes mellitus with other specified complication: Secondary | ICD-10-CM | POA: Diagnosis not present

## 2021-08-10 DIAGNOSIS — E782 Mixed hyperlipidemia: Secondary | ICD-10-CM | POA: Diagnosis not present

## 2021-08-10 DIAGNOSIS — I10 Essential (primary) hypertension: Secondary | ICD-10-CM | POA: Diagnosis not present

## 2021-08-10 DIAGNOSIS — G8929 Other chronic pain: Secondary | ICD-10-CM | POA: Diagnosis not present

## 2021-08-21 DIAGNOSIS — M5136 Other intervertebral disc degeneration, lumbar region: Secondary | ICD-10-CM | POA: Diagnosis not present

## 2021-08-21 DIAGNOSIS — M4310 Spondylolisthesis, site unspecified: Secondary | ICD-10-CM | POA: Diagnosis not present

## 2021-08-21 DIAGNOSIS — N183 Chronic kidney disease, stage 3 unspecified: Secondary | ICD-10-CM | POA: Diagnosis not present

## 2021-09-01 DIAGNOSIS — M5442 Lumbago with sciatica, left side: Secondary | ICD-10-CM | POA: Diagnosis not present

## 2021-09-01 DIAGNOSIS — E039 Hypothyroidism, unspecified: Secondary | ICD-10-CM | POA: Diagnosis not present

## 2021-09-01 DIAGNOSIS — I10 Essential (primary) hypertension: Secondary | ICD-10-CM | POA: Diagnosis not present

## 2021-09-01 DIAGNOSIS — E7800 Pure hypercholesterolemia, unspecified: Secondary | ICD-10-CM | POA: Diagnosis not present

## 2021-09-01 DIAGNOSIS — Z1389 Encounter for screening for other disorder: Secondary | ICD-10-CM | POA: Diagnosis not present

## 2021-09-01 DIAGNOSIS — E1169 Type 2 diabetes mellitus with other specified complication: Secondary | ICD-10-CM | POA: Diagnosis not present

## 2021-09-01 DIAGNOSIS — Z7984 Long term (current) use of oral hypoglycemic drugs: Secondary | ICD-10-CM | POA: Diagnosis not present

## 2021-09-01 DIAGNOSIS — Z0000 Encounter for general adult medical examination without abnormal findings: Secondary | ICD-10-CM | POA: Diagnosis not present

## 2021-09-01 DIAGNOSIS — I2510 Atherosclerotic heart disease of native coronary artery without angina pectoris: Secondary | ICD-10-CM | POA: Diagnosis not present

## 2021-09-01 DIAGNOSIS — E1122 Type 2 diabetes mellitus with diabetic chronic kidney disease: Secondary | ICD-10-CM | POA: Diagnosis not present

## 2021-09-01 DIAGNOSIS — E8770 Fluid overload, unspecified: Secondary | ICD-10-CM | POA: Diagnosis not present

## 2021-09-06 DIAGNOSIS — M25511 Pain in right shoulder: Secondary | ICD-10-CM | POA: Diagnosis not present

## 2021-09-06 DIAGNOSIS — M25512 Pain in left shoulder: Secondary | ICD-10-CM | POA: Diagnosis not present

## 2021-09-28 DIAGNOSIS — Z1231 Encounter for screening mammogram for malignant neoplasm of breast: Secondary | ICD-10-CM | POA: Diagnosis not present

## 2021-10-09 ENCOUNTER — Other Ambulatory Visit: Payer: Self-pay | Admitting: Cardiovascular Disease

## 2021-10-13 DIAGNOSIS — E1169 Type 2 diabetes mellitus with other specified complication: Secondary | ICD-10-CM | POA: Diagnosis not present

## 2021-10-13 DIAGNOSIS — Z7984 Long term (current) use of oral hypoglycemic drugs: Secondary | ICD-10-CM | POA: Diagnosis not present

## 2021-10-13 DIAGNOSIS — I10 Essential (primary) hypertension: Secondary | ICD-10-CM | POA: Diagnosis not present

## 2021-10-13 DIAGNOSIS — R0789 Other chest pain: Secondary | ICD-10-CM | POA: Diagnosis not present

## 2021-10-13 DIAGNOSIS — R600 Localized edema: Secondary | ICD-10-CM | POA: Diagnosis not present

## 2021-11-14 DIAGNOSIS — R110 Nausea: Secondary | ICD-10-CM | POA: Diagnosis not present

## 2021-11-14 DIAGNOSIS — I10 Essential (primary) hypertension: Secondary | ICD-10-CM | POA: Diagnosis not present

## 2021-11-21 DIAGNOSIS — R110 Nausea: Secondary | ICD-10-CM | POA: Diagnosis not present

## 2021-11-21 DIAGNOSIS — I10 Essential (primary) hypertension: Secondary | ICD-10-CM | POA: Diagnosis not present

## 2021-11-28 DIAGNOSIS — G8929 Other chronic pain: Secondary | ICD-10-CM | POA: Diagnosis not present

## 2021-11-28 DIAGNOSIS — I10 Essential (primary) hypertension: Secondary | ICD-10-CM | POA: Diagnosis not present

## 2021-11-28 DIAGNOSIS — E782 Mixed hyperlipidemia: Secondary | ICD-10-CM | POA: Diagnosis not present

## 2021-12-19 DIAGNOSIS — T148XXA Other injury of unspecified body region, initial encounter: Secondary | ICD-10-CM | POA: Diagnosis not present

## 2021-12-19 DIAGNOSIS — E1122 Type 2 diabetes mellitus with diabetic chronic kidney disease: Secondary | ICD-10-CM | POA: Diagnosis not present

## 2021-12-30 DIAGNOSIS — M5136 Other intervertebral disc degeneration, lumbar region: Secondary | ICD-10-CM | POA: Diagnosis not present

## 2021-12-30 DIAGNOSIS — M4310 Spondylolisthesis, site unspecified: Secondary | ICD-10-CM | POA: Diagnosis not present

## 2021-12-30 DIAGNOSIS — N183 Chronic kidney disease, stage 3 unspecified: Secondary | ICD-10-CM | POA: Diagnosis not present

## 2022-02-20 DIAGNOSIS — J0100 Acute maxillary sinusitis, unspecified: Secondary | ICD-10-CM | POA: Diagnosis not present

## 2022-02-20 DIAGNOSIS — J209 Acute bronchitis, unspecified: Secondary | ICD-10-CM | POA: Diagnosis not present

## 2022-02-21 DIAGNOSIS — M25511 Pain in right shoulder: Secondary | ICD-10-CM | POA: Diagnosis not present

## 2022-02-21 DIAGNOSIS — M25512 Pain in left shoulder: Secondary | ICD-10-CM | POA: Diagnosis not present

## 2022-03-07 DIAGNOSIS — E7800 Pure hypercholesterolemia, unspecified: Secondary | ICD-10-CM | POA: Diagnosis not present

## 2022-03-07 DIAGNOSIS — I10 Essential (primary) hypertension: Secondary | ICD-10-CM | POA: Diagnosis not present

## 2022-03-07 DIAGNOSIS — E1169 Type 2 diabetes mellitus with other specified complication: Secondary | ICD-10-CM | POA: Diagnosis not present

## 2022-03-07 DIAGNOSIS — F410 Panic disorder [episodic paroxysmal anxiety] without agoraphobia: Secondary | ICD-10-CM | POA: Diagnosis not present

## 2022-03-07 DIAGNOSIS — N3281 Overactive bladder: Secondary | ICD-10-CM | POA: Diagnosis not present

## 2022-03-07 DIAGNOSIS — I2510 Atherosclerotic heart disease of native coronary artery without angina pectoris: Secondary | ICD-10-CM | POA: Diagnosis not present

## 2022-03-15 DIAGNOSIS — L603 Nail dystrophy: Secondary | ICD-10-CM | POA: Diagnosis not present

## 2022-03-15 DIAGNOSIS — D485 Neoplasm of uncertain behavior of skin: Secondary | ICD-10-CM | POA: Diagnosis not present

## 2022-03-15 DIAGNOSIS — L565 Disseminated superficial actinic porokeratosis (DSAP): Secondary | ICD-10-CM | POA: Diagnosis not present

## 2022-03-15 DIAGNOSIS — Z85828 Personal history of other malignant neoplasm of skin: Secondary | ICD-10-CM | POA: Diagnosis not present

## 2022-03-15 DIAGNOSIS — D2271 Melanocytic nevi of right lower limb, including hip: Secondary | ICD-10-CM | POA: Diagnosis not present

## 2022-03-15 DIAGNOSIS — D692 Other nonthrombocytopenic purpura: Secondary | ICD-10-CM | POA: Diagnosis not present

## 2022-03-21 DIAGNOSIS — E1122 Type 2 diabetes mellitus with diabetic chronic kidney disease: Secondary | ICD-10-CM | POA: Diagnosis not present

## 2022-03-23 DIAGNOSIS — H35033 Hypertensive retinopathy, bilateral: Secondary | ICD-10-CM | POA: Diagnosis not present

## 2022-03-23 DIAGNOSIS — E119 Type 2 diabetes mellitus without complications: Secondary | ICD-10-CM | POA: Diagnosis not present

## 2022-03-23 DIAGNOSIS — H524 Presbyopia: Secondary | ICD-10-CM | POA: Diagnosis not present

## 2022-03-23 DIAGNOSIS — H52223 Regular astigmatism, bilateral: Secondary | ICD-10-CM | POA: Diagnosis not present

## 2022-03-23 DIAGNOSIS — H5202 Hypermetropia, left eye: Secondary | ICD-10-CM | POA: Diagnosis not present

## 2022-03-23 DIAGNOSIS — H5211 Myopia, right eye: Secondary | ICD-10-CM | POA: Diagnosis not present

## 2022-03-30 ENCOUNTER — Encounter: Payer: Self-pay | Admitting: Cardiovascular Disease

## 2022-03-30 ENCOUNTER — Ambulatory Visit (INDEPENDENT_AMBULATORY_CARE_PROVIDER_SITE_OTHER): Payer: Medicare Other | Admitting: Cardiovascular Disease

## 2022-03-30 DIAGNOSIS — I10 Essential (primary) hypertension: Secondary | ICD-10-CM | POA: Diagnosis not present

## 2022-03-30 DIAGNOSIS — I2119 ST elevation (STEMI) myocardial infarction involving other coronary artery of inferior wall: Secondary | ICD-10-CM

## 2022-03-30 DIAGNOSIS — E782 Mixed hyperlipidemia: Secondary | ICD-10-CM

## 2022-03-30 NOTE — Assessment & Plan Note (Signed)
History of hyperlipidemia on high-dose statin therapy with most recent lipid profile performed 03/07/2022 revealing total cholesterol 217, LDL 130 and HDL 66.  She attributes the increase in her lipids to recent dietary indiscretion.  Apparently her PCP is going to recheck this in 4 months.  Her LDL goal should be less than 70.

## 2022-03-30 NOTE — Assessment & Plan Note (Signed)
History of CAD status post inferior STEMI 12/10/2018 with urgent cath by Dr. Mariea Clonts via the right radial approach revealing an occluded RCA which was deemed to be a "CTO" with left-to-right collaterals, high-grade mid and distal LAD stenoses which were both stented with drug-eluting stents.  She had a 50% mid AV groove circumflex.  EF at that time was 35 to 40% which ultimately improved to 45 to 50%.  She is completely asymptomatic.

## 2022-03-30 NOTE — Assessment & Plan Note (Signed)
History of essential hypertension blood pressure measured today at 124/64.  She is on amlodipine, lisinopril and carvedilol.

## 2022-03-30 NOTE — Progress Notes (Signed)
03/30/2022 [name]   21-Apr-1941  [id number]  Primary Physician Seward Carol, MD Primary Cardiologist: Lorretta Harp MD [name], [place]  HPI:  Desiree Rasmussen is a 81 y.o.    moderately overweight, married Caucasian female, mother of two, grandmother to two grandchildren, whose husband , Kaityln Kallstrom, unfortunately passed away on November 28, 2013. He was a patient of mine as well. He apparently died of complications of pneumonia.  I last saw her in the office 03/29/2021.  She has a history of hypertension and hyperlipidemia, as well as a family history of heart disease. She had chest pain and ruled out myocardial infarction on an ER visit earlier last year, and when I saw her she was complaining of atypical chest pain. A Myoview stress test performed in our office on May 01, 2012, was entirely normal. I thought her pain was more musculoskeletal and/or radicular and her symptoms subsequently resolved. Since I saw her a year ago she's remained fairly stable and is asymptomatic. She apparently needs a right total knee replacement by Dr. Alvan Dame 06/11/17.   She presented with an inferior STEMI 12/10/2018 and had urgent cath by Dr. Fletcher Anon via the right radial approach revealing an occluded RCA which was deemed to be an CTO with left-to-right collaterals, high-grade mid and distal LAD stenoses which were both stented with drug-eluting stents and a 50% mid AV groove circumflex stenosis.  Her EF at the time of cath was 35 to 45% and subsequent echo 45 to 50%.  She remains on aspirin and Brilinta was discontinued.   Since I saw her a year ago she has been stable.  She denies chest pain or shortness of breath.   Current Meds  Medication Sig   acetaminophen (TYLENOL) 500 MG tablet Take 500-1,000 mg by mouth every 8 (eight) hours as needed for mild pain, moderate pain or headache.   amLODipine (NORVASC) 5 MG tablet Take 5 mg by mouth daily.   aspirin 81 MG chewable tablet aspirin 81 mg chewable  tablet  Chew 1 tablet every day by oral route.   atorvastatin (LIPITOR) 80 MG tablet TAKE 1 TABLET BY MOUTH EVERY DAY AT 6PM   carvedilol (COREG) 6.25 MG tablet TAKE 1 TABLET BY MOUTH TWICE A DAY WITH MEALS   clorazepate (TRANXENE) 7.5 MG tablet Take 7.5 mg by mouth See admin instructions. Take 7.5 mg by mouth at bedtime and an additional 7.5 mg once a day as needed for anxiousness   Cyanocobalamin (VITAMIN B12) 1000 MCG TBCR 1 tablet   Dulaglutide (TRULICITY [place]) Inject into the skin.   fluticasone (FLONASE) 50 MCG/ACT nasal spray Place 2 sprays into both nostrils daily as needed for allergies or rhinitis.   furosemide (LASIX) 20 MG tablet furosemide 20 mg tablet   glimepiride (AMARYL) 2 MG tablet glimepiride 2 mg tablet   lisinopril (ZESTRIL) 40 MG tablet Take 40 mg by mouth daily.   liver oil-zinc oxide (DESITIN) 40 % ointment Apply 1 application topically as needed for irritation (UNDER THE BREASTS AND BETWEEN SKIN FOLDS).    loratadine (CLARITIN) 10 MG tablet Take 10 mg by mouth daily as needed for allergies or rhinitis.   metFORMIN (GLUCOPHAGE-XR) 500 MG 24 hr tablet Take 500 mg by mouth at bedtime.   Multiple Vitamins-Minerals (MULTIVITAMIN WITH MINERALS) tablet Take 1 tablet by mouth 3 (three) times a week.    nitroGLYCERIN (NITROSTAT) 0.4 MG SL tablet PLACE 1 TABLET UNDER TONGUE EVERY 5 MINUTES AS NEEDED FOR  CHEST PAIN   SYNTHROID 100 MCG tablet Take 100 mcg by mouth daily before breakfast.    vitamin C (ASCORBIC ACID) 500 MG tablet Take 500-1,000 mg by mouth daily.   Zinc 50 MG TABS 1 tablet     Allergies  Allergen Reactions   Lexapro [Escitalopram Oxalate] Diarrhea   Doxycycline Nausea And Vomiting    Upset stomach, also   Erythromycin Other (See Comments)    "upset stomach"   Erythromycin Base Nausea Only   Hydrocodone Other (See Comments)    Affected mood negatively 500 Mg    Macrobid [Nitrofurantoin] Other (See Comments)    Didn't work   Sulfa Antibiotics Rash   Zoloft  [Sertraline Hcl] Palpitations    Social History   Socioeconomic History   Marital status: Widowed    Spouse name: Not on file   Number of children: 2   Years of education: Not on file   Highest education level: Not on file  Occupational History   Not on file  Tobacco Use   Smoking status: Never   Smokeless tobacco: Never  Vaping Use   Vaping Use: Never used  Substance and Sexual Activity   Alcohol use: No   Drug use: Never   Sexual activity: Not on file  Other Topics Concern   Not on file  Social History Narrative   Not on file   Social Determinants of Health   Financial Resource Strain: Not on file  Food Insecurity: Not on file  Transportation Needs: Not on file  Physical Activity: Not on file  Stress: Not on file  Social Connections: Not on file  Intimate Partner Violence: Not on file     Review of Systems: General: negative for chills, fever, night sweats or weight changes.  Cardiovascular: negative for chest pain, dyspnea on exertion, edema, orthopnea, palpitations, paroxysmal nocturnal dyspnea or shortness of breath Dermatological: negative for rash Respiratory: negative for cough or wheezing Urologic: negative for hematuria Abdominal: negative for nausea, vomiting, diarrhea, bright red blood per rectum, melena, or hematemesis Neurologic: negative for visual changes, syncope, or dizziness All other systems reviewed and are otherwise negative except as noted above.    Blood pressure 124/64, pulse 78, height '5\' 3"'$  (1.6 m), weight 187 lb (84.8 kg).  General appearance: alert and no distress Neck: no adenopathy, no carotid bruit, no JVD, supple, symmetrical, trachea midline, and thyroid not enlarged, symmetric, no tenderness/mass/nodules Lungs: clear to auscultation bilaterally Heart: regular rate and rhythm, S1, S2 normal, no murmur, click, rub or gallop Extremities: extremities normal, atraumatic, no cyanosis or edema Pulses: 2+ and symmetric Skin: Skin  color, texture, turgor normal. No rashes or lesions Neurologic: Grossly normal  EKG sinus rhythm at 78 with poor R wave progression.  I personally reviewed this EKG.  ASSESSMENT AND PLAN:   Essential hypertension History of essential hypertension blood pressure measured today at 124/64.  She is on amlodipine, lisinopril and carvedilol.  Hyperlipidemia History of hyperlipidemia on high-dose statin therapy with most recent lipid profile performed 03/07/2022 revealing total cholesterol 217, LDL 130 and HDL 66.  She attributes the increase in her lipids to recent dietary indiscretion.  Apparently her PCP is going to recheck this in 4 months.  Her LDL goal should be less than 70.  Acute ST elevation myocardial infarction (STEMI) of inferior wall ([facility]) History of CAD status post inferior STEMI 12/10/2018 with urgent cath by Dr. Mariea Clonts via the right radial approach revealing an occluded RCA which was deemed to be a "  CTO" with left-to-right collaterals, high-grade mid and distal LAD stenoses which were both stented with drug-eluting stents.  She had a 50% mid AV groove circumflex.  EF at that time was 35 to 40% which ultimately improved to 45 to 50%.  She is completely asymptomatic.     Lorretta Harp MD FACP,FACC,FAHA, [facility] 03/30/2022 11:37 AM

## 2022-03-30 NOTE — Patient Instructions (Signed)

## 2022-05-10 DIAGNOSIS — R221 Localized swelling, mass and lump, neck: Secondary | ICD-10-CM | POA: Diagnosis not present

## 2022-05-17 ENCOUNTER — Other Ambulatory Visit: Payer: Self-pay | Admitting: Internal Medicine

## 2022-05-17 ENCOUNTER — Ambulatory Visit
Admission: RE | Admit: 2022-05-17 | Discharge: 2022-05-17 | Disposition: A | Discharge status: Home / Self Care | Payer: Medicare Other | Source: Ambulatory Visit | Attending: Internal Medicine | Admitting: Internal Medicine

## 2022-05-17 DIAGNOSIS — R221 Localized swelling, mass and lump, neck: Secondary | ICD-10-CM

## 2022-05-17 DIAGNOSIS — R590 Localized enlarged lymph nodes: Secondary | ICD-10-CM | POA: Diagnosis not present

## 2022-06-01 DIAGNOSIS — N183 Chronic kidney disease, stage 3 unspecified: Secondary | ICD-10-CM | POA: Diagnosis not present

## 2022-06-11 DIAGNOSIS — L814 Other melanin hyperpigmentation: Secondary | ICD-10-CM | POA: Diagnosis not present

## 2022-06-11 DIAGNOSIS — D485 Neoplasm of uncertain behavior of skin: Secondary | ICD-10-CM | POA: Diagnosis not present

## 2022-06-11 DIAGNOSIS — L821 Other seborrheic keratosis: Secondary | ICD-10-CM | POA: Diagnosis not present

## 2022-06-11 DIAGNOSIS — L570 Actinic keratosis: Secondary | ICD-10-CM | POA: Diagnosis not present

## 2022-06-11 DIAGNOSIS — Z85828 Personal history of other malignant neoplasm of skin: Secondary | ICD-10-CM | POA: Diagnosis not present

## 2022-06-11 DIAGNOSIS — D692 Other nonthrombocytopenic purpura: Secondary | ICD-10-CM | POA: Diagnosis not present

## 2022-06-12 DIAGNOSIS — R221 Localized swelling, mass and lump, neck: Secondary | ICD-10-CM | POA: Diagnosis not present

## 2022-06-12 DIAGNOSIS — E041 Nontoxic single thyroid nodule: Secondary | ICD-10-CM | POA: Diagnosis not present

## 2022-06-13 ENCOUNTER — Encounter: Payer: Self-pay | Admitting: *Deleted

## 2022-06-13 ENCOUNTER — Other Ambulatory Visit (HOSPITAL_COMMUNITY): Payer: Self-pay | Admitting: Otolaryngology

## 2022-06-13 ENCOUNTER — Other Ambulatory Visit: Payer: Self-pay | Admitting: Otolaryngology

## 2022-06-13 DIAGNOSIS — R221 Localized swelling, mass and lump, neck: Secondary | ICD-10-CM

## 2022-06-13 NOTE — Progress Notes (Unsigned)
Desiree Mariscal, MD  Desiree Rasmussen OK for US guided L Cx LN Bx.   Neck CT - image 60, series 3.   Sedation per pt request.   Cathren Harsh        Previous Messages    ----- Message -----  From: Desiree Rasmussen  Sent: 06/13/2022   9:57 AM EDT  To: Ir Procedure Requests  Subject: US Biopsy                                         Procedure:  US Biopsy   Reason:  Lt neck mass   History: CT neck in chart   Provider:  Mass of left side of neck   Contact:  [phone]

## 2022-06-21 ENCOUNTER — Other Ambulatory Visit: Payer: Self-pay | Admitting: Radiology

## 2022-06-21 DIAGNOSIS — R221 Localized swelling, mass and lump, neck: Secondary | ICD-10-CM

## 2022-06-22 ENCOUNTER — Other Ambulatory Visit (HOSPITAL_COMMUNITY): Payer: Self-pay | Admitting: Physician Assistant

## 2022-06-25 ENCOUNTER — Ambulatory Visit (HOSPITAL_COMMUNITY)
Admission: RE | Admit: 2022-06-25 | Discharge: 2022-06-25 | Disposition: A | Discharge status: Home / Self Care | Payer: Medicare Other | Source: Ambulatory Visit | Attending: Otolaryngology | Admitting: Otolaryngology

## 2022-06-25 ENCOUNTER — Encounter (HOSPITAL_COMMUNITY): Payer: Self-pay

## 2022-06-25 ENCOUNTER — Other Ambulatory Visit: Payer: Self-pay

## 2022-06-25 DIAGNOSIS — E785 Hyperlipidemia, unspecified: Secondary | ICD-10-CM | POA: Diagnosis not present

## 2022-06-25 DIAGNOSIS — I10 Essential (primary) hypertension: Secondary | ICD-10-CM | POA: Insufficient documentation

## 2022-06-25 DIAGNOSIS — E119 Type 2 diabetes mellitus without complications: Secondary | ICD-10-CM | POA: Diagnosis not present

## 2022-06-25 DIAGNOSIS — K219 Gastro-esophageal reflux disease without esophagitis: Secondary | ICD-10-CM | POA: Insufficient documentation

## 2022-06-25 DIAGNOSIS — R221 Localized swelling, mass and lump, neck: Secondary | ICD-10-CM | POA: Diagnosis not present

## 2022-06-25 LAB — GLUCOSE, CAPILLARY
Glucose-Capillary: 156 mg/dL — ABNORMAL HIGH (ref 70–99)
Glucose-Capillary: 113 mg/dL — ABNORMAL HIGH (ref 70–99)

## 2022-06-25 MED ORDER — MIDAZOLAM HCL 2 MG/2ML IJ SOLN
INTRAMUSCULAR | Status: AC | PRN
  Administered 2022-06-25: .5 mg via INTRAVENOUS

## 2022-06-25 MED ORDER — LIDOCAINE HCL (PF) 1 % IJ SOLN
INTRAMUSCULAR | Status: AC
  Filled 2022-06-25: qty 30

## 2022-06-25 MED ORDER — MIDAZOLAM HCL 2 MG/2ML IJ SOLN
INTRAMUSCULAR | Status: AC
  Filled 2022-06-25: qty 2

## 2022-06-25 MED ORDER — FENTANYL CITRATE (PF) 100 MCG/2ML IJ SOLN
INTRAMUSCULAR | Status: AC | PRN
  Administered 2022-06-25: 25 ug via INTRAVENOUS

## 2022-06-25 MED ORDER — SODIUM CHLORIDE 0.9 % IV SOLN: INTRAVENOUS | Status: DC

## 2022-06-25 MED ORDER — FENTANYL CITRATE (PF) 100 MCG/2ML IJ SOLN
INTRAMUSCULAR | Status: AC
  Filled 2022-06-25: qty 2

## 2022-06-25 NOTE — Sedation Documentation (Signed)
As per Dr. Anselm Pancoast, Aspirin can be restarted today. Patient and Short stay RN informed.

## 2022-06-25 NOTE — Procedures (Signed)
Interventional Radiology Procedure:   Indications: Left neck lesion   Procedure: US guided aspiration  Findings: Left neck lesion is cystic.  Completely aspirated lesion with 18 gauge needle.  4 ml of yellow fluid removed.  Fluid send for cytology and culture.   Complications: No immediate complications noted.     EBL: Minimal  Plan: Discharge to home    [place]. Anselm Pancoast, MD  Pager: [phone]

## 2022-06-25 NOTE — H&P (Signed)
Chief Complaint: Patient was seen in consultation today for left neck mass  Referring Physician(s): Hoshal,Steven  Supervising Physician: Markus Daft  Patient Status: [facility] - Out-pt  History of Present Illness: Desiree Rasmussen is a 81 y.o. female with history of GERD, HLD, DM, HTN presents with left neck mass after applying make-up at home.  She brought the mass to the attention of her PCP who referred her ENT.  IR now consulted for left neck mass biopsy at the request of Dr. Sabino Gasser.   Desiree Rasmussen presents today in her usual state of health.  She has been NPO.  She does not take blood thinners.  She requests sedation with the procedure today and states her daughter Arrie Aran is available for care and transportation at home.   Past Medical History:  Diagnosis Date   BPPV (benign paroxysmal positional vertigo), left    Depression    Family history of heart disease    GERD (gastroesophageal reflux disease)    History of nuclear stress test 05/01/2012   dr berry   normal lexiscan nuclear study w/ no ischemia/  normal LV function and wall motion , ef 73%   Hyperlipidemia    Hypertension    Hypothyroidism    OA (osteoarthritis)    Type 2 diabetes mellitus ([place])    Wears contact lenses    left eye only   Wears partial dentures    upper    Past Surgical History:  Procedure Laterality Date   CATARACT EXTRACTION W/ INTRAOCULAR LENS  IMPLANT, BILATERAL  06/2018   CESAREAN SECTION  1969;  1972   CORONARY/GRAFT ACUTE MI REVASCULARIZATION N/A 12/10/2018   Procedure: CORONARY/GRAFT ACUTE MI REVASCULARIZATION;  Surgeon: Wellington Hampshire, MD;  Location: [place] [facility];  Service: Cardiovascular;  Laterality: N/A;   CYSTECTOMY     sebaceous cyst on breast   LEFT HEART CATH AND CORONARY ANGIOGRAPHY N/A 12/10/2018   Procedure: LEFT HEART CATH AND CORONARY ANGIOGRAPHY;  Surgeon: Wellington Hampshire, MD;  Location: [place] [facility];  Service: Cardiovascular;  Laterality: N/A;   TOTAL KNEE  ARTHROPLASTY Right 06/11/2017   Procedure: RIGHT TOTAL KNEE ARTHROPLASTY;  Surgeon: Paralee Cancel, MD;  Location: [facility];  Service: Orthopedics;  Laterality: Right;  70 mins   TOTAL KNEE ARTHROPLASTY Left 08/19/2018   Procedure: TOTAL KNEE ARTHROPLASTY;  Surgeon: Paralee Cancel, MD;  Location: [facility];  Service: Orthopedics;  Laterality: Left;    Allergies: Lexapro [escitalopram oxalate], Doxycycline, Erythromycin, Hydrocodone, Macrobid [nitrofurantoin], Sulfa antibiotics, and Zoloft [sertraline hcl]  Medications: Prior to Admission medications   Medication Sig Start Date End Date Taking? Authorizing Provider  acetaminophen (TYLENOL) 500 MG tablet Take 500-1,000 mg by mouth every 8 (eight) hours as needed for mild pain, moderate pain or headache.   Yes [provider]  amLODipine (NORVASC) 5 MG tablet Take 5 mg by mouth daily.   Yes [provider]  Ascorbic Acid (VITAMIN C PO) Take 1 tablet by mouth every other day. Alternates days with multivitamin   Yes [provider]  aspirin 81 MG chewable tablet Chew 81 mg by mouth daily.   Yes [provider]  atorvastatin (LIPITOR) 80 MG tablet TAKE 1 TABLET BY MOUTH EVERY DAY AT 6PM Patient taking differently: Take 40 mg by mouth daily at 6 PM. 10/10/21  Yes Lorretta Harp, MD  carvedilol (COREG) 25 MG tablet Take 25 mg by mouth 2 (two) times daily with a meal.   Yes [provider]  Cyanocobalamin (VITAMIN B12) 1000 MCG TBCR Take 1,000 mcg by mouth daily.   Yes [provider]  fluticasone (FLONASE) 50 MCG/ACT nasal spray Place 2 sprays into both nostrils daily as needed for allergies or rhinitis.   Yes [provider]  furosemide (LASIX) 20 MG tablet Take 20 mg by mouth daily as needed for edema.   Yes [provider]  glimepiride (AMARYL) 2 MG tablet Take 2 mg by mouth daily with breakfast.   Yes [provider]  HYDROcodone-acetaminophen (NORCO) 7.5-325 MG tablet Take  0.5 tablets by mouth every 6 (six) hours as needed for moderate pain.   Yes [provider]  lisinopril (ZESTRIL) 40 MG tablet Take 40 mg by mouth daily.   Yes [provider]  loratadine (CLARITIN) 10 MG tablet Take 10 mg by mouth daily as needed for allergies or rhinitis.   Yes [provider]  Multiple Vitamins-Minerals (MULTIVITAMIN WITH MINERALS) tablet Take 1 tablet by mouth every other day. Alternates with vitamin c   Yes [provider]  omeprazole (PRILOSEC OTC) 20 MG tablet Take 20 mg by mouth daily as needed (acid reflux).   Yes [provider]  Polyvinyl Alcohol-Povidone (REFRESH OP) Place 1 drop into both eyes daily as needed (dry eyes).   Yes [provider]  SYNTHROID 100 MCG tablet Take 100 mcg by mouth daily before breakfast.  09/04/11  Yes [provider]  clorazepate (TRANXENE) 7.5 MG tablet Take 7.5 mg by mouth 2 (two) times daily as needed for anxiety.    [provider]  liver oil-zinc oxide (DESITIN) 40 % ointment Apply 1 application topically as needed for irritation (UNDER THE BREASTS AND BETWEEN SKIN FOLDS).     [provider]  nitroGLYCERIN (NITROSTAT) 0.4 MG SL tablet PLACE 1 TABLET UNDER TONGUE EVERY 5 MINUTES AS NEEDED FOR CHEST PAIN 07/28/20   Lorretta Harp, MD     Family History  Problem Relation Age of Onset   Colon cancer Mother 43   Heart disease Father 58       heart attack   Breast cancer Maternal Aunt 75   Colon cancer Maternal Uncle    Heart disease Brother        CABG at 24    Social History   Socioeconomic History   Marital status: Widowed    Spouse name: Not on file   Number of children: 2   Years of education: Not on file   Highest education level: Not on file  Occupational History   Not on file  Tobacco Use   Smoking status: Never   Smokeless tobacco: Never  Vaping Use   Vaping Use: Never used  Substance and Sexual Activity   Alcohol use: No   Drug  use: Never   Sexual activity: Not on file  Other Topics Concern   Not on file  Social History Narrative   Not on file   Social Determinants of Health   Financial Resource Strain: Not on file  Food Insecurity: Not on file  Transportation Needs: Not on file  Physical Activity: Not on file  Stress: Not on file  Social Connections: Not on file     Review of Systems: A 12 point ROS discussed and pertinent positives are indicated in the HPI above.  All other systems are negative.  Review of Systems  Constitutional:  Negative for fatigue and fever.  Respiratory:  Negative for cough and shortness of breath.   Cardiovascular:  Negative  for chest pain.  Gastrointestinal:  Negative for abdominal pain.  Musculoskeletal:  Negative for back pain.  Psychiatric/Behavioral:  Negative for behavioral problems and confusion.     Vital Signs: BP 137/75   Pulse 78   Temp 98.7 F (37.1 C)   Resp 13   Ht '5\' 3"'$  (1.6 m)   Wt 190 lb (86.2 kg)   SpO2 94%   BMI 33.66 kg/m   Physical Exam Vitals and nursing note reviewed.  Constitutional:      General: She is not in acute distress.    Appearance: Normal appearance. She is not ill-appearing.  HENT:     Mouth/Throat:     Mouth: Mucous membranes are moist.     Pharynx: Oropharynx is clear.  Cardiovascular:     Rate and Rhythm: Normal rate and regular rhythm.  Pulmonary:     Effort: Pulmonary effort is normal.     Breath sounds: Normal breath sounds.  Abdominal:     General: Abdomen is flat.     Palpations: Abdomen is soft.  Skin:    General: Skin is warm and dry.  Neurological:     General: No focal deficit present.     Mental Status: She is alert and oriented to person, place, and time. Mental status is at baseline.  Psychiatric:        Mood and Affect: Mood normal.        Behavior: Behavior normal.        Thought Content: Thought content normal.        Judgment: Judgment normal.      MD Evaluation Airway: WNL Heart:  WNL Abdomen: WNL Chest/ Lungs: WNL ASA  Classification: 3 Mallampati/Airway Score: Two   Imaging: No results found.  Labs:  CBC: No results for input(s): "WBC", "HGB", "HCT", "PLT" in the last 8760 hours.  COAGS: No results for input(s): "INR", "APTT" in the last 8760 hours.  BMP: No results for input(s): "NA", "K", "CL", "CO2", "GLUCOSE", "BUN", "CALCIUM", "CREATININE", "GFRNONAA", "GFRAA" in the last 8760 hours.  Invalid input(s): "CMP"  LIVER FUNCTION TESTS: No results for input(s): "BILITOT", "AST", "ALT", "ALKPHOS", "PROT", "ALBUMIN" in the last 8760 hours.  TUMOR MARKERS: No results for input(s): "AFPTM", "CEA", "CA199", "CHROMGRNA" in the last 8760 hours.  Assessment and Plan: Patient with past medical history of HTN presents with complaint of left neck mass.  IR consulted for left neck mass biopsy at the request of Dr. Sabino Gasser. Case reviewed by Dr. Anselm Pancoast who approves patient for procedure.  Patient presents today in their usual state of health.  She has been NPO and is not currently on blood thinners.   Risks and benefits was discussed with the patient and/or patient's family including, but not limited to bleeding, infection, damage to adjacent structures or low yield requiring additional tests.  All of the questions were answered and there is agreement to proceed.  Consent signed and in chart.   Thank you for this interesting consult.  I greatly enjoyed meeting Desiree Rasmussen and look forward to participating in their care.  A copy of this report was sent to the requesting provider on this date.  Electronically Signed: Docia Barrier, PA 06/25/2022, 12:28 PM   I spent a total of  30 Minutes   in face to face in clinical consultation, greater than 50% of which was counseling/coordinating care for left neck mass.

## 2022-06-27 LAB — CYTOLOGY - NON PAP

## 2022-06-28 LAB — BODY FLUID CULTURE W GRAM STAIN: Culture: NO GROWTH

## 2022-07-03 ENCOUNTER — Telehealth: Payer: Self-pay | Admitting: Cardiovascular Disease

## 2022-07-03 NOTE — Telephone Encounter (Signed)
Primary Cardiologist:Jonathan Gwenlyn Found, MD   Preoperative team, please contact this patient and set up a phone call appointment for later than 07/20/22 for further preoperative risk assessment. Please obtain consent and complete medication review. Thank you for your help.   Ideally aspirin should be continued without interruption, however if the bleeding risk is too great, aspirin may be held for 7 days prior to surgery. Please resume aspirin post operatively when it is felt to be safe from a bleeding standpoint.   Emmaline Life, NP-C  07/03/2022, 4:12 PM [address], [address] Office [phone] Fax [phone]

## 2022-07-03 NOTE — Telephone Encounter (Signed)
   Pre-operative Risk Assessment    Patient Name: Desiree Rasmussen  DOB: 1941-05-25 MRN: [id number]      Request for Surgical Clearance    Procedure:   incision of left neck tumor  Date of Surgery:  Clearance 08/10/22                                 Surgeon:  Dr. Hassie Bruce Surgeon's Group or Practice Name:  [facility] Phone number:  [phone] Fax number:  [phone]   Type of Clearance Requested:   - Medical  - Pharmacy:  Hold Aspirin, Clopidogrel (Plavix), Apixaban (Eliquis), Rivaroxaban (Xarelto), and Warfarin (Coumadin)     Type of Anesthesia:  General    Additional requests/questions:      Eston Mould   07/03/2022, 2:47 PM

## 2022-07-04 DIAGNOSIS — M25512 Pain in left shoulder: Secondary | ICD-10-CM | POA: Diagnosis not present

## 2022-07-04 DIAGNOSIS — M25511 Pain in right shoulder: Secondary | ICD-10-CM | POA: Diagnosis not present

## 2022-07-05 NOTE — Telephone Encounter (Signed)
Line was busy

## 2022-07-06 NOTE — Telephone Encounter (Signed)
I left a message for the pt to call back for a tele pre op appt.    In reviewing the clearance notes that were entered under the medications to be held. Pt is only on ASA and no other blood thinner or anticoagulant.

## 2022-07-09 ENCOUNTER — Telehealth: Payer: Self-pay | Admitting: *Deleted

## 2022-07-09 NOTE — Telephone Encounter (Signed)
Pt has been scheduled for a tele visit, 07/17/22 9:40.  Consent on file / medications reconciled.

## 2022-07-09 NOTE — Telephone Encounter (Signed)
Pt has been scheduled for a tele visit, 07/17/22 9:40.  Consent on file / medications reconciled.    Patient Consent for Virtual Visit        Desiree Rasmussen has provided verbal consent on 07/09/2022 for a virtual visit (video or telephone).   CONSENT FOR VIRTUAL VISIT FOR:  Desiree Rasmussen  By participating in this virtual visit I agree to the following:  I hereby voluntarily request, consent and authorize [facility] and its employed or contracted physicians, physician assistants, nurse practitioners or other licensed health care professionals (the Practitioner), to provide me with telemedicine health care services (the "Services") as deemed necessary by the treating Practitioner. I acknowledge and consent to receive the Services by the Practitioner via telemedicine. I understand that the telemedicine visit will involve communicating with the Practitioner through live audiovisual communication technology and the disclosure of certain medical information by electronic transmission. I acknowledge that I have been given the opportunity to request an in-person assessment or other available alternative prior to the telemedicine visit and am voluntarily participating in the telemedicine visit.  I understand that I have the right to withhold or withdraw my consent to the use of telemedicine in the course of my care at any time, without affecting my right to future care or treatment, and that the Practitioner or I may terminate the telemedicine visit at any time. I understand that I have the right to inspect all information obtained and/or recorded in the course of the telemedicine visit and may receive copies of available information for a reasonable fee.  I understand that some of the potential risks of receiving the Services via telemedicine include:  Delay or interruption in medical evaluation due to technological equipment failure or disruption; Information transmitted may not be sufficient  (e.g. poor resolution of images) to allow for appropriate medical decision making by the Practitioner; and/or  In rare instances, security protocols could fail, causing a breach of personal health information.  Furthermore, I acknowledge that it is my responsibility to provide information about my medical history, conditions and care that is complete and accurate to the best of my ability. I acknowledge that Practitioner's advice, recommendations, and/or decision may be based on factors not within their control, such as incomplete or inaccurate data provided by me or distortions of diagnostic images or specimens that may result from electronic transmissions. I understand that the practice of medicine is not an exact science and that Practitioner makes no warranties or guarantees regarding treatment outcomes. I acknowledge that a copy of this consent can be made available to me via my patient portal ([facility]), or I can request a printed copy by calling the office of [facility].    I understand that my insurance will be billed for this visit.   I have read or had this consent read to me. I understand the contents of this consent, which adequately explains the benefits and risks of the Services being provided via telemedicine.  I have been provided ample opportunity to ask questions regarding this consent and the Services and have had my questions answered to my satisfaction. I give my informed consent for the services to be provided through the use of telemedicine in my medical care

## 2022-07-11 DIAGNOSIS — Z23 Encounter for immunization: Secondary | ICD-10-CM | POA: Diagnosis not present

## 2022-07-11 DIAGNOSIS — I10 Essential (primary) hypertension: Secondary | ICD-10-CM | POA: Diagnosis not present

## 2022-07-11 DIAGNOSIS — E663 Overweight: Secondary | ICD-10-CM | POA: Diagnosis not present

## 2022-07-11 DIAGNOSIS — F331 Major depressive disorder, recurrent, moderate: Secondary | ICD-10-CM | POA: Diagnosis not present

## 2022-07-11 DIAGNOSIS — E1122 Type 2 diabetes mellitus with diabetic chronic kidney disease: Secondary | ICD-10-CM | POA: Diagnosis not present

## 2022-07-11 DIAGNOSIS — E039 Hypothyroidism, unspecified: Secondary | ICD-10-CM | POA: Diagnosis not present

## 2022-07-11 DIAGNOSIS — N1831 Chronic kidney disease, stage 3a: Secondary | ICD-10-CM | POA: Diagnosis not present

## 2022-07-11 DIAGNOSIS — R221 Localized swelling, mass and lump, neck: Secondary | ICD-10-CM | POA: Diagnosis not present

## 2022-07-11 DIAGNOSIS — E7800 Pure hypercholesterolemia, unspecified: Secondary | ICD-10-CM | POA: Diagnosis not present

## 2022-07-11 DIAGNOSIS — I2510 Atherosclerotic heart disease of native coronary artery without angina pectoris: Secondary | ICD-10-CM | POA: Diagnosis not present

## 2022-07-17 ENCOUNTER — Ambulatory Visit: Payer: Medicare Other | Attending: Cardiovascular Disease | Admitting: Nurse Practitioner

## 2022-07-17 DIAGNOSIS — Z01810 Encounter for preprocedural cardiovascular examination: Secondary | ICD-10-CM

## 2022-07-17 NOTE — Progress Notes (Signed)
Virtual Visit via Telephone Note   Because of Desiree Rasmussen's co-morbid illnesses, she is at least at moderate risk for complications without adequate follow up.  This format is felt to be most appropriate for this patient at this time.  The patient did not have access to video technology/had technical difficulties with video requiring transitioning to audio format only (telephone).  All issues noted in this document were discussed and addressed.  No physical exam could be performed with this format.  Please refer to the patient's chart for her consent to telehealth for [facility].  Evaluation Performed:  Preoperative cardiovascular risk assessment _____________   Date:  07/17/2022   Patient ID:  Desiree Rasmussen, DOB Sep 11, 1941, MRN [id number] Patient Location:  Home Provider location:   Office  Primary Care Provider:  Seward Carol, MD Primary Cardiologist:  Quay Burow, MD  Chief Complaint / Patient Profile   81 y.o. y/o female with a h/o PAD s/p STEMI in 11/2018, DES-mLAD and dLAD, CTO-RCA, hypertension, hyperlipidemia, type 2 diabetes, OSA, GERD, and BPPV who is pending excision of left neck tumor on 08/10/2022 with Dr. Hassie Bruce of [facility] and presents today for telephonic preoperative cardiovascular risk assessment.  Past Medical History    Past Medical History:  Diagnosis Date   BPPV (benign paroxysmal positional vertigo), left    Depression    Family history of heart disease    GERD (gastroesophageal reflux disease)    History of nuclear stress test 05/01/2012   dr berry   normal lexiscan nuclear study w/ no ischemia/  normal LV function and wall motion , ef 73%   Hyperlipidemia    Hypertension    Hypothyroidism    OA (osteoarthritis)    Type 2 diabetes mellitus (Desiree Rasmussen)    Wears contact lenses    left eye only   Wears partial dentures    upper   Past Surgical History:  Procedure Laterality Date   CATARACT EXTRACTION W/ INTRAOCULAR LENS   IMPLANT, BILATERAL  06/2018   CESAREAN SECTION  1969;  1972   CORONARY/GRAFT ACUTE MI REVASCULARIZATION N/A 12/10/2018   Procedure: CORONARY/GRAFT ACUTE MI REVASCULARIZATION;  Surgeon: Wellington Hampshire, MD;  Location: [place] [facility];  Service: Cardiovascular;  Laterality: N/A;   CYSTECTOMY     sebaceous cyst on breast   LEFT HEART CATH AND CORONARY ANGIOGRAPHY N/A 12/10/2018   Procedure: LEFT HEART CATH AND CORONARY ANGIOGRAPHY;  Surgeon: Wellington Hampshire, MD;  Location: [place] [facility];  Service: Cardiovascular;  Laterality: N/A;   TOTAL KNEE ARTHROPLASTY Right 06/11/2017   Procedure: RIGHT TOTAL KNEE ARTHROPLASTY;  Surgeon: Paralee Cancel, MD;  Location: [facility];  Service: Orthopedics;  Laterality: Right;  70 mins   TOTAL KNEE ARTHROPLASTY Left 08/19/2018   Procedure: TOTAL KNEE ARTHROPLASTY;  Surgeon: Paralee Cancel, MD;  [facility]: [facility];  Service: Orthopedics;  Laterality: Left;    Allergies  Allergies  Allergen Reactions   Lexapro [Escitalopram Oxalate] Diarrhea   Doxycycline Nausea And Vomiting    Upset stomach, also   Erythromycin Other (See Comments)    "upset stomach"   Hydrocodone Other (See Comments)    Affected mood negatively at 7.5-325 mg strength (tolerates 5-325)   Macrobid [Nitrofurantoin] Other (See Comments)    Didn't work   Sulfa Antibiotics Rash   Zoloft [Sertraline Hcl] Palpitations    History of Present Illness    Desiree Rasmussen is a 81 y.o. female who presents via [employer] (consulting) for  a telehealth visit today.  Pt was last seen in cardiology clinic on 03/30/2022 by Dr. Gwenlyn Found.  At that time Desiree Rasmussen was doing well. The patient is now pending procedure as outlined above. Since her last visit, she has been stable from a cardiac standpoint. She denies chest pain, palpitations, dyspnea, pnd, orthopnea, n, v, dizziness, syncope, edema, weight gain, or early satiety. All other systems reviewed and are otherwise negative except as noted above.    Home Medications    Prior to Admission medications   Medication Sig Start Date End Date Taking? Authorizing Provider  acetaminophen (TYLENOL) 500 MG tablet Take 500-1,000 mg by mouth every 8 (eight) hours as needed for mild pain, moderate pain or headache.    [provider]  amLODipine (NORVASC) 5 MG tablet Take 5 mg by mouth daily.    [provider]  Ascorbic Acid (VITAMIN C PO) Take 1 tablet by mouth every other day. Alternates days with multivitamin    [provider]  aspirin 81 MG chewable tablet Chew 81 mg by mouth daily.    [provider]  atorvastatin (LIPITOR) 40 MG tablet Take 40 mg by mouth daily.    [provider]  carvedilol (COREG) 25 MG tablet Take 25 mg by mouth 2 (two) times daily with a meal.    [provider]  clorazepate (TRANXENE) 7.5 MG tablet Take 7.5 mg by mouth 2 (two) times daily as needed for anxiety.    [provider]  Cyanocobalamin (VITAMIN B12) 1000 MCG TBCR Take 1,000 mcg by mouth daily.    [provider]  fluticasone (FLONASE) 50 MCG/ACT nasal spray Place 2 sprays into both nostrils daily as needed for allergies or rhinitis.    [provider]  furosemide (LASIX) 20 MG tablet Take 20 mg by mouth daily as needed for edema.    [provider]  glimepiride (AMARYL) 2 MG tablet Take 2 mg by mouth daily with breakfast.    [provider]  HYDROcodone-acetaminophen (NORCO) 7.5-325 MG tablet Take 0.5 tablets by mouth every 6 (six) hours as needed for moderate pain.    [provider]  lisinopril (ZESTRIL) 40 MG tablet Take 40 mg by mouth daily.    [provider]  liver oil-zinc oxide (DESITIN) 40 % ointment Apply 1 application topically as needed for irritation (UNDER THE BREASTS AND BETWEEN SKIN FOLDS).     [provider]  loratadine (CLARITIN) 10 MG tablet Take 10 mg by mouth daily as needed for allergies or rhinitis.    [provider]  Multiple Vitamins-Minerals (MULTIVITAMIN WITH MINERALS) tablet Take 1 tablet by mouth every other day. Alternates with vitamin c    [provider]  nitroGLYCERIN (NITROSTAT) 0.4 MG SL tablet PLACE 1 TABLET UNDER TONGUE EVERY 5 MINUTES AS NEEDED FOR CHEST PAIN 07/28/20   Lorretta Harp, MD  omeprazole (PRILOSEC OTC) 20 MG tablet Take 20 mg by mouth daily as needed (acid reflux).    [provider]  Polyvinyl Alcohol-Povidone (REFRESH OP) Place 1 drop into both eyes daily as needed (dry eyes).    [provider]  SYNTHROID 100 MCG tablet Take 100 mcg by mouth daily before breakfast.  09/04/11   [provider]    Physical Exam    Vital Signs:  Desiree Rasmussen does not have vital signs available for review today.  Given telephonic nature of communication, physical exam is limited. AAOx3. NAD. Normal affect.  Speech  and respirations are unlabored.  Accessory Clinical Findings    None  Assessment & Plan    1.  Preoperative Cardiovascular Risk Assessment: According to the Revised Cardiac Risk Index (RCRI), her Perioperative Risk of Major Cardiac Event is (%): 0.9 . Her Functional Capacity in METs is: 5.07 according to the [place] Activity Status Index (DASI).Therefore, based on ACC/AHA guidelines, patient would be at acceptable risk for the planned procedure without further cardiovascular testing.  The patient was advised that if she develops new symptoms prior to surgery to contact our office to arrange for a follow-up visit, and she verbalized understanding.  Ideally aspirin should be continued without interruption, however if the bleeding risk is too great, aspirin may be held for 5-7 days prior to surgery. Please resume aspirin post operatively when it is felt to be safe from a bleeding standpoint.   A copy of this note will be routed to requesting surgeon.  Time:   Today, I have spent 6 minutes with the patient with telehealth  technology discussing medical history, symptoms, and management plan.     Lenna Sciara, NP  07/17/2022, 9:51 AM

## 2022-07-24 DIAGNOSIS — H52222 Regular astigmatism, left eye: Secondary | ICD-10-CM | POA: Diagnosis not present

## 2022-07-24 DIAGNOSIS — H5202 Hypermetropia, left eye: Secondary | ICD-10-CM | POA: Diagnosis not present

## 2022-07-24 DIAGNOSIS — H53143 Visual discomfort, bilateral: Secondary | ICD-10-CM | POA: Diagnosis not present

## 2022-07-24 DIAGNOSIS — H524 Presbyopia: Secondary | ICD-10-CM | POA: Diagnosis not present

## 2022-07-24 DIAGNOSIS — H5211 Myopia, right eye: Secondary | ICD-10-CM | POA: Diagnosis not present

## 2022-07-30 ENCOUNTER — Other Ambulatory Visit: Payer: Self-pay | Admitting: Otolaryngology

## 2022-07-31 DIAGNOSIS — R221 Localized swelling, mass and lump, neck: Secondary | ICD-10-CM | POA: Diagnosis not present

## 2022-07-31 NOTE — Pre-Procedure Instructions (Addendum)
Surgical Instructions    Your procedure is scheduled on Friday 08/10/22.   Report to [facility] Main Entrance "A" at 05:30 A.M., then check in with the Admitting office.  Call this number if you have problems the morning of surgery:  [phone]   If you have any questions prior to your surgery date call [phone]: Open Monday-Friday 8am-4pm If you experience any cold or flu symptoms such as cough, fever, chills, shortness of breath, etc. between now and your scheduled surgery, please notify us at the above number     Remember:  Do not eat after midnight the night before your surgery  You may drink clear liquids until 04:30 A.M. the morning of your surgery.   Clear liquids allowed are: Water, Non-Citrus Juices (without pulp), Carbonated Beverages, Clear Tea, Black Coffee ONLY (NO MILK, CREAM OR POWDERED CREAMER of any kind), and Gatorade    Take these medicines the morning of surgery with A SIP OF WATER:   amLODipine (NORVASC)  atorvastatin (LIPITOR)  carvedilol (COREG)   SYNTHROID    Take these medicines if needed:   acetaminophen (TYLENOL)   fluticasone (FLONASE)  HYDROcodone-acetaminophen (NORCO)  loratadine (CLARITIN)  nitroGLYCERIN (NITROSTAT)  omeprazole (PRILOSEC OTC)  Polyvinyl Alcohol-Povidone (REFRESH OP)  As of today, STOP taking any Aspirin (unless otherwise instructed by your surgeon) Aleve, Naproxen, Ibuprofen, Motrin, Advil, Goody's, BC's, all herbal medications, fish oil, and all vitamins.  WHAT DO I DO ABOUT MY DIABETES MEDICATION?   Do not take oral diabetes medicines (pills) the morning of surgery.  DO NOT TAKE glimepiride (AMARYL) the morning of surgery.   The day of surgery, do not take other diabetes injectables, including Byetta (exenatide), Bydureon (exenatide ER), Victoza (liraglutide), or Trulicity (dulaglutide).   HOW TO MANAGE YOUR DIABETES BEFORE AND AFTER SURGERY  Why is it important to control my blood sugar before and after  surgery? Improving blood sugar levels before and after surgery helps healing and can limit problems. A way of improving blood sugar control is eating a healthy diet by:  Eating less sugar and carbohydrates  Increasing activity/exercise  Talking with your doctor about reaching your blood sugar goals High blood sugars (greater than 180 mg/dL) can raise your risk of infections and slow your recovery, so you will need to focus on controlling your diabetes during the weeks before surgery. Make sure that the doctor who takes care of your diabetes knows about your planned surgery including the date and location.  How do I manage my blood sugar before surgery? Check your blood sugar at least 4 times a day, starting 2 days before surgery, to make sure that the level is not too high or low.  Check your blood sugar the morning of your surgery when you wake up and every 2 hours until you get to the Short Stay unit.  If your blood sugar is less than 70 mg/dL, you will need to treat for low blood sugar: Do not take insulin. Treat a low blood sugar (less than 70 mg/dL) with  cup of clear juice (cranberry or apple), 4 glucose tablets, OR glucose gel. Recheck blood sugar in 15 minutes after treatment (to make sure it is greater than 70 mg/dL). If your blood sugar is not greater than 70 mg/dL on recheck, call [phone] for further instructions. Report your blood sugar to the short stay nurse when you get to Short Stay.  If you are admitted to the hospital after surgery: Your blood sugar will be checked by the  staff and you will probably be given insulin after surgery (instead of oral diabetes medicines) to make sure you have good blood sugar levels. The goal for blood sugar control after surgery is 80-180 mg/dL.            Do not wear jewelry or makeup. Do not wear lotions, powders, perfumes/cologne or deodorant. Do not shave 48 hours prior to surgery.  Men may shave face and neck. Do not bring  valuables to the hospital. Do not wear nail polish, gel polish, artificial nails, or any other type of covering on natural nails (fingers and toes) If you have artificial nails or gel coating that need to be removed by a nail salon, please have this removed prior to surgery. Artificial nails or gel coating may interfere with anesthesia's ability to adequately monitor your vital signs.  [place] is not responsible for any belongings or valuables.    Do NOT Smoke (Tobacco/Vaping)  24 hours prior to your procedure  If you use a CPAP at night, you may bring your mask for your overnight stay.   Contacts, glasses, hearing aids, dentures or partials may not be worn into surgery, please bring cases for these belongings   For patients admitted to the hospital, discharge time will be determined by your treatment team.   Patients discharged the day of surgery will not be allowed to drive home, and someone needs to stay with them for 24 hours.   SURGICAL WAITING ROOM VISITATION Patients having surgery or a procedure may have no more than 2 support people in the waiting area - these visitors may rotate.   Children under the age of 22 must have an adult with them who is not the patient. If the patient needs to stay at the hospital during part of their recovery, the visitor guidelines for inpatient rooms apply. Pre-op nurse will coordinate an appropriate time for 1 support person to accompany patient in pre-op.  This support person may not rotate.   Please refer to [email or web address] for the visitor guidelines for Inpatients (after your surgery is over and you are in a regular room).    Special instructions:    Oral Hygiene is also important to reduce your risk of infection.  Remember - BRUSH YOUR TEETH THE MORNING OF SURGERY WITH YOUR REGULAR TOOTHPASTE   [place]- Preparing For Surgery  Before surgery, you can play an important role.  Because skin is not sterile, your skin needs to be as free of germs as possible. You can reduce the number of germs on your skin by washing with CHG (chlorahexidine gluconate) Soap before surgery.  CHG is an antiseptic cleaner which kills germs and bonds with the skin to continue killing germs even after washing.     Please do not use if you have an allergy to CHG or antibacterial soaps. If your skin becomes reddened/irritated stop using the CHG.  Do not shave (including legs and underarms) for at least 48 hours prior to first CHG shower. It is OK to shave your face.  Please follow these instructions carefully.     Shower the NIGHT BEFORE SURGERY and the MORNING OF SURGERY with CHG Soap.   If you chose to wash your hair, wash your hair first as usual with your normal shampoo. After you shampoo, rinse your hair and body thoroughly to remove the shampoo.  Then [employer] and genitals (private parts) with your normal soap and rinse thoroughly to remove soap.  After that Use  CHG Soap as you would any other liquid soap. You can apply CHG directly to the skin and wash gently with a scrungie or a clean washcloth.   Apply the CHG Soap to your body ONLY FROM THE NECK DOWN.  Do not use on open wounds or open sores. Avoid contact with your eyes, ears, mouth and genitals (private parts). Wash Face and genitals (private parts)  with your normal soap.   Wash thoroughly, paying special attention to the area where your surgery will be performed.  Thoroughly rinse your body with warm water from the neck down.  DO NOT shower/wash with your normal soap after using and rinsing off the CHG Soap.  Pat yourself dry with a CLEAN TOWEL.  Wear CLEAN PAJAMAS to bed the night before surgery  Place CLEAN SHEETS on your bed the night before your surgery  DO NOT SLEEP WITH PETS.   Day of Surgery:  Take a shower with CHG soap. Wear Clean/Comfortable clothing the morning of surgery Do not apply any  deodorants/lotions.   Remember to brush your teeth WITH YOUR REGULAR TOOTHPASTE.    If you received a COVID test during your pre-op visit, it is requested that you wear a mask when out in public, stay away from anyone that may not be feeling well, and notify your surgeon if you develop symptoms. If you have been in contact with anyone that has tested positive in the last 10 days, please notify your surgeon.    Please read over the following fact sheets that you were given.

## 2022-08-01 ENCOUNTER — Other Ambulatory Visit: Payer: Self-pay

## 2022-08-01 ENCOUNTER — Encounter (HOSPITAL_COMMUNITY): Payer: Self-pay

## 2022-08-01 ENCOUNTER — Encounter (HOSPITAL_COMMUNITY)
Admission: RE | Admit: 2022-08-01 | Discharge: 2022-08-01 | Disposition: A | Discharge status: Home / Self Care | Payer: Medicare Other | Source: Ambulatory Visit | Attending: Otolaryngology | Admitting: Otolaryngology

## 2022-08-01 VITALS — BP 145/70 | HR 71 | Temp 98.0°F | Resp 17 | Ht 63.0 in | Wt 185.1 lb

## 2022-08-01 DIAGNOSIS — E119 Type 2 diabetes mellitus without complications: Secondary | ICD-10-CM | POA: Diagnosis not present

## 2022-08-01 DIAGNOSIS — Z01818 Encounter for other preprocedural examination: Secondary | ICD-10-CM | POA: Insufficient documentation

## 2022-08-01 HISTORY — DX: Personal history of other diseases of the digestive system: Z87.19

## 2022-08-01 HISTORY — DX: Fatty (change of) liver, not elsewhere classified: K76.0

## 2022-08-01 LAB — COMPREHENSIVE METABOLIC PANEL
ALT: 18 U/L (ref 0–44)
AST: 17 U/L (ref 15–41)
Albumin: 3.5 g/dL (ref 3.5–5.0)
Alkaline Phosphatase: 57 U/L (ref 38–126)
Anion gap: 9 (ref 5–15)
BUN: 15 mg/dL (ref 8–23)
CO2: 28 mmol/L (ref 22–32)
Calcium: 9.5 mg/dL (ref 8.9–10.3)
Chloride: 104 mmol/L (ref 98–111)
Creatinine, Ser: 0.92 mg/dL (ref 0.44–1.00)
GFR, Estimated: >60 mL/min (ref >60)
Glucose, Bld: 106 mg/dL — ABNORMAL HIGH (ref 70–99)
Potassium: 4.2 mmol/L (ref 3.5–5.1)
Sodium: 141 mmol/L (ref 135–145)
Total Bilirubin: 0.6 mg/dL (ref 0.3–1.2)
Total Protein: 6.1 g/dL — ABNORMAL LOW (ref 6.5–8.1)

## 2022-08-01 LAB — CBC
HCT: 43.1 % (ref 36.0–46.0)
Hemoglobin: 13.9 g/dL (ref 12.0–15.0)
MCH: 29.4 pg (ref 26.0–34.0)
MCHC: 32.3 g/dL (ref 30.0–36.0)
MCV: 91.3 fL (ref 80.0–100.0)
Platelets: 143 10*3/uL — ABNORMAL LOW (ref 150–400)
RBC: 4.72 MIL/uL (ref 3.87–5.11)
RDW: 14 % (ref 11.5–15.5)
WBC: 6.8 10*3/uL (ref 4.0–10.5)
nRBC: 0 % (ref 0.0–0.2)

## 2022-08-01 LAB — HEMOGLOBIN A1C
Hgb A1c MFr Bld: 7.6 % — ABNORMAL HIGH (ref 4.8–5.6)
Mean Plasma Glucose: 171.42 mg/dL

## 2022-08-01 LAB — GLUCOSE, CAPILLARY: Glucose-Capillary: 121 mg/dL — ABNORMAL HIGH (ref 70–99)

## 2022-08-01 NOTE — Pre-Procedure Instructions (Signed)
Surgical Instructions    Your procedure is scheduled on Friday 08/10/22.   Report to [facility] Main Entrance "A" at 05:30 A.M., then check in with the Admitting office.  Call this number if you have problems the morning of surgery:  [phone]   If you have any questions prior to your surgery date call [phone]: Open Monday-Friday 8am-4pm If you experience any cold or flu symptoms such as cough, fever, chills, shortness of breath, etc. between now and your scheduled surgery, please notify us at the above number     Remember:  Do not eat after midnight the night before your surgery  You may drink clear liquids until 04:30 A.M. the morning of your surgery.   Clear liquids allowed are: Water, Non-Citrus Juices (without pulp), Carbonated Beverages, Clear Tea, Black Coffee ONLY (NO MILK, CREAM OR POWDERED CREAMER of any kind), and Gatorade     Take these medicines the morning of surgery with A SIP OF WATER:   amLODipine (NORVASC)  atorvastatin (LIPITOR)  carvedilol (COREG)   SYNTHROID    Take these medicines if needed:   acetaminophen (TYLENOL)   clorazepate (TRANXENE)  fluticasone (FLONASE)  HYDROcodone-acetaminophen (NORCO)  loratadine (CLARITIN)  nitroGLYCERIN (NITROSTAT)  omeprazole (PRILOSEC OTC)  Polyvinyl Alcohol-Povidone (REFRESH OP)   Follow your surgeon's instructions on when to stop Aspirin.  If no instructions were given by your surgeon then you will need to call the office to get those instructions.    As of today, STOP taking any Aleve, Naproxen, Ibuprofen, Motrin, Advil, Goody's, BC's, all herbal medications, fish oil, and all vitamins.    WHAT DO I DO ABOUT MY DIABETES MEDICATION?   DO NOT TAKE glimepiride (AMARYL) the morning of surgery.    HOW TO MANAGE YOUR DIABETES BEFORE AND AFTER SURGERY  Why is it important to control my blood sugar before and after surgery? Improving blood sugar levels before and after surgery helps healing and can limit  problems. A way of improving blood sugar control is eating a healthy diet by:  Eating less sugar and carbohydrates  Increasing activity/exercise  Talking with your doctor about reaching your blood sugar goals High blood sugars (greater than 180 mg/dL) can raise your risk of infections and slow your recovery, so you will need to focus on controlling your diabetes during the weeks before surgery. Make sure that the doctor who takes care of your diabetes knows about your planned surgery including the date and location.  How do I manage my blood sugar before surgery? Check your blood sugar at least 4 times a day, starting 2 days before surgery, to make sure that the level is not too high or low.  Check your blood sugar the morning of your surgery when you wake up and every 2 hours until you get to the Short Stay unit.  If your blood sugar is less than 70 mg/dL, you will need to treat for low blood sugar: Do not take insulin. Treat a low blood sugar (less than 70 mg/dL) with  cup of clear juice (cranberry or apple), 4 glucose tablets, OR glucose gel. Recheck blood sugar in 15 minutes after treatment (to make sure it is greater than 70 mg/dL). If your blood sugar is not greater than 70 mg/dL on recheck, call [phone] for further instructions. Report your blood sugar to the short stay nurse when you get to Short Stay.  If you are admitted to the hospital after surgery: Your blood sugar will be checked by the staff  and you will probably be given insulin after surgery (instead of oral diabetes medicines) to make sure you have good blood sugar levels. The goal for blood sugar control after surgery is 80-180 mg/dL.            Do not wear jewelry or makeup. Do not wear lotions, powders, perfumes/cologne or deodorant. Do not shave 48 hours prior to surgery.  Men may shave face and neck. Do not bring valuables to the hospital. Do not wear nail polish, gel polish, artificial nails, or any other  type of covering on natural nails (fingers and toes) If you have artificial nails or gel coating that need to be removed by a nail salon, please have this removed prior to surgery. Artificial nails or gel coating may interfere with anesthesia's ability to adequately monitor your vital signs.  [place] is not responsible for any belongings or valuables.    Do NOT Smoke (Tobacco/Vaping)  24 hours prior to your procedure  If you use a CPAP at night, you may bring your mask for your overnight stay.   Contacts, glasses, hearing aids, dentures or partials may not be worn into surgery, please bring cases for these belongings   For patients admitted to the hospital, discharge time will be determined by your treatment team.   Patients discharged the day of surgery will not be allowed to drive home, and someone needs to stay with them for 24 hours.   SURGICAL WAITING ROOM VISITATION Patients having surgery or a procedure may have no more than 2 support people in the waiting area - these visitors may rotate.   Children under the age of 14 must have an adult with them who is not the patient. If the patient needs to stay at the hospital during part of their recovery, the visitor guidelines for inpatient rooms apply. Pre-op nurse will coordinate an appropriate time for 1 support person to accompany patient in pre-op.  This support person may not rotate.   Please refer to [email or web address] for the visitor guidelines for Inpatients (after your surgery is over and you are in a regular room).    Special instructions:    Oral Hygiene is also important to reduce your risk of infection.  Remember - BRUSH YOUR TEETH THE MORNING OF SURGERY WITH YOUR REGULAR TOOTHPASTE   [place]- Preparing For Surgery  Before surgery, you can play an important role. Because skin is not sterile, your skin needs to be as free of germs as possible. You can reduce the  number of germs on your skin by washing with CHG (chlorahexidine gluconate) Soap before surgery.  CHG is an antiseptic cleaner which kills germs and bonds with the skin to continue killing germs even after washing.     Please do not use if you have an allergy to CHG or antibacterial soaps. If your skin becomes reddened/irritated stop using the CHG.  Do not shave (including legs and underarms) for at least 48 hours prior to first CHG shower. It is OK to shave your face.  Please follow these instructions carefully.     Shower the NIGHT BEFORE SURGERY and the MORNING OF SURGERY with CHG Soap.   If you chose to wash your hair, wash your hair first as usual with your normal shampoo. After you shampoo, rinse your hair and body thoroughly to remove the shampoo.  Then [employer] and genitals (private parts) with your normal soap and rinse thoroughly to remove soap.  After that Use CHG  Soap as you would any other liquid soap. You can apply CHG directly to the skin and wash gently with a scrungie or a clean washcloth.   Apply the CHG Soap to your body ONLY FROM THE NECK DOWN.  Do not use on open wounds or open sores. Avoid contact with your eyes, ears, mouth and genitals (private parts). Wash Face and genitals (private parts)  with your normal soap.   Wash thoroughly, paying special attention to the area where your surgery will be performed.  Thoroughly rinse your body with warm water from the neck down.  DO NOT shower/wash with your normal soap after using and rinsing off the CHG Soap.  Pat yourself dry with a CLEAN TOWEL.  Wear CLEAN PAJAMAS to bed the night before surgery  Place CLEAN SHEETS on your bed the night before your surgery  DO NOT SLEEP WITH PETS.   Day of Surgery:  Take a shower with CHG soap. Wear Clean/Comfortable clothing the morning of surgery Do not apply any deodorants/lotions.   Remember to brush your teeth WITH YOUR REGULAR TOOTHPASTE.    If you received a COVID  test during your pre-op visit, it is requested that you wear a mask when out in public, stay away from anyone that may not be feeling well, and notify your surgeon if you develop symptoms. If you have been in contact with anyone that has tested positive in the last 10 days, please notify your surgeon.    Please read over the following fact sheets that you were given.

## 2022-08-01 NOTE — Progress Notes (Signed)
PCP - Dr. Seward Carol Cardiologist - Dr. Quay Burow  PPM/ICD - Denies Device Orders - n/a Rep Notified - n/a  Chest x-ray - n/a EKG - 03/30/2022 Stress Test - 2013 ECHO - 12/11/2018 Cardiac Cath - 12/10/2018  Sleep Study - Denies CPAP - n/a  Pt is DM2. She checks her blood sugar 1x/day first thing in the morning. Result varies based on what she had for dinner the night before, but normal range is 115-130. CBG at PAT appointment was 121. She had 1/2 cup of black coffee with cream and sugar, corn flakes, and whole milk for breakfast.  Last dose of GLP1 agonist- n/a GLP1 instructions: n/a  Blood Thinner Instructions: n/a Aspirin Instructions: Per surgeons instructions, she has stopped taking her ASA. Her last dose was 10/31.  ERAS Protcol - Yes. Clear liquids until 0430 morning of surgery. PRE-SURGERY Ensure or G2- n/a. None ordered  COVID TEST- n/a   Anesthesia review: Yes. Cardiac Clearance note in Epic 07/17/2022   Patient denies shortness of breath, fever, cough and chest pain at PAT appointment   All instructions explained to the patient, with a verbal understanding of the material. Patient agrees to go over the instructions while at home for a better understanding. Patient also instructed to self quarantine after being tested for COVID-19. The opportunity to ask questions was provided.

## 2022-08-02 NOTE — Progress Notes (Signed)
Anesthesia Chart Review:  Case: [id number] Date/Time: 08/10/22 0715   Procedure: EXCISION NECK TUMOR (Left)   Anesthesia type: General   Pre-op diagnosis: Mass of left side of neck   Location: [facility] OR ROOM 09 / [facility]   Surgeons: Jenetta Downer, MD       DISCUSSION: Patient is an 81 year old female scheduled for the above procedure.  History includes never smoker, HTN, HLD, DM2, CAD (inferior STEMI 12/10/18 with severe 2V CAD, culprit lesion acute on chronic RCA occlusion and unable to cross, s/p DES to pLAD & DES mLAD 12/10/18),  ischemic cardiomyopathy (LVEF 40-45% 11/2018), GERD, hiatal hernia, hypothyroidism, BPPV, osteoarthritis (right TKA 06/11/17; left TKA 08/19/18), fatty liver, skin cancer.   Preoperative telephonic CV risk assessment done on 07/17/22 by Diona Browner, NP, "Preoperative Cardiovascular Risk Assessment: According to the Revised Cardiac Risk Index (RCRI), her Perioperative Risk of Major Cardiac Event is (%): 0.9 . Her Functional Capacity in METs is: 5.07 according to the Duke Activity Status Index (DASI).Therefore, based on ACC/AHA guidelines, patient would be at acceptable risk for the planned procedure without further cardiovascular testing... Ideally aspirin should be continued without interruption, however if the bleeding risk is too great, aspirin may be held for 5-7 days prior to surgery. Please resume aspirin post operatively when it is felt to be safe from a bleeding standpoint."  Anesthesia team to evaluate on the day of surgery.    VS: BP (!) 145/70   Pulse 71   Temp 36.7 C   Resp 17   Ht '5\' 3"'$  (1.6 m)   Wt 84 kg   SpO2 99%   BMI 32.79 kg/m   PROVIDERS: Seward Carol, MD is PCP Quay Burow, MD is cardiologist   LABS: Labs reviewed: Acceptable for surgery. (all labs ordered are listed, but only abnormal results are displayed)  Labs Reviewed  GLUCOSE, CAPILLARY - Abnormal; Notable for the following components:      Result Value   Glucose-Capillary  121 (*)    All other components within normal limits  HEMOGLOBIN A1C - Abnormal; Notable for the following components:   Hgb A1c MFr Bld 7.6 (*)    All other components within normal limits  COMPREHENSIVE METABOLIC PANEL - Abnormal; Notable for the following components:   Glucose, Bld 106 (*)    Total Protein 6.1 (*)    All other components within normal limits  CBC - Abnormal; Notable for the following components:   Platelets 143 (*)    All other components within normal limits     IMAGES: CT Soft Tissue Neck 05/17/22: IMPRESSION: - Palpable complaint reflects a 2.1 cm cystic mass in the left mid neck, often a necrotic node. Recommend ENT referral for tumor workup. - Long-standing right thyroid nodule which has been previously biopsied.    EKG: 03/30/22: Normal sinus rhythm Anterior infarct, age undetermined   CV: Echo 12/11/18: IMPRESSIONS   1. Moderate hypokinesis of the left ventricular, basal-mid inferior wall.   2. The left ventricle has mildly reduced systolic function, with an  ejection fraction of 45-50%. The cavity size was normal. There is mildly  increased left ventricular wall thickness. Left ventricular diastolic  Doppler parameters are consistent with  impaired relaxation.   3. The right ventricle has normal systolic function. The cavity was  normal. There is no increase in right ventricular wall thickness.   4. The aortic valve is tricuspid Moderate thickening of the aortic valve  Moderate calcification of the aortic valve.  Cardiac cath/PCI 12/10/18: Prox RCA to Mid RCA lesion is 100% stenosed. Prox LAD lesion is 99% stenosed. Post intervention, there is a 0% residual stenosis. A drug-eluting stent was successfully placed using a STENT RESOLUTE ONYX 2.5X15. Ost 1st Diag lesion is 40% stenosed. Mid LAD lesion is 80% stenosed. Post intervention, there is a 5% residual stenosis. A drug-eluting stent was successfully placed using a STENT RESOLUTE ONYX  2.0X18. Mid Cx lesion is 50% stenosed. LV end diastolic pressure is mildly elevated. The left ventricular ejection fraction is 35-45% by visual estimate. There is moderate left ventricular systolic dysfunction.   1.  Severe two-vessel coronary artery disease.  The patient presented with inferior ST elevation myocardial infarction with symptoms that started about 2 weeks prior to presentation.  The culprit for her STEMI is likely an acute on chronic occlusion in the RCA.  There is also high-grade stenosis in the proximal LAD and mid LAD.  Moderate disease in the left circumflex. 2.  Moderately reduced LV systolic function with an EF of 35 to 40%.  Mildly elevated left ventricular end-diastolic pressure. 3. Inability to cross the occlusion in the right coronary artery. 4. Successful angioplasty and drug-eluting stent placement to the LAD x2.   Recommendations: Dual antiplatelet therapy for at least 1 year. The patient will likely finish an inferior infarct but she has reasonable left-to-right collaterals. The patient's chest pain improved at the end of the procedure but as expected, did not resolve and thus I elected to start her on nitroglycerin drip.  She also continued to have inferior ST elevation in her EKG and this is also expected.  Past Medical History:  Diagnosis Date   BPPV (benign paroxysmal positional vertigo), left    Cancer (HCC)    Skin Cancer. Areas removed by Dermatologist   Depression    Family history of heart disease    Fatty liver    GERD (gastroesophageal reflux disease)    History of hiatal hernia    History of nuclear stress test 05/01/2012   dr berry   normal lexiscan nuclear study w/ no ischemia/  normal LV function and wall motion , ef 73%   Hyperlipidemia    Hypertension    Hypothyroidism    Myocardial infarction ([place]) 2020   OA (osteoarthritis)    Type 2 diabetes mellitus ([place])    Wears contact lenses    left eye only   Wears partial dentures    upper     Past Surgical History:  Procedure Laterality Date   CARPAL TUNNEL RELEASE Bilateral 2018   CATARACT EXTRACTION W/ INTRAOCULAR LENS  IMPLANT, BILATERAL  06/2018   CESAREAN SECTION  1969;  1972   COLONOSCOPY  2013   CORONARY/GRAFT ACUTE MI REVASCULARIZATION N/A 12/10/2018   Procedure: CORONARY/GRAFT ACUTE MI REVASCULARIZATION;  Surgeon: Wellington Hampshire, MD;  Location: [facility];  Service: Cardiovascular;  Laterality: N/A;   CYSTECTOMY Left    sebaceous cyst on breast   LEFT HEART CATH AND CORONARY ANGIOGRAPHY N/A 12/10/2018   Procedure: LEFT HEART CATH AND CORONARY ANGIOGRAPHY;  Surgeon: Wellington Hampshire, MD;  Location: [facility];  Service: Cardiovascular;  Laterality: N/A;   TOTAL KNEE ARTHROPLASTY Right 06/11/2017   Procedure: RIGHT TOTAL KNEE ARTHROPLASTY;  Surgeon: Paralee Cancel, MD;  Location: [facility];  Service: Orthopedics;  Laterality: Right;  70 mins   TOTAL KNEE ARTHROPLASTY Left 08/19/2018   Procedure: TOTAL KNEE ARTHROPLASTY;  Surgeon: Paralee Cancel, MD;  Location: [facility];  Service: Orthopedics;  Laterality: Left;    MEDICATIONS:  acetaminophen (TYLENOL) 500 MG tablet   amLODipine (NORVASC) 5 MG tablet   Ascorbic Acid (VITAMIN C PO)   aspirin 81 MG chewable tablet   atorvastatin (LIPITOR) 80 MG tablet   carvedilol (COREG) 25 MG tablet   clorazepate (TRANXENE) 7.5 MG tablet   Cyanocobalamin (VITAMIN B12) 1000 MCG TBCR   fluticasone (FLONASE) 50 MCG/ACT nasal spray   furosemide (LASIX) 20 MG tablet   glimepiride (AMARYL) 2 MG tablet   HYDROcodone-acetaminophen (NORCO/VICODIN) 5-325 MG tablet   levothyroxine (SYNTHROID) 100 MCG tablet   lisinopril (ZESTRIL) 40 MG tablet   liver oil-zinc oxide (DESITIN) 40 % ointment   loratadine (CLARITIN) 10 MG tablet   Multiple Vitamins-Minerals (MULTIVITAMIN WITH MINERALS) tablet   nitroGLYCERIN (NITROSTAT) 0.4 MG SL tablet   omeprazole (PRILOSEC OTC) 20 MG tablet   Polyvinyl Alcohol-Povidone (REFRESH OP)    No current facility-administered medications for this encounter.    Myra Gianotti, PA-C Surgical Short Stay/Anesthesiology [facility] Phone [phone] [facility] Phone [phone] 08/02/2022 5:19 PM

## 2022-08-02 NOTE — Anesthesia Preprocedure Evaluation (Addendum)
Anesthesia Evaluation  Patient identified by MRN, date of birth, ID band Patient awake    Reviewed: Allergy & Precautions, NPO status , Patient's Chart, lab work & pertinent test results, reviewed documented beta blocker date and time   History of Anesthesia Complications Negative for: history of anesthetic complications  Airway Mallampati: III  TM Distance: <3 FB Neck ROM: Full    Dental  (+) Dental Advisory Given, Partial Upper   Pulmonary neg pulmonary ROS   Pulmonary exam normal        Cardiovascular hypertension, Pt. on medications and Pt. on home beta blockers + CAD, + Past MI and + Cardiac Stents  Normal cardiovascular exam   '20 TTE - Moderate hypokinesis of the left ventricular, basal-mid inferior wall. EF of 45-50%. There is mildly increased left ventricular wall thickness. Left ventricular diastolic Doppler parameters are consistent with impaired relaxation. No significant valve problems.     Neuro/Psych  PSYCHIATRIC DISORDERS  Depression    negative neurological ROS     GI/Hepatic Neg liver ROS, hiatal hernia,GERD  Medicated and Controlled,,  Endo/Other  diabetes, Type 2, Oral Hypoglycemic AgentsHypothyroidism    Renal/GU negative Renal ROS     Musculoskeletal  (+) Arthritis , Osteoarthritis,    Abdominal   Peds  Hematology  Plt 143k    Anesthesia Other Findings   Reproductive/Obstetrics                             Anesthesia Physical Anesthesia Plan  ASA: 3  Anesthesia Plan: General   Post-op Pain Management: Tylenol PO (pre-op)* and Minimal or no pain anticipated   Induction: Intravenous  PONV Risk Score and Plan: 3 and Treatment may vary due to age or medical condition, Ondansetron and Propofol infusion  Airway Management Planned: Oral ETT  Additional Equipment: None  Intra-op Plan:   Post-operative Plan: Extubation in OR  Informed Consent: I have reviewed  the patients History and Physical, chart, labs and discussed the procedure including the risks, benefits and alternatives for the proposed anesthesia with the patient or authorized representative who has indicated his/her understanding and acceptance.     Dental advisory given  Plan Discussed with: CRNA and Anesthesiologist  Anesthesia Plan Comments: ( )       Anesthesia Quick Evaluation

## 2022-08-09 NOTE — H&P (Signed)
Desiree Rasmussen is an 81 y.o. female.    Chief Complaint:  Left neck mass  HPI: Patient presents today for planned elective procedure.  He/she denies any interval change in history since office visit on 07/31/22.  Past Medical History:  Diagnosis Date   BPPV (benign paroxysmal positional vertigo), left    Cancer (HCC)    Skin Cancer. Areas removed by Dermatologist   Depression    Family history of heart disease    Fatty liver    GERD (gastroesophageal reflux disease)    History of hiatal hernia    History of nuclear stress test 05/01/2012   dr berry   normal lexiscan nuclear study w/ no ischemia/  normal LV function and wall motion , ef 73%   Hyperlipidemia    Hypertension    Hypothyroidism    Myocardial infarction ([place]) 2020   OA (osteoarthritis)    Type 2 diabetes mellitus ([place])    Wears contact lenses    left eye only   Wears partial dentures    upper    Past Surgical History:  Procedure Laterality Date   CARPAL TUNNEL RELEASE Bilateral 2018   CATARACT EXTRACTION W/ INTRAOCULAR LENS  IMPLANT, BILATERAL  06/2018   CESAREAN SECTION  1969;  1972   COLONOSCOPY  2013   CORONARY/GRAFT ACUTE MI REVASCULARIZATION N/A 12/10/2018   Procedure: CORONARY/GRAFT ACUTE MI REVASCULARIZATION;  Surgeon: Wellington Hampshire, MD;  Location: [facility];  Service: Cardiovascular;  Laterality: N/A;   CYSTECTOMY Left    sebaceous cyst on breast   LEFT HEART CATH AND CORONARY ANGIOGRAPHY N/A 12/10/2018   Procedure: LEFT HEART CATH AND CORONARY ANGIOGRAPHY;  Surgeon: Wellington Hampshire, MD;  Location: [facility];  Service: Cardiovascular;  Laterality: N/A;   TOTAL KNEE ARTHROPLASTY Right 06/11/2017   Procedure: RIGHT TOTAL KNEE ARTHROPLASTY;  Surgeon: Paralee Cancel, MD;  Location[facility];  Service: Orthopedics;  Laterality: Right;  70 mins   TOTAL KNEE ARTHROPLASTY Left 08/19/2018   Procedure: TOTAL KNEE ARTHROPLASTY;  Surgeon: Paralee Cancel, MD;  Location: [facility];  Service:  Orthopedics;  Laterality: Left;    Family History  Problem Relation Age of Onset   Colon cancer Mother 53   Heart disease Father 102       heart attack   Breast cancer Maternal Aunt 75   Colon cancer Maternal Uncle    Heart disease Brother        CABG at 37    Social History:  reports that she has never smoked. She has never used smokeless tobacco. She reports that she does not drink alcohol and does not use drugs.  Allergies:  Allergies  Allergen Reactions   Lexapro [Escitalopram Oxalate] Diarrhea   Crestor [Rosuvastatin]     Muscle and Joint Pain    Doxycycline Nausea And Vomiting    Upset stomach, also   Erythromycin Other (See Comments)    "upset stomach"   Hydrocodone Other (See Comments)    Affected mood negatively at 7.5-325 mg strength (tolerates 5-325)   Macrobid [Nitrofurantoin] Other (See Comments)    Didn't work   Sulfa Antibiotics Rash   Zoloft [Sertraline Hcl] Palpitations    Medications Prior to Admission  Medication Sig Dispense Refill   amLODipine (NORVASC) 5 MG tablet Take 5 mg by mouth daily.     Ascorbic Acid (VITAMIN C PO) Take 500 mg by mouth every other day. Alternates days with multivitamin     aspirin 81 MG chewable  tablet Chew 81 mg by mouth daily.     atorvastatin (LIPITOR) 80 MG tablet Take 40 mg by mouth daily.     carvedilol (COREG) 25 MG tablet Take 25 mg by mouth 2 (two) times daily with a meal.     clorazepate (TRANXENE) 7.5 MG tablet Take 7.5 mg by mouth 2 (two) times daily as needed for anxiety.     Cyanocobalamin (VITAMIN B12) 1000 MCG TBCR Take 1,000 mcg by mouth daily.     fluticasone (FLONASE) 50 MCG/ACT nasal spray Place 2 sprays into both nostrils daily as needed for allergies or rhinitis.     furosemide (LASIX) 20 MG tablet Take 20 mg by mouth daily as needed for edema.     glimepiride (AMARYL) 2 MG tablet Take 2 mg by mouth daily with breakfast.     levothyroxine (SYNTHROID) 100 MCG tablet Take 100 mcg by mouth daily before  breakfast.     lisinopril (ZESTRIL) 40 MG tablet Take 40 mg by mouth daily.     liver oil-zinc oxide (DESITIN) 40 % ointment Apply 1 application topically as needed for irritation (UNDER THE BREASTS AND BETWEEN SKIN FOLDS).      loratadine (CLARITIN) 10 MG tablet Take 10 mg by mouth daily as needed for allergies or rhinitis.     Multiple Vitamins-Minerals (MULTIVITAMIN WITH MINERALS) tablet Take 1 tablet by mouth every other day. Alternates with vitamin c     omeprazole (PRILOSEC OTC) 20 MG tablet Take 20 mg by mouth daily as needed (acid reflux).     Polyvinyl Alcohol-Povidone (REFRESH OP) Place 1 drop into both eyes daily as needed (dry eyes).     nitroGLYCERIN (NITROSTAT) 0.4 MG SL tablet PLACE 1 TABLET UNDER TONGUE EVERY 5 MINUTES AS NEEDED FOR CHEST PAIN 25 tablet 2    Results for orders placed or performed during the hospital encounter of 08/10/22 (from the past 48 hour(s))  Glucose, capillary     Status: Abnormal   Collection Time: 08/10/22  5:54 AM  Result Value Ref Range   Glucose-Capillary 161 (H) 70 - 99 mg/dL    Comment: Glucose reference range applies only to samples taken after fasting for at least 8 hours.   No results found.  ROS: negative other than stated in HPI  Blood pressure 101/73, pulse 74, temperature 97.8 F (36.6 C), temperature source Oral, resp. rate 17, height '5\' 3"'$  (1.6 m), weight 83.9 kg, SpO2 95 %.  PHYSICAL EXAM: General: Resting comfortably in NAD  Lungs: Non-labored respiratinos  Studies Reviewed: CT neck. Biopsy result.    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  Discussed R/B/A including risks of pain, bleeding, infection, scarring, numbness, injury to CN XI, XII, VII, injury to great vessels ,risks of anesthesia. Despite these risks patient requesting to proceed with surgery.   Electronically signed by:  Jenetta Downer, MD  Staff Physician [facility] Plastic [facility] Reconstructive Surgery Otolaryngology - Head and Neck  Sur[facility], [place]  08/10/2022, 7:10 AM

## 2022-08-10 ENCOUNTER — Other Ambulatory Visit: Payer: Self-pay

## 2022-08-10 ENCOUNTER — Ambulatory Visit (HOSPITAL_BASED_OUTPATIENT_CLINIC_OR_DEPARTMENT_OTHER): Payer: Medicare Other | Admitting: Anesthesiology

## 2022-08-10 ENCOUNTER — Ambulatory Visit (HOSPITAL_COMMUNITY): Payer: Medicare Other | Admitting: Vascular Surgery

## 2022-08-10 ENCOUNTER — Encounter (HOSPITAL_COMMUNITY)
Admission: RE | Disposition: A | Discharge status: Home / Self Care | Payer: Self-pay | Source: Home / Self Care | Attending: Otolaryngology

## 2022-08-10 ENCOUNTER — Ambulatory Visit (HOSPITAL_COMMUNITY)
Admission: RE | Admit: 2022-08-10 | Discharge: 2022-08-10 | Disposition: A | Discharge status: Home / Self Care | Payer: Medicare Other | Attending: Otolaryngology | Admitting: Otolaryngology

## 2022-08-10 DIAGNOSIS — K219 Gastro-esophageal reflux disease without esophagitis: Secondary | ICD-10-CM | POA: Insufficient documentation

## 2022-08-10 DIAGNOSIS — C4442 Squamous cell carcinoma of skin of scalp and neck: Secondary | ICD-10-CM | POA: Insufficient documentation

## 2022-08-10 DIAGNOSIS — E119 Type 2 diabetes mellitus without complications: Secondary | ICD-10-CM

## 2022-08-10 DIAGNOSIS — I2510 Atherosclerotic heart disease of native coronary artery without angina pectoris: Secondary | ICD-10-CM | POA: Diagnosis not present

## 2022-08-10 DIAGNOSIS — I252 Old myocardial infarction: Secondary | ICD-10-CM

## 2022-08-10 DIAGNOSIS — R221 Localized swelling, mass and lump, neck: Secondary | ICD-10-CM

## 2022-08-10 DIAGNOSIS — Z7984 Long term (current) use of oral hypoglycemic drugs: Secondary | ICD-10-CM | POA: Diagnosis not present

## 2022-08-10 DIAGNOSIS — I10 Essential (primary) hypertension: Secondary | ICD-10-CM | POA: Diagnosis not present

## 2022-08-10 DIAGNOSIS — C490 Malignant neoplasm of connective and soft tissue of head, face and neck: Secondary | ICD-10-CM | POA: Diagnosis not present

## 2022-08-10 HISTORY — PX: EXCISION MASS NECK: SHX6703

## 2022-08-10 LAB — GLUCOSE, CAPILLARY
Glucose-Capillary: 161 mg/dL — ABNORMAL HIGH (ref 70–99)
Glucose-Capillary: 162 mg/dL — ABNORMAL HIGH (ref 70–99)

## 2022-08-10 SURGERY — EXCISION, MASS, NECK
Anesthesia: General | Site: Neck | Laterality: Left

## 2022-08-10 MED ORDER — ONDANSETRON HCL 4 MG/2ML IJ SOLN
INTRAMUSCULAR | Status: AC
  Filled 2022-08-10: qty 2

## 2022-08-10 MED ORDER — HYDROCODONE-ACETAMINOPHEN 5-325 MG PO TABS: 1.0000 | ORAL_TABLET | Freq: Four times a day (QID) | ORAL | 0 refills | Status: AC | PRN

## 2022-08-10 MED ORDER — OXYCODONE HCL 5 MG/5ML PO SOLN: 5.0000 mg | Freq: Once | ORAL | Status: DC | PRN

## 2022-08-10 MED ORDER — FENTANYL CITRATE (PF) 100 MCG/2ML IJ SOLN
INTRAMUSCULAR | Status: DC | PRN
  Administered 2022-08-10 (×2): 50 ug via INTRAVENOUS

## 2022-08-10 MED ORDER — PHENYLEPHRINE 80 MCG/ML (10ML) SYRINGE FOR IV PUSH (FOR BLOOD PRESSURE SUPPORT)
PREFILLED_SYRINGE | INTRAVENOUS | Status: AC
  Filled 2022-08-10: qty 10

## 2022-08-10 MED ORDER — LIDOCAINE-EPINEPHRINE 1 %-1:100000 IJ SOLN
INTRAMUSCULAR | Status: AC
  Filled 2022-08-10: qty 1

## 2022-08-10 MED ORDER — SUGAMMADEX SODIUM 200 MG/2ML IV SOLN
INTRAVENOUS | Status: DC | PRN
  Administered 2022-08-10: 200 mg via INTRAVENOUS

## 2022-08-10 MED ORDER — LIDOCAINE 2% (20 MG/ML) 5 ML SYRINGE
INTRAMUSCULAR | Status: DC | PRN
  Administered 2022-08-10: 60 mg via INTRAVENOUS

## 2022-08-10 MED ORDER — PHENYLEPHRINE 80 MCG/ML (10ML) SYRINGE FOR IV PUSH (FOR BLOOD PRESSURE SUPPORT)
PREFILLED_SYRINGE | INTRAVENOUS | Status: DC | PRN
  Administered 2022-08-10 (×4): 80 ug via INTRAVENOUS
  Administered 2022-08-10: 40 ug via INTRAVENOUS
  Administered 2022-08-10: 80 ug via INTRAVENOUS

## 2022-08-10 MED ORDER — FENTANYL CITRATE (PF) 100 MCG/2ML IJ SOLN
INTRAMUSCULAR | Status: AC
  Filled 2022-08-10: qty 2

## 2022-08-10 MED ORDER — ACETAMINOPHEN 500 MG PO TABS
1000.0000 mg | ORAL_TABLET | Freq: Once | ORAL | Status: AC
  Administered 2022-08-10: 1000 mg via ORAL
  Filled 2022-08-10: qty 2

## 2022-08-10 MED ORDER — MIDAZOLAM HCL 2 MG/2ML IJ SOLN
INTRAMUSCULAR | Status: AC
  Filled 2022-08-10: qty 2

## 2022-08-10 MED ORDER — DEXAMETHASONE SODIUM PHOSPHATE 10 MG/ML IJ SOLN
INTRAMUSCULAR | Status: AC
  Filled 2022-08-10: qty 1

## 2022-08-10 MED ORDER — LACTATED RINGERS IV SOLN: INTRAVENOUS | Status: DC

## 2022-08-10 MED ORDER — BACITRACIN ZINC 500 UNIT/GM EX OINT
TOPICAL_OINTMENT | CUTANEOUS | Status: AC
  Filled 2022-08-10: qty 28.35

## 2022-08-10 MED ORDER — CEFAZOLIN SODIUM-DEXTROSE 2-4 GM/100ML-% IV SOLN
2.0000 g | INTRAVENOUS | Status: AC
  Administered 2022-08-10: 2 g via INTRAVENOUS
  Filled 2022-08-10: qty 100

## 2022-08-10 MED ORDER — INSULIN ASPART 100 UNIT/ML IJ SOLN: 0.0000 [IU] | INTRAMUSCULAR | Status: DC | PRN

## 2022-08-10 MED ORDER — LIDOCAINE 2% (20 MG/ML) 5 ML SYRINGE
INTRAMUSCULAR | Status: AC
  Filled 2022-08-10: qty 5

## 2022-08-10 MED ORDER — EPHEDRINE 5 MG/ML INJ
INTRAVENOUS | Status: AC
  Filled 2022-08-10: qty 5

## 2022-08-10 MED ORDER — BACITRACIN ZINC 500 UNIT/GM EX OINT
TOPICAL_OINTMENT | CUTANEOUS | Status: DC | PRN
  Administered 2022-08-10: 1 via TOPICAL

## 2022-08-10 MED ORDER — MIDAZOLAM HCL 2 MG/2ML IJ SOLN
INTRAMUSCULAR | Status: DC | PRN
  Administered 2022-08-10: 1 mg via INTRAVENOUS

## 2022-08-10 MED ORDER — FENTANYL CITRATE (PF) 100 MCG/2ML IJ SOLN: 25.0000 ug | INTRAMUSCULAR | Status: DC | PRN

## 2022-08-10 MED ORDER — ONDANSETRON HCL 4 MG/2ML IJ SOLN: 4.0000 mg | Freq: Once | INTRAMUSCULAR | Status: DC | PRN

## 2022-08-10 MED ORDER — ORAL CARE MOUTH RINSE: 15.0000 mL | Freq: Once | OROMUCOSAL | Status: AC

## 2022-08-10 MED ORDER — 0.9 % SODIUM CHLORIDE (POUR BTL) OPTIME
TOPICAL | Status: DC | PRN
  Administered 2022-08-10: 1000 mL

## 2022-08-10 MED ORDER — ROCURONIUM BROMIDE 10 MG/ML (PF) SYRINGE
PREFILLED_SYRINGE | INTRAVENOUS | Status: DC | PRN
  Administered 2022-08-10: 60 mg via INTRAVENOUS
  Administered 2022-08-10: 50 mg via INTRAVENOUS

## 2022-08-10 MED ORDER — PROPOFOL 10 MG/ML IV BOLUS
INTRAVENOUS | Status: AC
  Filled 2022-08-10: qty 20

## 2022-08-10 MED ORDER — SUCCINYLCHOLINE CHLORIDE 200 MG/10ML IV SOSY
PREFILLED_SYRINGE | INTRAVENOUS | Status: AC
  Filled 2022-08-10: qty 10

## 2022-08-10 MED ORDER — PROPOFOL 10 MG/ML IV BOLUS
INTRAVENOUS | Status: DC | PRN
  Administered 2022-08-10: 130 mg via INTRAVENOUS

## 2022-08-10 MED ORDER — EPHEDRINE SULFATE-NACL 50-0.9 MG/10ML-% IV SOSY
PREFILLED_SYRINGE | INTRAVENOUS | Status: DC | PRN
  Administered 2022-08-10: 10 mg via INTRAVENOUS

## 2022-08-10 MED ORDER — LIDOCAINE-EPINEPHRINE 1 %-1:100000 IJ SOLN
INTRAMUSCULAR | Status: DC | PRN
  Administered 2022-08-10: 4 mL

## 2022-08-10 MED ORDER — CHLORHEXIDINE GLUCONATE 0.12 % MT SOLN
15.0000 mL | Freq: Once | OROMUCOSAL | Status: AC
  Administered 2022-08-10: 15 mL via OROMUCOSAL
  Filled 2022-08-10: qty 15

## 2022-08-10 MED ORDER — DEXAMETHASONE SODIUM PHOSPHATE 10 MG/ML IJ SOLN
INTRAMUSCULAR | Status: DC | PRN
  Administered 2022-08-10: 5 mg via INTRAVENOUS

## 2022-08-10 MED ORDER — ONDANSETRON HCL 4 MG/2ML IJ SOLN
INTRAMUSCULAR | Status: DC | PRN
  Administered 2022-08-10: 4 mg via INTRAVENOUS

## 2022-08-10 MED ORDER — OXYCODONE HCL 5 MG PO TABS: 5.0000 mg | ORAL_TABLET | Freq: Once | ORAL | Status: DC | PRN

## 2022-08-10 SURGICAL SUPPLY — 39 items
BAG COUNTER SPONGE SURGICOUNT (BAG) ×1 IMPLANT
BLADE SURG 15 STRL LF DISP TIS (BLADE) IMPLANT
BLADE SURG 15 STRL SS (BLADE)
CLEANER TIP ELECTROSURG 2X2 (MISCELLANEOUS) ×1 IMPLANT
CLIP TI MEDIUM 24 (CLIP) IMPLANT
CLIP TI WIDE RED SMALL 24 (CLIP) IMPLANT
CORD BIPOLAR FORCEPS 12FT (ELECTRODE) IMPLANT
COVER SURGICAL LIGHT HANDLE (MISCELLANEOUS) ×1 IMPLANT
DERMABOND ADVANCED .7 DNX12 (GAUZE/BANDAGES/DRESSINGS) ×1 IMPLANT
DRAPE HALF SHEET 40X57 (DRAPES) IMPLANT
DRSG TELFA 3X8 NADH STRL (GAUZE/BANDAGES/DRESSINGS) IMPLANT
ELECT COATED BLADE 2.86 ST (ELECTRODE) ×1 IMPLANT
ELECT REM PT RETURN 9FT ADLT (ELECTROSURGICAL)
ELECTRODE REM PT RTRN 9FT ADLT (ELECTROSURGICAL) IMPLANT
FORCEPS BIPOLAR SPETZLER 8 1.0 (NEUROSURGERY SUPPLIES) IMPLANT
GAUZE 4X4 16PLY ~~LOC~~+RFID DBL (SPONGE) ×1 IMPLANT
GLOVE BIO SURGEON STRL SZ7.5 (GLOVE) ×1 IMPLANT
GLOVE BIOGEL PI IND STRL 8 (GLOVE) ×1 IMPLANT
GOWN STRL REUS W/ TWL LRG LVL3 (GOWN DISPOSABLE) ×1 IMPLANT
GOWN STRL REUS W/ TWL XL LVL3 (GOWN DISPOSABLE) ×1 IMPLANT
GOWN STRL REUS W/TWL LRG LVL3 (GOWN DISPOSABLE) ×1
GOWN STRL REUS W/TWL XL LVL3 (GOWN DISPOSABLE) ×1
KIT BASIN OR (CUSTOM PROCEDURE TRAY) ×1 IMPLANT
KIT TURNOVER KIT B (KITS) ×1 IMPLANT
NDL 27GX1/2 REG BEVEL ECLIP (NEEDLE) ×1 IMPLANT
NEEDLE 27GX1/2 REG BEVEL ECLIP (NEEDLE) ×1 IMPLANT
NS IRRIG 1000ML POUR BTL (IV SOLUTION) ×1 IMPLANT
PAD ARMBOARD 7.5X6 YLW CONV (MISCELLANEOUS) ×2 IMPLANT
PENCIL SMOKE EVACUATOR (MISCELLANEOUS) ×1 IMPLANT
SET WALTER ACTIVATION W/DRAPE (SET/KITS/TRAYS/PACK) IMPLANT
SPONGE INTESTINAL PEANUT (DISPOSABLE) IMPLANT
SUT ETHILON 5 0 PS 2 18 (SUTURE) IMPLANT
SUT SILK 2 0 SH CR/8 (SUTURE) IMPLANT
SUT SILK 3 0 REEL (SUTURE) IMPLANT
SUT VIC AB 3-0 SH 8-18 (SUTURE) IMPLANT
SUT VICRYL 4-0 PS2 18IN ABS (SUTURE) IMPLANT
SYR CONTROL 10ML LL (SYRINGE) ×1 IMPLANT
TOWEL GREEN STERILE (TOWEL DISPOSABLE) ×1 IMPLANT
TRAY ENT MC OR (CUSTOM PROCEDURE TRAY) ×1 IMPLANT

## 2022-08-10 NOTE — Transfer of Care (Signed)
Immediate Anesthesia Transfer of Care Note  Patient: Desiree Rasmussen  Procedure(s) Performed: EXCISION NECK TUMOR (Left: Neck)  Patient Location: PACU  Anesthesia Type:General  Level of Consciousness: awake, oriented, and patient cooperative  Airway & Oxygen Therapy: Patient Spontanous Breathing and Patient connected to face mask oxygen  Post-op Assessment: Report given to RN and Post -op Vital signs reviewed and stable  Post vital signs: Reviewed and stable  Last Vitals:  Vitals Value Taken Time  BP 116/64 08/10/22 0906  Temp    Pulse 77 08/10/22 0909  Resp 15 08/10/22 0909  SpO2 98 % 08/10/22 0909  Vitals shown include unvalidated device data.  Last Pain:  Vitals:   08/10/22 0645  TempSrc:   PainSc: 0-No pain         Complications: No notable events documented.

## 2022-08-10 NOTE — Discharge Instructions (Signed)
Neck Mass Excision Post Operative Instructions Office number [phone]  The Surgery Itself Neck mass removal involves general anesthesia, typically for 1-2 hours. Patients may be quite sedated for several hours after surgery and may remain sleepy for much of the day. Nausea and vomiting are occasionally seen, and usually resolve by the evening of surgery - even without additional medications. Most patients go home the same day as surgery.  Your Incision Your incision is closed with absorbable sutures and will have a tape bandage or skin glue over the incision. You can shower and wash your hair as usual the day after surgery. You may wash in a bathtub prior to that time if you are careful not to get your neck wet. Do not soak or scrub the incision. You might notice bruising around your incision and slight swelling above the scar when you are upright. You may have numbness of the skin around the incision. In addition, the scar may become pink and hard. This hardening will peak at about 3 weeks and may result in some tightness, which will disappear over the next 2 to 3 months. You should apply sunscreen on your incision site starting 1 month after surgery EVERY day for the first year after surgery. This will prevent a red or pink scar and give you the best cosmetic result for your scar. A daily moisturizer with sunscreen (example Oil of Olay with SPF 15) is fine.  Limitations You can start resuming normal activities as tolerated 7 days after surgery. For some patients, lifting can cause pain and stretching at the surgery site for up to 3 weeks after surgery. You should not drive or drink alcohol while taking pain medications. Most people can return to work/school 1 week after surgery, but there may be physical limitations as far as what you may do while at work. Your surgeon will review your specific limitations and release you when you are ready to return to work.  Medications ?  Pain medication can be used for pain as prescribed. Pain is expected after surgery. Your neck will be sore and pain will be worse when the neck is stretched. As the surgical site heals, pain will resolve over the course of a week. Pain medications can cause nausea, which can be prevented if you take them with food or milk. ? Bacitracin ointment can be applied to the incision once the tape is removed or the glue peels off. This prevents scabbing and itching. ? Take all of your routine medications as prescribed, unless told otherwise by your surgeon. Any medications that thin the blood should be discussed with your surgeon. ? IT IS OK TO TAKE OVER THE COUNTER PAIN MEDICATION (IBUPROFEN, NAPROXEN, or ACETAMINOPHEN) IN ADDITION TO YOUR PRESCRIBED MEDICATIONS. DO NOT TAKE ASPIRIN UNLESS CLEARED WITH YOUR SURGEON. ? Limit Acetaminophen/Tylenol to less than 4,000mg/day ? Limit Ibuprofen/Motrin to less than 3,600mg/day  Pain The main complaint following neck surgery is pain with swallowing and neck movement. Some people experience a dull ache, while others feel a sharp pain. This should not keep you from eating anything you want and will improve daily after surgery.  Reasons to call your surgeon's office ? Persistent fever over 101 F ? Bleeding from the neck incision ? Increasing neck swelling ? Pain that is not relieved by your medications ? Purulent drainage (pus) from the incision ? Redness surrounding the incision that is worsening or getting bigger ? Bleeding is possible after surgery and the most serious cases may cause   trouble breathing. Symptoms include rapid swelling in the neck, trouble breathing, red and purple discoloration of the skin over the incision. Please call doctor immediately or if trouble breathing is present, go to the closest emergency room or call 911.  [facility] [phone], [address] [place], [place] [address] Phone: [phone]

## 2022-08-10 NOTE — Op Note (Signed)
[facility] P[facility] SURGERY OPERATIVE NOTE  Desiree Rasmussen Date/Time of Admission: 08/10/2022  5:20 AM  CSN: [id number];MVE:[id number] Attending Provider: Jenetta Downer, MD Room/Bed: MCPO/NONE DOB: 12/03/1940 Age: 81 y.o.   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    Indications for procedure: Desiree Rasmussen is an 81 year old female whom I saw for initial consultation June 12, 2022 due to complaint of a left-sided neck mass with fluctuation in size.  She underwent CT imaging as well as an ultrasound-guided FNA with radiology negative for malignancy.  Gross findings from FNA were consistent with serous fluid.  Due to concern for inflammatory branchial cleft cyst the patient has requested definitive surgical excision.  Informed, consent was obtained.  R/B/A discussed.  Risk discussed including Pain, bleeding, infection, scarring, numbness, injury to cranial nerves VII, X, XI, XII with associated (facial weakness, dysphagia, hoarseness, shoulder droop, tongue weakness), phrenic nerve injury with associated difficulty breathing and diaphragm paralysis, injury to great vessels including the internal jugular vein and carotid artery with risk of stroke,  hematoma, seroma, abscess, chyle leak, need for further procedures, risk of general anesthesia including heart attack, stroke, death, disability.   Description of Operation:  Patient was identified in the preoperative area and consent confirmed in the chart patient brought to the operating room by the cystoscopy preoperative huddle was performed confirming patient identity and procedure to be performed.  Once all were in agreement we proceeded with surgery.  General anesthesia induced the patient the patient was intubated with an oral endotracheal tube.  Tube was secured and the patient's head was positioned on a doughnut and shoulder roll.  The head was turned to the right exposing left neck.  Mandible was palpated and a transverse neck incision approximately 4 cm was marked out in the left neck 2 fingerbreadths below the level of the mandible to protect the marginal mandibular nerve.  This incision was consistently overlying the palpable tumor.  The incision was anesthetized 1% lidocaine 1 100,000 epinephrine.  Patient prepped draped standard sterile fashion for procedure of this kind.  Final preoperative pause was performed and we will proceed with surgery.  15 blade was used to incise the skin and subcutaneous layer.  Double-pronged skin hooks were applied and the Bovie was used to divide the platysma.  Sharp dissection with scalpel was used to raise a subplatysmal flap superiorly towards the level of the mandible and inferiorly with the Bovie for about 1 to 2 cm.  Next a 3 prong self-retaining retractor was used to facilitate exposure as well as [employer]'s including use of the [employer] robotic arm.  The fascia overlying the tumor was bluntly dissected and released bipolar cautery.  The sternocleidomastoid muscle anterior edge was delineated from the mass and dense fibrosis was released  with blunt dissection and bipolar cautery.  [employer]'s were used to retract the sternocleidomastoid muscle which  demonstrated the mass on top of the internal jugular vein.  Gentle blunt dissection was used along the capsule of the mass circumferentially freeing it from scarring and fascial attachments to the deep neck fascia and the internal jugular vein.  A small branch of the facial artery was divided and ligated with silk ties.  The mass was subsequently liberated and sent for permanent pathology. There was no visible tract towards the pharynx. The wound was copiously irrigated with sterile saline and closure was performed with buried interrupted 3-0 Vicryl platysmal and dermal sutures followed by running 5-0 nylon for the skin.  Dressing consisting of bacitracin, Telfa and brown tape was applied.  Patient with extubated and brought to the recovery in stable condition.  All counts were correct and final.  Patient will be discharged home today with postoperative visit in 1 week for suture removal.  Pain medications have been prescribed and postoperative instructions were provided to the patient's family.   Electronically signed by:  Jenetta Downer, MD  Staff Physician Facial Plastic & Reconstructive Surgery Otolaryngology - Head and Neck Surgery [place], [place]

## 2022-08-10 NOTE — Anesthesia Procedure Notes (Signed)
Procedure Name: Intubation Date/Time: 08/10/2022 7:43 AM  Performed by: Lind Guest, CRNAPre-anesthesia Checklist: Patient identified, Emergency Drugs available, Suction available, Patient being monitored and Timeout performed Patient Re-evaluated:Patient Re-evaluated prior to induction Oxygen Delivery Method: Circle system utilized Preoxygenation: Pre-oxygenation with 100% oxygen Induction Type: IV induction Ventilation: Mask ventilation without difficulty Laryngoscope Size: Mac and 3 Grade View: Grade I Tube type: Oral Number of attempts: 1 Placement Confirmation: ETT inserted through vocal cords under direct vision, positive ETCO2 and breath sounds checked- equal and bilateral Secured at: 23 cm Tube secured with: Tape Dental Injury: Teeth and Oropharynx as per pre-operative assessment

## 2022-08-10 NOTE — Anesthesia Postprocedure Evaluation (Signed)
Anesthesia Post Note  Patient: Desiree Rasmussen  Procedure(s) Performed: EXCISION NECK TUMOR (Left: Neck)     Patient location during evaluation: PACU Anesthesia Type: General Level of consciousness: awake and alert Pain management: pain level controlled Vital Signs Assessment: post-procedure vital signs reviewed and stable Respiratory status: spontaneous breathing, nonlabored ventilation and respiratory function stable Cardiovascular status: stable and blood pressure returned to baseline Anesthetic complications: no   No notable events documented.  Last Vitals:  Vitals:   08/10/22 0945 08/10/22 1000  BP: 120/64 (!) 120/58  Pulse: 61 61  Resp: 14 10  Temp: 36.5 C   SpO2: 96% 94%    Last Pain:  Vitals:   08/10/22 0930  TempSrc:   PainSc: 0-No pain                 Audry Pili

## 2022-08-11 ENCOUNTER — Encounter (HOSPITAL_COMMUNITY): Payer: Self-pay | Admitting: Otolaryngology

## 2022-08-20 ENCOUNTER — Other Ambulatory Visit: Payer: Self-pay

## 2022-08-20 ENCOUNTER — Other Ambulatory Visit (HOSPITAL_COMMUNITY): Payer: Self-pay | Admitting: Otolaryngology

## 2022-08-20 DIAGNOSIS — C770 Secondary and unspecified malignant neoplasm of lymph nodes of head, face and neck: Secondary | ICD-10-CM

## 2022-08-27 ENCOUNTER — Telehealth: Payer: Self-pay | Admitting: Hematology and Oncology

## 2022-08-27 ENCOUNTER — Other Ambulatory Visit: Payer: Self-pay

## 2022-08-27 DIAGNOSIS — C7989 Secondary malignant neoplasm of other specified sites: Secondary | ICD-10-CM

## 2022-08-27 NOTE — Telephone Encounter (Signed)
Scheduled appointment per referral. Patient is aware of appointment date and time. Patient is aware to arrive 15 mins prior to appointment time and to bring updated insurance cards. Patient is aware of location.   

## 2022-08-29 NOTE — Progress Notes (Signed)
Oncology Nurse Navigator Documentation   Placed introductory call to new referral patient Sharlet Notaro. Introduced myself as the H&N oncology nurse navigator that works with Dr. Isidore Moos and Dr. Chryl Heck to whom she has been referred by Dr. Sabino Gasser. She confirmed understanding of referral. Briefly explained my role as her navigator, provided my contact information.  Confirmed understanding of upcoming appts and [place] location, explained arrival and registration process. I encouraged her to call with questions/concerns as she moves forward with appts and procedures.   She verbalized understanding of information provided, expressed appreciation for my call.   Navigator Initial Assessment Employment Status: she is retired Currently on [employer] / STD: na Living Situation: she lives alone, widowed Support System: daughter, granddaughter, family PCP: Seward Carol MD PCD: na Financial Concerns: no Transportation Needs: no Sensory Deficits: no Engineer, building services Needed:  no Ambulation Needs: no Psychosocial Needs:  no Concerns/Needs Understanding Cancer:  addressed/answered by navigator to best of ability Self-Expressed Needs: no   Clinical biochemist, BSN, OCN Head & Neck Oncology Nurse [place] [facility] Phone # [phone]  Fax # [phone]

## 2022-09-05 NOTE — Progress Notes (Signed)
Head and Neck Cancer Location of Tumor / Histology: squamous cell carcinoma of neck of unknown primary site   Patient presente with symptoms of:   She had lump that she noticed back in August 2023, initial core biopsy did not reveal any malignant cells.  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  PET/CT showed no other evidence of primary nor metastatic disease.  There is a low-level hypermetabolic activity at the site of presumed lesion excision.   Biopsies revealed:  08-10-22 Clinical History: mass of left side of neck (cm)  FINAL MICROSCOPIC DIAGNOSIS:  A. SOFT TISSUE MASS, LEFT NECK, EXCISION: - MODERATELY DIFFERENTIATED SQUAMOUS CELL CARCINOMA (SEE COMMENT).  COMMENT: Sections show moderately differentiated squamous cell carcinoma which is present within nodal tissue.  Immunohistochemical stains are performed on block A2.  Tumor cells are positive for CK7 and p40 while negative for TTF-1 and p16.  The CK7 positivity raises the possibility of a lung primary.  Dr. Alric Seton has reviewed the case and agrees with the interpretation.  Clinical correlation recommended.   GROSS DESCRIPTION:  Received fresh is a 2.0 x 1.0 x 1.0 cm nodule of pink-tan soft tissue. The external surface is inked black.  Sectioning reveals an underlying tan-pink cut surface.  A central portion of the specimen is submitted in RPMI for possible flow.  The remaining specimen is entirely submitted in A1-A3. (KW, 08/10/2022)   Nutrition Status Yes No Comments  Weight changes? '[]'$  '[x]'$    Swallowing concerns? '[]'$  '[x]'$    PEG? '[]'$  '[x]'$     Referrals Yes No Comments  Social Work? '[]'$  '[x]'$    Dentistry? '[]'$  '[x]'$  Last seen in June or July this year  Swallowing therapy? '[]'$  '[x]'$    Nutrition? '[]'$  '[x]'$    Med/Onc? '[x]'$  '[]'$  Just say Dr. Chryl Heck   Safety Issues Yes No Comments  Prior radiation? '[]'$   '[x]'$    Pacemaker/ICD? '[]'$  '[x]'$    Possible current pregnancy? '[]'$  '[x]'$    Is the patient on methotrexate? '[]'$  '[x]'$     Tobacco/Marijuana/Snuff/ETOH use: none  Past/Anticipated interventions by otolaryngology, if any:   Dr. 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 SOFT TISSUE MASS, LEFT NECK, EXCISION:  - MODERATELY DIFFERENTIATED SQUAMOUS CELL CARCINOMA (SEE COMMENT).   COMMENT:  Sections show moderately differentiated squamous cell carcinoma which is  present within nodal tissue.  Immunohistochemical stains are performed  on block A2.  Tumor cells are positive for CK7 and p40 while negative  for TTF-1 and p16.  The CK7 positivity raises the possibility of a lung  primary.  Dr. Alric Seton has reviewed the case and agrees with the  interpretation.  Clinical correlation recommended.   A/P: POD#10 s/p left neck excision, pathology demonstrating p16 negative squamous cell carcinoma. CK7 + suggests possible lung primary. Repeat laryngoscopy today negative for upper aerodigestive primary tumor.  Discussed results and next steps. PET CT has been ordered -will have my team ensure expedited scheduling Add on to tumor board in December Sutures removed  today  Electronically signed by:  Jenetta Downer, MD  Staff Physician Facial Plastic & Reconstructive Surgery Otolaryngology - Head and Neck Surgery [place], [place]    Electronically signed by: Tollie Eth, MD 08/20/22 1013      Past/Anticipated interventions by medical oncology, if any:   HISTORY OF PRESENTING ILLNESS:  Dr. Chryl Heck on 09-10-22 Desiree Rasmussen 81 y.o. female is here because of lymphadenopathy   History   [name] first noticed a neck mass in August 2023. She cannot tell if this has grown necessarily but after drainage, she feels this has gotten  smaller. She had a CT neck on 05/17/2022, Palpable complaint reflects a 2.1 cm cystic mass in the left mid neck, often a necrotic node. Recommend ENT referral for tumor workup. 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  Moderately differentiated squamous cell carcinoma She had PET/CT on 09/06/2022 which showed low-level hypermetabolic #3 at the site of presumed lesion excision.  No evidence of hypermetabolic mucosal primary, cervical nodal or distant metastatic disease.  Mildly hypermetabolic right-sided thyroid nodule which has been present back to 2013 and was biopsied before.  No other evidence of metastatic disease.   She is here with her family for initial discussion.  She denies any complaints today except for some groin pain, history of arthritis she was in a wheelchair today, she has noticed some increasing groin pain on the right side and some pain in the buttock cheeks the past 2 weeks.  Besides this she denies any other change in her health.  She did have history of skin cancers removed by the dermatologist based on her past medical history.  Current Complaints / other details:  wants to know about plan and test results  Vitals:   09/11/22 1034  BP: 134/64  Pulse: 67  Resp: 20  Temp: 97.7 F (36.5 C)  SpO2: 99%

## 2022-09-06 ENCOUNTER — Encounter (HOSPITAL_COMMUNITY)
Admission: RE | Admit: 2022-09-06 | Discharge: 2022-09-06 | Disposition: A | Discharge status: Home / Self Care | Payer: Medicare Other | Source: Ambulatory Visit | Attending: Otolaryngology | Admitting: Otolaryngology

## 2022-09-06 DIAGNOSIS — C760 Malignant neoplasm of head, face and neck: Secondary | ICD-10-CM | POA: Diagnosis not present

## 2022-09-06 DIAGNOSIS — C770 Secondary and unspecified malignant neoplasm of lymph nodes of head, face and neck: Secondary | ICD-10-CM | POA: Insufficient documentation

## 2022-09-06 LAB — GLUCOSE, CAPILLARY: Glucose-Capillary: 141 mg/dL — ABNORMAL HIGH (ref 70–99)

## 2022-09-06 MED ORDER — FLUDEOXYGLUCOSE F - 18 (FDG) INJECTION
9.2000 | Freq: Once | INTRAVENOUS | Status: AC
  Administered 2022-09-06: 9.2 via INTRAVENOUS

## 2022-09-07 ENCOUNTER — Encounter: Payer: Self-pay | Admitting: Radiation Oncology

## 2022-09-07 NOTE — Progress Notes (Signed)
After ENT tumor board on 09-05-22, Dr. Sabino Gasser called the patient's Derm Office. In the last 10 years she has not had a cutaneous SCCa of her head and neck region (only lower extremity). Isolated BCC left shoulder/upper back removed June 2021. It seems unlikely her disease has cutaneous origin.  PET CT should be done shortly.  -----------------------------------  Desiree Gibson, MD

## 2022-09-10 ENCOUNTER — Inpatient Hospital Stay: Payer: Medicare Other

## 2022-09-10 ENCOUNTER — Inpatient Hospital Stay: Payer: Medicare Other | Attending: Hematology and Oncology | Admitting: Hematology and Oncology

## 2022-09-10 ENCOUNTER — Other Ambulatory Visit: Payer: Self-pay

## 2022-09-10 ENCOUNTER — Encounter: Payer: Self-pay | Admitting: Hematology and Oncology

## 2022-09-10 VITALS — BP 142/77 | HR 72 | Temp 97.7°F | Resp 16 | Ht 63.0 in | Wt 187.6 lb

## 2022-09-10 DIAGNOSIS — Z7984 Long term (current) use of oral hypoglycemic drugs: Secondary | ICD-10-CM | POA: Diagnosis not present

## 2022-09-10 DIAGNOSIS — E785 Hyperlipidemia, unspecified: Secondary | ICD-10-CM | POA: Insufficient documentation

## 2022-09-10 DIAGNOSIS — E039 Hypothyroidism, unspecified: Secondary | ICD-10-CM | POA: Diagnosis not present

## 2022-09-10 DIAGNOSIS — C770 Secondary and unspecified malignant neoplasm of lymph nodes of head, face and neck: Secondary | ICD-10-CM | POA: Diagnosis not present

## 2022-09-10 DIAGNOSIS — E119 Type 2 diabetes mellitus without complications: Secondary | ICD-10-CM | POA: Diagnosis not present

## 2022-09-10 DIAGNOSIS — I252 Old myocardial infarction: Secondary | ICD-10-CM | POA: Insufficient documentation

## 2022-09-10 DIAGNOSIS — Z79899 Other long term (current) drug therapy: Secondary | ICD-10-CM | POA: Diagnosis not present

## 2022-09-10 DIAGNOSIS — E041 Nontoxic single thyroid nodule: Secondary | ICD-10-CM | POA: Diagnosis not present

## 2022-09-10 DIAGNOSIS — Z7982 Long term (current) use of aspirin: Secondary | ICD-10-CM | POA: Insufficient documentation

## 2022-09-10 DIAGNOSIS — C4442 Squamous cell carcinoma of skin of scalp and neck: Secondary | ICD-10-CM | POA: Diagnosis not present

## 2022-09-10 DIAGNOSIS — I10 Essential (primary) hypertension: Secondary | ICD-10-CM | POA: Insufficient documentation

## 2022-09-10 DIAGNOSIS — I2510 Atherosclerotic heart disease of native coronary artery without angina pectoris: Secondary | ICD-10-CM | POA: Insufficient documentation

## 2022-09-10 NOTE — Progress Notes (Signed)
[facility] CONSULT NOTE  Patient Care Team: Seward Carol, MD as PCP - General (Internal Medicine) Lorretta Harp, MD as PCP - Cardiology (Cardiology) Malmfelt, Stephani Police, RN as Oncology Nurse Navigator Eppie Gibson, MD as Consulting Physician (Radiation Oncology) Benay Pike, MD as Consulting Physician (Hematology and Oncology) Jenetta Downer, MD as Consulting Physician (Otolaryngology)  CHIEF COMPLAINTS/PURPOSE OF CONSULTATION:  SCC from lymph node biopsy neck  ASSESSMENT & PLAN:   Squamous cell carcinoma of neck 1 this is a very pleasant 81 yr old female patient with medical history significant for myocardial infarction, type 2 diabetes, hypertension, dyslipidemia referred to medical oncology for new diagnosis of squamous cell carcinoma identified from excisional biopsy of enlarged lymph node in the neck.  She had lump that she noticed back in August 2023, initial core biopsy did not reveal any malignant cells.  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  PET/CT showed no other evidence of primary nor metastatic disease.  There is a low-level hypermetabolic activity at the site of presumed lesion excision. At this time since there is no evidence of primary in the head and neck region and since there was suspicion of this could be metastatic squamous cell carcinoma of the skin, we have discussed about this in the head and neck tumor board and the recommendation was to consider surgery versus radiation alone.  She will meet with the radiation oncologist tomorrow.  I do not see a clear role of chemotherapy at this time unless there is a definitive primary identified on further ENT evaluation.  I have discussed this very clearly with her.  She had a few questions and all her questions were answered to the best my knowledge.  She will return  to medical oncology as needed at this time.  I have spoken to Dr. Isidore Moos as well.  No orders of the defined types were placed in this encounter.    HISTORY OF PRESENTING ILLNESS:  Desiree Rasmussen 81 y.o. female is here because of lymphadenopathy  History  Desiree Rasmussen first noticed a neck mass in August 2023. She cannot tell if this has grown necessarily but after drainage, she feels this has gotten  smaller. She had a CT neck on 05/17/2022, Palpable complaint reflects a 2.1 cm cystic mass in the left mid neck, often a necrotic node. Recommend ENT referral for tumor workup. 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  Moderately differentiated squamous cell carcinoma She had PET/CT on 09/06/2022 which showed low-level hypermetabolic #3 at the site of presumed lesion excision.  No evidence of hypermetabolic mucosal primary, cervical nodal or distant metastatic disease.  Mildly hypermetabolic right-sided thyroid nodule which has been present back to 2013 and was biopsied before.  No other evidence of metastatic disease.  She is here with her family for initial discussion.  She denies any complaints today except for some groin pain, history of arthritis she was in a wheelchair today, she has noticed some increasing groin pain on the right side and some pain in the buttock cheeks the past 2 weeks.  Besides this she denies any other change in her health.  She did have history of skin cancers removed by the dermatologist based on her past medical history. Rest of the pertinent 10 point ROS  reviewed and negative  MEDICAL HISTORY:  Past Medical History:  Diagnosis Date   BPPV (benign paroxysmal positional vertigo), left    Cancer ([place])    Skin Cancer. Areas removed by Dermatologist   Depression    Family history of heart disease    Fatty liver    GERD  (gastroesophageal reflux disease)    History of hiatal hernia    History of nuclear stress test 05/01/2012   dr berry   normal lexiscan nuclear study w/ no ischemia/  normal LV function and wall motion , ef 73%   Hyperlipidemia    Hypertension    Hypothyroidism    Myocardial infarction ([place]) 2020   OA (osteoarthritis)    Type 2 diabetes mellitus ([place])    Wears contact lenses    left eye only   Wears partial dentures    upper    SURGICAL HISTORY: Past Surgical History:  Procedure Laterality Date   CARPAL TUNNEL RELEASE Bilateral 2018   CATARACT EXTRACTION W/ INTRAOCULAR LENS  IMPLANT, BILATERAL  06/2018   CESAREAN SECTION  1969;  1972   COLONOSCOPY  2013   CORONARY/GRAFT ACUTE MI REVASCULARIZATION N/A 12/10/2018   Procedure: CORONARY/GRAFT ACUTE MI REVASCULARIZATION;  Surgeon: Wellington Hampshire, MD;  Location: [facility];  Service: Cardiovascular;  Laterality: N/A;   CYSTECTOMY Left    sebaceous cyst on breast   EXCISION MASS NECK Left 08/10/2022   Procedure: EXCISION NECK TUMOR;  Surgeon: Jenetta Downer, MD;  Location: [facility];  Service: ENT;  Laterality: Left;   LEFT HEART CATH AND CORONARY ANGIOGRAPHY N/A 12/10/2018   Procedure: LEFT HEART CATH AND CORONARY ANGIOGRAPHY;  Surgeon: Wellington Hampshire, MD;  Location: [facility];  Service: Cardiovascular;  Laterality: N/A;   TOTAL KNEE ARTHROPLASTY Right 06/11/2017   Procedure: RIGHT TOTAL KNEE ARTHROPLASTY;  Surgeon: Paralee Cancel, MD;  Location: [facility];  Service: Orthopedics;  Laterality: Right;  70 mins   TOTAL KNEE ARTHROPLASTY Left 08/19/2018   Procedure: TOTAL KNEE ARTHROPLASTY;  Surgeon: Paralee Cancel, MD;  Location: [facility];  Service: Orthopedics;  Laterality: Left;    SOCIAL HISTORY: Social History   Socioeconomic History   Marital status: Widowed    Spouse name: Not on file   Number of children: 2   Years of education: Not on file   Highest education level: Not on file  Occupational History   Not on  file  Tobacco Use   Smoking status: Never   Smokeless tobacco: Never  Vaping Use   Vaping Use: Never used  Substance and Sexual Activity   Alcohol use: No   Drug use: Never   Sexual activity: Not Currently    Birth control/protection: Post-menopausal  Other Topics Concern   Not on file  Social History Narrative   Not on file   Social Determinants of Health   Financial Resource Strain: Not on file  Food Insecurity: Not on file  Transportation Needs: Not on file  Physical Activity: Not on file  Stress: Not on file  Social Connections: Not on file  Intimate Partner Violence: Not on file    FAMILY HISTORY: Family History  Problem Relation Age of Onset   Colon cancer Mother 48   Heart disease Father 67       heart attack   Breast cancer Maternal Aunt 75   Colon cancer Maternal Uncle    Heart disease Brother        CABG at 71    ALLERGIES:  is allergic to lexapro [escitalopram oxalate], crestor [rosuvastatin], doxycycline, erythromycin, hydrocodone, macrobid [nitrofurantoin], sulfa antibiotics, and zoloft [sertraline hcl].  MEDICATIONS:  Current Outpatient Medications  Medication Sig Dispense Refill   amLODipine (NORVASC) 5 MG tablet Take 5 mg by mouth daily.     Ascorbic Acid (VITAMIN C PO) Take 500 mg by mouth every other day. Alternates days with multivitamin     aspirin 81 MG chewable tablet Chew 81 mg by mouth daily.     atorvastatin (LIPITOR) 80 MG tablet Take 40 mg by mouth daily.     carvedilol (COREG) 25 MG tablet Take 25 mg by mouth 2 (two) times daily with a meal.     clorazepate (TRANXENE) 7.5 MG tablet Take 7.5 mg by mouth 2 (two) times daily as needed for anxiety.     Cyanocobalamin (VITAMIN B12) 1000 MCG TBCR Take 1,000 mcg by mouth daily.     fluticasone (FLONASE) 50 MCG/ACT nasal spray Place 2 sprays into both nostrils daily as needed for allergies or rhinitis.     furosemide (LASIX) 20 MG tablet Take 20 mg by mouth daily as needed for edema.      glimepiride (AMARYL) 2 MG tablet Take 2 mg by mouth daily with breakfast.     HYDROcodone-acetaminophen (NORCO/VICODIN) 5-325 MG tablet Take 1 tablet by mouth every 6 (six) hours as needed for moderate pain. 5 tablet 0   levothyroxine (SYNTHROID) 100 MCG tablet Take 100 mcg by mouth daily before breakfast.     lisinopril (ZESTRIL) 40 MG tablet Take 40 mg by mouth daily.     liver oil-zinc oxide (DESITIN) 40 % ointment Apply 1 application topically as needed for irritation (UNDER THE BREASTS AND BETWEEN SKIN FOLDS).      loratadine (CLARITIN) 10 MG tablet Take 10 mg by mouth daily as needed for allergies or rhinitis.     Multiple Vitamins-Minerals (MULTIVITAMIN WITH MINERALS) tablet Take 1 tablet by mouth every other day. Alternates with vitamin c     nitroGLYCERIN (NITROSTAT) 0.4 MG SL tablet PLACE 1 TABLET UNDER TONGUE EVERY 5 MINUTES AS NEEDED FOR CHEST PAIN 25 tablet 2   omeprazole (PRILOSEC OTC) 20 MG tablet Take 20 mg by mouth daily as needed (acid reflux).     Polyvinyl Alcohol-Povidone (REFRESH OP) Place 1 drop into both eyes daily as needed (dry eyes).     No current facility-administered medications for this visit.     PHYSICAL EXAMINATION: ECOG PERFORMANCE STATUS: 0 - Asymptomatic  Vitals:   09/10/22 1000  BP: (!) 142/77  Pulse: 72  Resp: 16  Temp: 97.7 F (36.5 C)  SpO2: 96%   Filed Weights   09/10/22 1000  Weight: 187 lb 9.6 oz (85.1 kg)    GENERAL:alert, no distress and comfortable SKIN: skin color, texture, turgor are normal, no rashes or significant lesions EYES: normal, conjunctiva are pink and non-injected, sclera clear OROPHARYNX:no exudate, no erythema and lips, buccal mucosa, and tongue normal  NECK: Palpable postsurgical changes in the left neck LYMPH:  no other palpable lymphadenopathy in the cervical, axillary or inguinal LUNGS: clear to auscultation and percussion with normal breathing effort HEART: regular rate & rhythm and no murmurs.  Bilateral lower  extremity edema and Compression stockings ABDOMEN:abdomen soft, non-tender and normal bowel sounds Musculoskeletal:no cyanosis of digits and no clubbing  PSYCH: alert & oriented x 3 with fluent speech NEURO: no focal motor/sensory deficits  LABORATORY DATA:  I have reviewed the data as listed Lab Results  Component Value Date  WBC 6.8 08/01/2022   HGB 13.9 08/01/2022   HCT 43.1 08/01/2022   MCV 91.3 08/01/2022   PLT 143 (L) 08/01/2022     Chemistry      Component Value Date/Time   NA 141 08/01/2022 1125   K 4.2 08/01/2022 1125   CL 104 08/01/2022 1125   CO2 28 08/01/2022 1125   BUN 15 08/01/2022 1125   CREATININE 0.92 08/01/2022 1125      Component Value Date/Time   CALCIUM 9.5 08/01/2022 1125   ALKPHOS 57 08/01/2022 1125   AST 17 08/01/2022 1125   ALT 18 08/01/2022 1125   BILITOT 0.6 08/01/2022 1125        RADIOGRAPHIC STUDIES: I have personally reviewed the radiological images as listed and agreed with the findings in the report. NM PET Image Initial (PI) Skull Base To Thigh (F-18 FDG)  Result Date: 09/07/2022 CLINICAL DATA:  Initial treatment strategy for squamous cell carcinoma of head and neck with lymph node metastasis. Lymph node biopsy 06/25/2022. Left neck tumor excision 08/10/2022. EXAM: NUCLEAR MEDICINE PET SKULL BASE TO THIGH TECHNIQUE: 9.3 mCi F-18 FDG was injected intravenously. Full-ring PET imaging was performed from the skull base to thigh after the radiotracer. CT data was obtained and used for attenuation correction and anatomic localization. Fasting blood glucose: 141 mg/dl COMPARISON:  Neck CT 05/17/2022.  CTA chest 04/27/2012. FINDINGS: Mediastinal blood pool activity: SUV max 3.9 Liver activity: SUV max NA NECK: Low-level hypermetabolism corresponding to left low neck presumably excision related edema including on 35/4. Measures a S.U.V. max of 3.0. No well-defined residual or recurrent lesion in this area. No other hypermetabolic nodes or masses.  Dominant right-sided thyroid nodule of 3.8 cm and a S.U.V. max of 3.5 on 49/4 has been present including back to 04/27/2012 and was biopsied on 06/10/2012. Incidental CT findings: Bilateral carotid atherosclerosis. CHEST: No pulmonary parenchymal hypermetabolism. Mediastinal nodes with low-level hypermetabolism, including a right paratracheal node of 8 mm and a S.U.V. max of 2.7 on 62/4 are favored to be reactive. Incidental CT findings: Aortic and coronary artery calcification. Mild cardiomegaly. ABDOMEN/PELVIS: No abdominopelvic parenchymal or nodal hypermetabolism. Presumed urinary contamination about the perineum. Incidental CT findings: Normal adrenal glands. Abdominal aortic atherosclerosis. Extensive colonic diverticulosis. Pelvic floor laxity. SKELETON: No abnormal marrow activity. Incidental CT findings: None. IMPRESSION: 1. Low-level hypermetabolism within the left side of the neck, at the site of presumed lesion excision. No evidence of hypermetabolic mucosal primary, cervical nodal, or distant metastatic disease. 2. Mildly hypermetabolic right-sided thyroid nodule has been present back to 2013 and was biopsied on the 06/10/2012. 3. Incidental findings, including: Pelvic floor laxity. Coronary artery atherosclerosis. Aortic Atherosclerosis (ICD10-I70.0). Electronically Signed   By: Abigail Miyamoto M.D.   On: 09/07/2022 14:20    All questions were answered. The patient knows to call the clinic with any problems, questions or concerns. I spent 60 minutes in the care of this patient including H and P, review of records, counseling and coordination of care.     Benay Pike, MD 09/10/2022 12:37 PM

## 2022-09-10 NOTE — Progress Notes (Signed)
Radiation Oncology         ([phone]) [phone] ________________________________  Initial Outpatient Consultation  Name: Desiree Rasmussen MRN: [id number]  Date: 09/11/2022  DOB: [birth year]  VZ:CHYIFO, Desiree Moll, MD  Jenetta Downer, MD   REFERRING PHYSICIAN: Jenetta Downer, MD  DIAGNOSIS: C77.0   ICD-10-CM   1. 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 Squamous cell carcinoma of neck  C44.42     3. Secondary malignant neoplasm of cervical lymph node (HCC)  C77.0      Left neck mass - moderately differentiated squamous cell carcinoma; unknown primary  CHIEF COMPLAINT: Here to discuss management of neck cancer  HISTORY OF PRESENT ILLNESS:Desiree Rasmussen is a 81 y.o. female who presented to her PCP, Dr. Delfina Redwood, this past August with the cc of an enlarging left neck mass which she first noticed in the middle of August. This prompted a CT of the neck performed on 05/17/22 which revealed a cystic mass in the left mid neck , measuring approximately 2.1 cm, thought to represent a necrotic node. CT also demonstrated a known and biopsy proven benign right thyroid nodule (detailed below).    Subsequently, the patient was referred to Dr. Sabino Gasser, [facility], on 06/12/22 for further evaluation and management. During this visit, the patient endorsed associated left ear pain when opening and closing her jaw. Laryngoscopy performed during this visit was unremarkable. However, a firm, non-tender left level 2/3 neck mass was palpated on examination, measuring approximately 2 cm.   FNA of the left neck mass/cyst on 06/25/22 showed no evidence of malignancy.   The patient opted to proceed with excision of the left neck mass on 08/10/22 under the care of Dr. Sabino Gasser. Pathology from procedure revealed moderately differentiated squamous cell carcinoma (p16 negative). IHC stains performed showed the tumor cells as positive for CK7, raising the possibility of a  lung cancer primary.   Dr. Sabino Gasser performed a repeat laryngoscopy on 08/20/22 which was again unremarkable.   Pertinent imaging thus far includes a PET scan performed on 09/06/22 which revealed low-level hypermetabolism within the left side of the neck at the site of excised lesion (SUV. max of 3.0). The known right thyroid nodule was also seen with mild hypermetabolism (SUV. max of 3.5).  PET otherwise showed no evidence of hypermetabolic mucosal primary, cervical nodal, or distant metastatic disease. Some mediastinal nodes were appreciated with low-level hypermetabolism, however these are favored to be reactive in etiology.   The patient met with Dr. Chryl Heck yesterday to review treatment options. Based on discussion at the head and neck tumor board on 09/05/22, team noted that there was suspicion for metastatic SCC of the skin given that there is no evidence of a head or neck primary. However, Dr. Sabino Gasser called the patient's dermatology office after the ENT tumor board, and the patient has not had a cutaneous SCC to her head or neck region (only lower extremity and an isolated BCC of the left shoulder/upper back which was removed in June of 2021). Given so, it seems unlikely that her disease is cutaneous in origin.   Treatment considerations reviewed at the tumor board included surgery versus radiation alone which we will discuss in detail today. Unless there is a definitive primary identified, Dr. Chryl Heck states that there is no clear role for chemotherapy at this time.    Swallowing issues, if any: none  Weight Changes: none   Pain status:  left ear pain as noted below  Other symptoms: presented with associated left ear pain when opening and closing her jaw  Tobacco history, if any: never smoker, although she has a significant history of secondhand smoke exposure for about 17 years while married to her husband   ETOH abuse, if any: does not drink  Prior cancers, if any: History of skin cancer -  has not had a cutaneous SCC to her head or neck region in the last 10 years (only distal, upper extremity, lower extremity and an isolated BCC of the left shoulder/upper back which was removed in June of 2021).   She does have a history of an enlarged right thyroid gland and right thyroid nodule. She reported having a benign thyroid biopsy performed years ago during her initial consult with Dr. Sabino Gasser. However, Dr. Sabino Gasser notes that there is only a record of a thyroid biopsy performed on 06/10/22 (located in care everywhere). Given her prior benign biopsy, Dr. Sabino Gasser will not order repeat imaging or further work-up unless the patient develops new signs or symptoms).    She denies any significant, direct smoking history. Her husband does smoke. She has not had any other cancer in her life.   PREVIOUS RADIATION THERAPY: No  PAST MEDICAL HISTORY:  has a past medical history of BPPV (benign paroxysmal positional vertigo), left, Cancer ([place]), Depression, Family history of heart disease, Fatty liver, GERD (gastroesophageal reflux disease), History of hiatal hernia, History of nuclear stress test (05/01/2012   dr berry), Hyperlipidemia, Hypertension, Hypothyroidism, Myocardial infarction ([place]) (2020), OA (osteoarthritis), Type 2 diabetes mellitus ([place]), Wears contact lenses, and Wears partial dentures.    PAST SURGICAL HISTORY: Past Surgical History:  Procedure Laterality Date   CARPAL TUNNEL RELEASE Bilateral 2018   CATARACT EXTRACTION W/ INTRAOCULAR LENS  IMPLANT, BILATERAL  06/2018   CESAREAN SECTION  1969;  1972   COLONOSCOPY  2013   CORONARY/GRAFT ACUTE MI REVASCULARIZATION N/A 12/10/2018   Procedure: CORONARY/GRAFT ACUTE MI REVASCULARIZATION;  Surgeon: Wellington Hampshire, MD;  Location: [facility];  Service: Cardiovascular;  Laterality: N/A;   CYSTECTOMY Left    sebaceous cyst on breast   EXCISION MASS NECK Left 08/10/2022   Procedure: EXCISION NECK TUMOR;  Surgeon: Jenetta Downer, MD;   Location: [place];  Service: ENT;  Laterality: Left;   LEFT HEART CATH AND CORONARY ANGIOGRAPHY N/A 12/10/2018   Procedure: LEFT HEART CATH AND CORONARY ANGIOGRAPHY;  Surgeon: Wellington Hampshire, MD;  Location: [facility];  Service: Cardiovascular;  Laterality: N/A;   TOTAL KNEE ARTHROPLASTY Right 06/11/2017   Procedure: RIGHT TOTAL KNEE ARTHROPLASTY;  Surgeon: Paralee Cancel, MD;  Location: [facility];  Service: Orthopedics;  Laterality: Right;  70 mins   TOTAL KNEE ARTHROPLASTY Left 08/19/2018   Procedure: TOTAL KNEE ARTHROPLASTY;  Surgeon: Paralee Cancel, MD;  Location: [facility];  Service: Orthopedics;  Laterality: Left;    FAMILY HISTORY: family history includes Breast cancer (age of onset: 60) in her maternal aunt; Colon cancer in her maternal uncle; Colon cancer (age of onset: 14) in her mother; Heart disease in her brother; Heart disease (age of onset: 31) in her father.  SOCIAL HISTORY:  reports that she has never smoked. She has never used smokeless tobacco. She reports that she does not drink alcohol and does not use drugs.  ALLERGIES: Lexapro [escitalopram oxalate], Crestor [rosuvastatin], Doxycycline, Erythromycin, Hydrocodone, Macrobid [nitrofurantoin], Sulfa antibiotics, and Zoloft [sertraline hcl]  MEDICATIONS:  Current Outpatient Medications  Medication Sig Dispense Refill  amLODipine (NORVASC) 5 MG tablet Take 5 mg by mouth daily.     Ascorbic Acid (VITAMIN C PO) Take 500 mg by mouth every other day. Alternates days with multivitamin     aspirin 81 MG chewable tablet Chew 81 mg by mouth daily.     atorvastatin (LIPITOR) 80 MG tablet Take 40 mg by mouth daily.     carvedilol (COREG) 25 MG tablet Take 25 mg by mouth 2 (two) times daily with a meal.     clorazepate (TRANXENE) 7.5 MG tablet Take 7.5 mg by mouth 2 (two) times daily as needed for anxiety.     Cyanocobalamin (VITAMIN B12) 1000 MCG TBCR Take 1,000 mcg by mouth daily.     fluticasone (FLONASE) 50 MCG/ACT nasal spray  Place 2 sprays into both nostrils daily as needed for allergies or rhinitis.     furosemide (LASIX) 20 MG tablet Take 20 mg by mouth daily as needed for edema.     glimepiride (AMARYL) 2 MG tablet Take 2 mg by mouth daily with breakfast.     HYDROcodone-acetaminophen (NORCO/VICODIN) 5-325 MG tablet Take 1 tablet by mouth every 6 (six) hours as needed for moderate pain. 5 tablet 0   levothyroxine (SYNTHROID) 100 MCG tablet Take 100 mcg by mouth daily before breakfast.     lisinopril (ZESTRIL) 40 MG tablet Take 40 mg by mouth daily.     loratadine (CLARITIN) 10 MG tablet Take 10 mg by mouth daily as needed for allergies or rhinitis.     Multiple Vitamins-Minerals (MULTIVITAMIN WITH MINERALS) tablet Take 1 tablet by mouth every other day. Alternates with vitamin c     nitroGLYCERIN (NITROSTAT) 0.4 MG SL tablet PLACE 1 TABLET UNDER TONGUE EVERY 5 MINUTES AS NEEDED FOR CHEST PAIN 25 tablet 2   omeprazole (PRILOSEC OTC) 20 MG tablet Take 20 mg by mouth daily as needed (acid reflux).     Polyvinyl Alcohol-Povidone (REFRESH OP) Place 1 drop into both eyes daily as needed (dry eyes).     liver oil-zinc oxide (DESITIN) 40 % ointment Apply 1 application topically as needed for irritation (UNDER THE BREASTS AND BETWEEN SKIN FOLDS).  (Patient not taking: Reported on 09/11/2022)     No current facility-administered medications for this encounter.    REVIEW OF SYSTEMS:  Notable for that above.   PHYSICAL EXAM:  height is 5' 3.5" (1.613 m) and weight is 187 lb 12.8 oz (85.2 kg). Her temperature is 97.7 F (36.5 C). Her blood pressure is 134/64 and her pulse is 67. Her respiration is 20 and oxygen saturation is 99%.   General: Alert and oriented, in no acute distress HEENT: Head is normocephalic. Extraocular movements are intact. No masses or concerning lesions noted in the oropharynx. Neck: Neck is notable for well healed incision from left cervical mass removal. Skin is warm, dry, and intact.   Heart:  Regular in rate and rhythm with no murmurs, rubs, or gallops. Chest: Clear to auscultation bilaterally, with no rhonchi, wheezes, or rales. Abdomen: Soft, nontender, nondistended, with no rigidity or guarding. Lymphatics: see Neck Exam Skin: No concerning H+N lesions.  No obvious left scalp lesions under hair. Musculoskeletal: symmetric strength and muscle tone throughout. Neurologic: Cranial nerves II through XII are grossly intact. No obvious focalities. Speech is fluent. Coordination is intact. Psychiatric: Judgment and insight are intact. Affect is appropriate.   ECOG = 1  0 - Asymptomatic (Fully active, able to carry on all predisease activities without restriction)  1 - Symptomatic  but completely ambulatory (Restricted in physically strenuous activity but ambulatory and able to carry out work of a light or sedentary nature. For example, light housework, office work)  2 - Symptomatic, <50% in bed during the day (Ambulatory and capable of all self care but unable to carry out any work activities. Up and about more than 50% of waking hours)  3 - Symptomatic, >50% in bed, but not bedbound (Capable of only limited self-care, confined to bed or chair 50% or more of waking hours)  4 - Bedbound (Completely disabled. Cannot carry on any self-care. Totally confined to bed or chair)  5 - Death   Eustace Pen MM, Creech RH, Tormey DC, et al. [phone]). "Toxicity and response criteria of the [facility]". [place] Oncol. 5 (6): 649-55   LABORATORY DATA:  Lab Results  Component Value Date   WBC 6.8 08/01/2022   HGB 13.9 08/01/2022   HCT 43.1 08/01/2022   MCV 91.3 08/01/2022   PLT 143 (L) 08/01/2022   CMP     Component Value Date/Time   NA 141 08/01/2022 1125   K 4.2 08/01/2022 1125   CL 104 08/01/2022 1125   CO2 28 08/01/2022 1125   GLUCOSE 106 (H) 08/01/2022 1125   BUN 15 08/01/2022 1125   CREATININE 0.92 08/01/2022 1125   CALCIUM 9.5 08/01/2022 1125   PROT 6.1  (L) 08/01/2022 1125   ALBUMIN 3.5 08/01/2022 1125   AST 17 08/01/2022 1125   ALT 18 08/01/2022 1125   ALKPHOS 57 08/01/2022 1125   BILITOT 0.6 08/01/2022 1125   GFRNONAA >60 08/01/2022 1125   GFRAA >60 12/12/2018 0358      No results found for: "TSH"   RADIOGRAPHY: NM PET Image Initial (PI) Skull Base To Thigh (F-18 FDG)  Result Date: 09/07/2022 CLINICAL DATA:  Initial treatment strategy for squamous cell carcinoma of head and neck with lymph node metastasis. Lymph node biopsy 06/25/2022. Left neck tumor excision 08/10/2022. EXAM: NUCLEAR MEDICINE PET SKULL BASE TO THIGH TECHNIQUE: 9.3 mCi F-18 FDG was injected intravenously. Full-ring PET imaging was performed from the skull base to thigh after the radiotracer. CT data was obtained and used for attenuation correction and anatomic localization. Fasting blood glucose: 141 mg/dl COMPARISON:  Neck CT 05/17/2022.  CTA chest 04/27/2012. FINDINGS: Mediastinal blood pool activity: SUV max 3.9 Liver activity: SUV max NA NECK: Low-level hypermetabolism corresponding to left low neck presumably excision related edema including on 35/4. Measures a S.U.V. max of 3.0. No well-defined residual or recurrent lesion in this area. No other hypermetabolic nodes or masses. Dominant right-sided thyroid nodule of 3.8 cm and a S.U.V. max of 3.5 on 49/4 has been present including back to 04/27/2012 and was biopsied on 06/10/2012. Incidental CT findings: Bilateral carotid atherosclerosis. CHEST: No pulmonary parenchymal hypermetabolism. Mediastinal nodes with low-level hypermetabolism, including a right paratracheal node of 8 mm and a S.U.V. max of 2.7 on 62/4 are favored to be reactive. Incidental CT findings: Aortic and coronary artery calcification. Mild cardiomegaly. ABDOMEN/PELVIS: No abdominopelvic parenchymal or nodal hypermetabolism. Presumed urinary contamination about the perineum. Incidental CT findings: Normal adrenal glands. Abdominal aortic atherosclerosis.  Extensive colonic diverticulosis. Pelvic floor laxity. SKELETON: No abnormal marrow activity. Incidental CT findings: None. IMPRESSION: 1. Low-level hypermetabolism within the left side of the neck, at the site of presumed lesion excision. No evidence of hypermetabolic mucosal primary, cervical nodal, or distant metastatic disease. 2. Mildly hypermetabolic right-sided thyroid nodule has been present back to 2013 and was biopsied  on the 06/10/2012. 3. Incidental findings, including: Pelvic floor laxity. Coronary artery atherosclerosis. Aortic Atherosclerosis (ICD10-I70.0). Electronically Signed   By: Abigail Miyamoto M.D.   On: 09/07/2022 14:20      IMPRESSION/PLAN: Left neck mass - moderately differentiated squamous cell carcinoma; unknown primary, p16 negative  This is a delightful patient with neck cancer. Although we do not know the primary source, recent PET was promising for no further metastatic disease and clearance of the left neck mass - no primary in throat.   I recommend that pathology provide an addendum regarding EBV status as well as whether there is extracapsular extension -if EBV status is positive this raises a question of nasopharyngeal primary which would be relevant to her treatment plan and increase the likelihood that she needs radiation for cure.  Extracapsular extension also would raise the concern for a contaminated tumor bed and need for radiation therapy to at least the ipsilateral neck.  No further decisions can be made regarding her care until we get clarity from pathology.  I discussed her case in detail with Dr. Sabino Gasser about hypothetical plans for her treatment if the extracapsular extension and EBV are negative.  He tentatively plans on biopsying the mucosal axis if anesthesia is realistic given her comorbidities.  If she can tolerate anesthesia long enough for an ipsilateral neck dissection at the same time, that may spare her from the need for radiation therapy.  If for some  reason she cannot or decides she does not want to undergo neck dissection I would  treat the ipsilateral neck w/ RT; treatment of the mucosal axis depends on results regarding the extracapsular extension, EBV, and possible mucosal biopsies.   Again, all of this is a preliminary assessment because we do not have the EBV or ECE status yet.   I discussed all of the above with the patient and her daughter today.  Indeed this is a complex case with more than 1 treatment option.  I also made her aware that there is still a possibility for radiation therapy following the biopsy and neck dissection depending on the results  (I do not think it is highly likely that she will have multiple lymph nodes being positive but that could be an indication for the need for postoperative radiation)  Patient understands the possible benefit of the neck dissection, but has concerns about being under anesthesia for 2 hours for the procedure. We discussed that if she, Dr. Sabino Gasser, or her cardiologist do not feel comfortable having her undergo the procedure due to her health, radiotherapy instead of surgery is always an option.   We did discussed the potential risks, benefits, and side effects of 4-6 weeks of radiotherapy today. We talked in detail about acute and late effects. We discussed that some of the most bothersome acute effects may be mucositis, dysgeusia, salivary changes, skin irritation, hair loss, dehydration, weight loss and fatigue. We talked about late effects which include but are not necessarily limited to dysphagia, hypothyroidism, nerve injury, vascular injury, spinal cord injury, xerostomia, trismus, neck edema, and potential injury to any of the tissues in the head and neck region. Patient does experience some claustrophipa so we will request open face mask if we do decide to deliver radiation. No guarantees of treatment were given. A consent form was signed and placed in the patient's medical record. I shared  this discussion with Dr. Sabino Gasser and we will formulate a plan of next best steps after path addendum. I look forward to  participating in the patient's care as needed.  On date of service, in total, I spent 60 minutes on this encounter. Patient was seen in person. __________________________________________   Leona Singleton, PA   Eppie Gibson, MD  This document serves as a record of services personally performed by Eppie Gibson, MD. It was created on her behalf by Roney Mans, a trained medical scribe. The creation of this record is based on the scribe's personal observations and the provider's statements to them. This document has been checked and approved by the attending provider.

## 2022-09-10 NOTE — Assessment & Plan Note (Addendum)
1 this is a very pleasant 81 yr old female patient with medical history significant for myocardial infarction, type 2 diabetes, hypertension, dyslipidemia referred to medical oncology for new diagnosis of squamous cell carcinoma identified from excisional biopsy of enlarged lymph node in the neck.  She had lump that she noticed back in August 2023, initial core biopsy did not reveal any malignant cells.  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  PET/CT showed no other evidence of primary nor metastatic disease.  There is a low-level hypermetabolic activity at the site of presumed lesion excision. At this time since there is no evidence of primary in the head and neck region and since there was suspicion of this could be metastatic squamous cell carcinoma of the skin, we have discussed about this in the head and neck tumor board and the recommendation was to consider surgery versus radiation alone.  She will meet with the radiation oncologist tomorrow.  I do not see a clear role of chemotherapy at this time unless there is a definitive primary identified on further ENT evaluation.  I have discussed this very clearly with her.  She had a few questions and all her questions were answered to the best my knowledge.  She will return to medical oncology as needed at this time.  I have spoken to Dr. Isidore Moos as well.

## 2022-09-11 ENCOUNTER — Encounter: Payer: Self-pay | Admitting: Radiation Oncology

## 2022-09-11 ENCOUNTER — Ambulatory Visit
Admission: RE | Admit: 2022-09-11 | Discharge: 2022-09-11 | Disposition: A | Discharge status: Home / Self Care | Payer: Medicare Other | Source: Ambulatory Visit | Attending: Radiation Oncology | Admitting: Radiation Oncology

## 2022-09-11 VITALS — BP 134/64 | HR 67 | Temp 97.7°F | Resp 20 | Ht 63.5 in | Wt 187.8 lb

## 2022-09-11 DIAGNOSIS — C770 Secondary and unspecified malignant neoplasm of lymph nodes of head, face and neck: Secondary | ICD-10-CM | POA: Diagnosis not present

## 2022-09-11 DIAGNOSIS — I2510 Atherosclerotic heart disease of native coronary artery without angina pectoris: Secondary | ICD-10-CM | POA: Insufficient documentation

## 2022-09-11 DIAGNOSIS — Z79890 Hormone replacement therapy (postmenopausal): Secondary | ICD-10-CM | POA: Insufficient documentation

## 2022-09-11 DIAGNOSIS — H9202 Otalgia, left ear: Secondary | ICD-10-CM | POA: Insufficient documentation

## 2022-09-11 DIAGNOSIS — I252 Old myocardial infarction: Secondary | ICD-10-CM | POA: Insufficient documentation

## 2022-09-11 DIAGNOSIS — C7989 Secondary malignant neoplasm of other specified sites: Secondary | ICD-10-CM

## 2022-09-11 DIAGNOSIS — E041 Nontoxic single thyroid nodule: Secondary | ICD-10-CM | POA: Insufficient documentation

## 2022-09-11 DIAGNOSIS — C4442 Squamous cell carcinoma of skin of scalp and neck: Secondary | ICD-10-CM

## 2022-09-11 DIAGNOSIS — E785 Hyperlipidemia, unspecified: Secondary | ICD-10-CM | POA: Insufficient documentation

## 2022-09-11 DIAGNOSIS — I10 Essential (primary) hypertension: Secondary | ICD-10-CM | POA: Insufficient documentation

## 2022-09-11 DIAGNOSIS — C801 Malignant (primary) neoplasm, unspecified: Secondary | ICD-10-CM | POA: Diagnosis not present

## 2022-09-11 DIAGNOSIS — Z79899 Other long term (current) drug therapy: Secondary | ICD-10-CM | POA: Insufficient documentation

## 2022-09-11 DIAGNOSIS — Z7984 Long term (current) use of oral hypoglycemic drugs: Secondary | ICD-10-CM | POA: Insufficient documentation

## 2022-09-11 DIAGNOSIS — I700 Atherosclerosis of aorta: Secondary | ICD-10-CM | POA: Diagnosis not present

## 2022-09-11 DIAGNOSIS — K219 Gastro-esophageal reflux disease without esophagitis: Secondary | ICD-10-CM | POA: Insufficient documentation

## 2022-09-11 DIAGNOSIS — Z7982 Long term (current) use of aspirin: Secondary | ICD-10-CM | POA: Diagnosis not present

## 2022-09-11 DIAGNOSIS — E039 Hypothyroidism, unspecified: Secondary | ICD-10-CM | POA: Diagnosis not present

## 2022-09-11 NOTE — Progress Notes (Signed)
Oncology Nurse Navigator Documentation   At Dr. Pearlie Oyster request I called [facility] today to request an addendum to her 08/10/22 Biopsy report to include the status of Extracapsular extension and to add EBV testing to the results. I spoke with Varney Biles and she has sent it for EBV testing and will request to addendum from Dr. Chauncey Cruel.  Harlow Asa RN, BSN, OCN Head & Neck Oncology Nurse [place] at [facility] Phone # [phone]  Fax # [phone]

## 2022-09-11 NOTE — Progress Notes (Signed)
Oncology Nurse Navigator Documentation   Met with patient before her initial consult with Dr. Isidore Moos. She was accompanied by her daughter.  Further introduced myself as her/their Navigator, explained my role as a member of the Care Team. They know that Dr. Sabino Gasser will call them today or tomorrow after an addendum is added to her pathology report.  Assisted with post-consult appt scheduling. They verbalized understanding of information provided. I encouraged them to call with questions/concerns moving forward.  Harlow Asa, RN, BSN, OCN Head & Neck Oncology Nurse [place] at [facility] [phone]

## 2022-09-14 LAB — SURGICAL PATHOLOGY

## 2022-09-17 DIAGNOSIS — M7061 Trochanteric bursitis, right hip: Secondary | ICD-10-CM | POA: Diagnosis not present

## 2022-09-17 DIAGNOSIS — M25551 Pain in right hip: Secondary | ICD-10-CM | POA: Diagnosis not present

## 2022-09-21 DIAGNOSIS — M7061 Trochanteric bursitis, right hip: Secondary | ICD-10-CM | POA: Diagnosis not present

## 2022-09-27 DIAGNOSIS — Z79899 Other long term (current) drug therapy: Secondary | ICD-10-CM | POA: Diagnosis not present

## 2022-09-27 DIAGNOSIS — M5136 Other intervertebral disc degeneration, lumbar region: Secondary | ICD-10-CM | POA: Diagnosis not present

## 2022-09-27 DIAGNOSIS — Z5181 Encounter for therapeutic drug level monitoring: Secondary | ICD-10-CM | POA: Diagnosis not present

## 2022-09-27 DIAGNOSIS — M4310 Spondylolisthesis, site unspecified: Secondary | ICD-10-CM | POA: Diagnosis not present

## 2022-10-29 DIAGNOSIS — M25551 Pain in right hip: Secondary | ICD-10-CM | POA: Diagnosis not present

## 2022-11-01 DIAGNOSIS — M25551 Pain in right hip: Secondary | ICD-10-CM | POA: Diagnosis not present

## 2022-11-02 DIAGNOSIS — K219 Gastro-esophageal reflux disease without esophagitis: Secondary | ICD-10-CM | POA: Diagnosis not present

## 2022-11-06 DIAGNOSIS — Z1231 Encounter for screening mammogram for malignant neoplasm of breast: Secondary | ICD-10-CM | POA: Diagnosis not present

## 2022-11-14 DIAGNOSIS — M8589 Other specified disorders of bone density and structure, multiple sites: Secondary | ICD-10-CM | POA: Diagnosis not present

## 2022-11-14 DIAGNOSIS — Z780 Asymptomatic menopausal state: Secondary | ICD-10-CM | POA: Diagnosis not present

## 2022-12-21 DIAGNOSIS — F410 Panic disorder [episodic paroxysmal anxiety] without agoraphobia: Secondary | ICD-10-CM | POA: Diagnosis not present

## 2022-12-21 DIAGNOSIS — E1169 Type 2 diabetes mellitus with other specified complication: Secondary | ICD-10-CM | POA: Diagnosis not present

## 2022-12-21 DIAGNOSIS — E7800 Pure hypercholesterolemia, unspecified: Secondary | ICD-10-CM | POA: Diagnosis not present

## 2022-12-21 DIAGNOSIS — Z0000 Encounter for general adult medical examination without abnormal findings: Secondary | ICD-10-CM | POA: Diagnosis not present

## 2022-12-21 DIAGNOSIS — C760 Malignant neoplasm of head, face and neck: Secondary | ICD-10-CM | POA: Diagnosis not present

## 2022-12-21 DIAGNOSIS — I2510 Atherosclerotic heart disease of native coronary artery without angina pectoris: Secondary | ICD-10-CM | POA: Diagnosis not present

## 2022-12-21 DIAGNOSIS — E663 Overweight: Secondary | ICD-10-CM | POA: Diagnosis not present

## 2022-12-25 DIAGNOSIS — M25511 Pain in right shoulder: Secondary | ICD-10-CM | POA: Diagnosis not present

## 2022-12-25 DIAGNOSIS — M25512 Pain in left shoulder: Secondary | ICD-10-CM | POA: Diagnosis not present

## 2023-01-08 DIAGNOSIS — C7989 Secondary malignant neoplasm of other specified sites: Secondary | ICD-10-CM | POA: Diagnosis not present

## 2023-01-08 DIAGNOSIS — K219 Gastro-esophageal reflux disease without esophagitis: Secondary | ICD-10-CM | POA: Diagnosis not present

## 2023-01-08 DIAGNOSIS — C801 Malignant (primary) neoplasm, unspecified: Secondary | ICD-10-CM | POA: Diagnosis not present

## 2023-01-28 DIAGNOSIS — M545 Low back pain, unspecified: Secondary | ICD-10-CM | POA: Diagnosis not present

## 2023-01-28 DIAGNOSIS — M5136 Other intervertebral disc degeneration, lumbar region: Secondary | ICD-10-CM | POA: Diagnosis not present

## 2023-01-28 DIAGNOSIS — M4310 Spondylolisthesis, site unspecified: Secondary | ICD-10-CM | POA: Diagnosis not present

## 2023-01-28 DIAGNOSIS — N183 Chronic kidney disease, stage 3 unspecified: Secondary | ICD-10-CM | POA: Diagnosis not present

## 2023-02-13 DIAGNOSIS — R5383 Other fatigue: Secondary | ICD-10-CM | POA: Diagnosis not present

## 2023-02-13 DIAGNOSIS — E1122 Type 2 diabetes mellitus with diabetic chronic kidney disease: Secondary | ICD-10-CM | POA: Diagnosis not present

## 2023-02-13 DIAGNOSIS — I959 Hypotension, unspecified: Secondary | ICD-10-CM | POA: Diagnosis not present

## 2023-02-13 DIAGNOSIS — R739 Hyperglycemia, unspecified: Secondary | ICD-10-CM | POA: Diagnosis not present

## 2023-04-03 ENCOUNTER — Other Ambulatory Visit: Payer: Self-pay | Admitting: Cardiovascular Disease

## 2023-04-10 DIAGNOSIS — C7989 Secondary malignant neoplasm of other specified sites: Secondary | ICD-10-CM | POA: Diagnosis not present

## 2023-04-10 DIAGNOSIS — C801 Malignant (primary) neoplasm, unspecified: Secondary | ICD-10-CM | POA: Diagnosis not present

## 2023-04-19 DIAGNOSIS — I10 Essential (primary) hypertension: Secondary | ICD-10-CM | POA: Diagnosis not present

## 2023-04-19 DIAGNOSIS — E1122 Type 2 diabetes mellitus with diabetic chronic kidney disease: Secondary | ICD-10-CM | POA: Diagnosis not present

## 2023-04-19 DIAGNOSIS — R609 Edema, unspecified: Secondary | ICD-10-CM | POA: Diagnosis not present

## 2023-04-19 DIAGNOSIS — R600 Localized edema: Secondary | ICD-10-CM | POA: Diagnosis not present

## 2023-04-19 DIAGNOSIS — E8770 Fluid overload, unspecified: Secondary | ICD-10-CM | POA: Diagnosis not present

## 2023-04-19 DIAGNOSIS — F458 Other somatoform disorders: Secondary | ICD-10-CM | POA: Diagnosis not present

## 2023-04-25 DIAGNOSIS — H52223 Regular astigmatism, bilateral: Secondary | ICD-10-CM | POA: Diagnosis not present

## 2023-04-25 DIAGNOSIS — E119 Type 2 diabetes mellitus without complications: Secondary | ICD-10-CM | POA: Diagnosis not present

## 2023-04-25 DIAGNOSIS — H5213 Myopia, bilateral: Secondary | ICD-10-CM | POA: Diagnosis not present

## 2023-04-25 DIAGNOSIS — H524 Presbyopia: Secondary | ICD-10-CM | POA: Diagnosis not present

## 2023-04-25 DIAGNOSIS — H35033 Hypertensive retinopathy, bilateral: Secondary | ICD-10-CM | POA: Diagnosis not present

## 2023-05-01 DIAGNOSIS — H53143 Visual discomfort, bilateral: Secondary | ICD-10-CM | POA: Diagnosis not present

## 2023-05-23 DIAGNOSIS — M25511 Pain in right shoulder: Secondary | ICD-10-CM | POA: Diagnosis not present

## 2023-05-23 DIAGNOSIS — M25512 Pain in left shoulder: Secondary | ICD-10-CM | POA: Diagnosis not present

## 2023-05-30 DIAGNOSIS — Z96651 Presence of right artificial knee joint: Secondary | ICD-10-CM | POA: Diagnosis not present

## 2023-05-30 DIAGNOSIS — M5136 Other intervertebral disc degeneration, lumbar region: Secondary | ICD-10-CM | POA: Diagnosis not present

## 2023-05-30 DIAGNOSIS — M47816 Spondylosis without myelopathy or radiculopathy, lumbar region: Secondary | ICD-10-CM | POA: Diagnosis not present

## 2023-06-12 ENCOUNTER — Encounter: Payer: Self-pay | Admitting: Cardiovascular Disease

## 2023-06-12 ENCOUNTER — Telehealth: Payer: Self-pay | Admitting: Pharmacy Technician

## 2023-06-12 ENCOUNTER — Ambulatory Visit: Payer: Medicare Other | Attending: Cardiovascular Disease | Admitting: Cardiovascular Disease

## 2023-06-12 ENCOUNTER — Telehealth: Payer: Self-pay | Admitting: Pharmacist

## 2023-06-12 ENCOUNTER — Other Ambulatory Visit (HOSPITAL_COMMUNITY): Payer: Self-pay

## 2023-06-12 VITALS — BP 122/64 | HR 62 | Ht 63.0 in | Wt 191.0 lb

## 2023-06-12 DIAGNOSIS — E782 Mixed hyperlipidemia: Secondary | ICD-10-CM

## 2023-06-12 DIAGNOSIS — R600 Localized edema: Secondary | ICD-10-CM | POA: Insufficient documentation

## 2023-06-12 DIAGNOSIS — I2119 ST elevation (STEMI) myocardial infarction involving other coronary artery of inferior wall: Secondary | ICD-10-CM | POA: Diagnosis not present

## 2023-06-12 DIAGNOSIS — I255 Ischemic cardiomyopathy: Secondary | ICD-10-CM | POA: Insufficient documentation

## 2023-06-12 DIAGNOSIS — I10 Essential (primary) hypertension: Secondary | ICD-10-CM | POA: Diagnosis present

## 2023-06-12 MED ORDER — REPATHA SURECLICK 140 MG/ML ~~LOC~~ SOAJ: 140.0000 mg | SUBCUTANEOUS | 3 refills | Status: DC

## 2023-06-12 MED ORDER — FUROSEMIDE 20 MG PO TABS: 20.0000 mg | ORAL_TABLET | Freq: Two times a day (BID) | ORAL | 3 refills | Status: DC

## 2023-06-12 NOTE — Telephone Encounter (Signed)
Left message for pt letting her know repatha PA was approved and prescription has been sent to her pharmacy. Call back number left.

## 2023-06-12 NOTE — Telephone Encounter (Signed)
Pharmacy Patient Advocate Encounter   Received notification from Pt Calls Messages that prior authorization for repatha is required/requested.   Insurance verification completed.   The patient is insured through  [employer]  .   Per test claim: PA required; PA submitted to [employer] via CoverMyMeds Key/confirmation #/EOC [id number] Status is pending

## 2023-06-12 NOTE — Progress Notes (Signed)
06/12/2023 Desiree Rasmussen   [birth year]  [id number]  Primary Physician Renford Dills, MD Primary Cardiologist: Runell Gess MD Desiree Rasmussen, [place]  HPI:  Desiree Rasmussen is a 82 y.o.  moderately overweight, married Caucasian female, mother of two, grandmother to two grandchildren, whose husband , Desiree Rasmussen, unfortunately passed away on 12-02-13. He was a patient of mine as well. He apparently died of complications of pneumonia.  I last saw her in the office 03/30/2022.  She has a history of hypertension and hyperlipidemia, as well as a family history of heart disease. She had chest pain and ruled out myocardial infarction on an ER visit earlier last year, and when I saw her she was complaining of atypical chest pain. A Myoview stress test performed in our office on May 01, 2012, was entirely normal. I thought her pain was more musculoskeletal and/or radicular and her symptoms subsequently resolved. Since I saw her a year ago she's remained fairly stable and is asymptomatic. She apparently needs a right total knee replacement by Dr. Charlann Boxer 06/11/17.   She presented with an inferior STEMI 12/10/2018 and had urgent cath by Dr. Kirke Corin via the right radial approach revealing an occluded RCA which was deemed to be an CTO with left-to-right collaterals, high-grade mid and distal LAD stenoses which were both stented with drug-eluting stents and a 50% mid AV groove circumflex stenosis.  Her EF at the time of cath was 35 to 45% and subsequent echo 45 to 50%.  She remains on aspirin and Brilinta was discontinued.   Since I saw her a year ago she has been stable.  She denies chest pain or shortness of breath.  She unfortunate was diagnosed with squamous cell cancer in her neck and has had surgery for this.  She also has begun on insulin for diabetes.  She is complained of bilateral lower extremity edema over the last year on furosemide but really denies increasing shortness of breath.   Current Meds   Medication Sig   amLODipine (NORVASC) 5 MG tablet Take 5 mg by mouth daily.   Ascorbic Acid (VITAMIN C PO) Take 500 mg by mouth every other day. Alternates days with multivitamin   aspirin 81 MG chewable tablet Chew 81 mg by mouth daily.   atorvastatin (LIPITOR) 80 MG tablet TAKE 1 TABLET BY MOUTH EVERY DAY AT 6PM (Patient taking differently: 40 mg.)   carvedilol (COREG) 25 MG tablet Take 25 mg by mouth 2 (two) times daily with a meal.   clorazepate (TRANXENE) 7.5 MG tablet Take 7.5 mg by mouth 2 (two) times daily as needed for anxiety.   Cyanocobalamin (VITAMIN B12) 1000 MCG TBCR Take 1,000 mcg by mouth daily.   fluticasone (FLONASE) 50 MCG/ACT nasal spray Place 2 sprays into both nostrils daily as needed for allergies or rhinitis.   furosemide (LASIX) 20 MG tablet Take 20 mg by mouth daily as needed for edema.   HYDROcodone-acetaminophen (NORCO/VICODIN) 5-325 MG tablet Take 1 tablet by mouth every 6 (six) hours as needed for moderate pain.   levothyroxine (SYNTHROID) 100 MCG tablet Take 100 mcg by mouth daily before breakfast.   lisinopril (ZESTRIL) 40 MG tablet Take 20 mg by mouth daily.   liver oil-zinc oxide (DESITIN) 40 % ointment Apply 1 application  topically as needed for irritation (UNDER THE BREASTS AND BETWEEN SKIN FOLDS).   loratadine (CLARITIN) 10 MG tablet Take 10 mg by mouth daily as needed for allergies or rhinitis.  Multiple Vitamins-Minerals (MULTIVITAMIN WITH MINERALS) tablet Take 1 tablet by mouth every other day. Alternates with vitamin c   nitroGLYCERIN (NITROSTAT) 0.4 MG SL tablet PLACE 1 TABLET UNDER TONGUE EVERY 5 MINUTES AS NEEDED FOR CHEST PAIN   omeprazole (PRILOSEC OTC) 20 MG tablet Take 20 mg by mouth daily as needed (acid reflux).   Polyvinyl Alcohol-Povidone (REFRESH OP) Place 1 drop into both eyes daily as needed (dry eyes).     Allergies  Allergen Reactions   Lexapro [Escitalopram Oxalate] Diarrhea   Crestor [Rosuvastatin]     Muscle and Joint Pain     Doxycycline Nausea And Vomiting    Upset stomach, also   Erythromycin Other (See Comments)    "upset stomach"   Hydrocodone Other (See Comments)    Affected mood negatively at 7.5-325 mg strength (tolerates 5-325)   Macrobid [Nitrofurantoin] Other (See Comments)    Didn't work   Sulfa Antibiotics Rash   Zoloft [Sertraline Hcl] Palpitations    Social History   Socioeconomic History   Marital status: Widowed    Spouse name: Not on file   Number of children: 2   Years of education: Not on file   Highest education level: Not on file  Occupational History   Not on file  Tobacco Use   Smoking status: Never   Smokeless tobacco: Never  Vaping Use   Vaping status: Never Used  Substance and Sexual Activity   Alcohol use: No   Drug use: Never   Sexual activity: Not Currently    Birth control/protection: Post-menopausal  Other Topics Concern   Not on file  Social History Narrative   Not on file   Social Determinants of Health   Financial Resource Strain: Not on file  Food Insecurity: Not on file  Transportation Needs: Not on file  Physical Activity: Not on file  Stress: Not on file  Social Connections: Unknown (02/13/2022)   Received from [facility]   Social Network    Social Network: Not on file  Intimate Partner Violence: Unknown (01/04/2022)   Received from [facility], [facility]   HITS    Physically Hurt: Not on file    Insult or Talk Down To: Not on file    Threaten Physical Harm: Not on file    Scream or Curse: Not on file     Review of Systems: General: negative for chills, fever, night sweats or weight changes.  Cardiovascular: negative for chest pain, dyspnea on exertion, edema, orthopnea, palpitations, paroxysmal nocturnal dyspnea or shortness of breath Dermatological: negative for rash Respiratory: negative for cough or wheezing Urologic: negative for hematuria Abdominal: negative for nausea, vomiting, diarrhea, bright red blood per  rectum, melena, or hematemesis Neurologic: negative for visual changes, syncope, or dizziness All other systems reviewed and are otherwise negative except as noted above.    Blood pressure 122/64, pulse 62, height 5\' 3"  (1.6 m), weight 191 lb (86.6 kg), SpO2 97%.  General appearance: alert and no distress Neck: no adenopathy, no carotid bruit, no JVD, supple, symmetrical, trachea midline, and thyroid not enlarged, symmetric, no tenderness/mass/nodules Lungs: clear to auscultation bilaterally Heart: Regular rate and rhythm without murmurs gallops rubs or clicks Extremities: 2+ edema bilaterally Pulses: 2+ and symmetric Skin: Skin color, texture, turgor normal. No rashes or lesions Neurologic: Grossly normal  EKG EKG Interpretation Date/Time:  Wednesday June 12 2023 08:47:29 EDT Ventricular Rate:  62 PR Interval:  138 QRS Duration:  98 QT Interval:  392 QTC Calculation: 397  R Axis:   -11  Text Interpretation: Normal sinus rhythm Cannot rule out Anterior infarct , age undetermined When compared with ECG of 11-Dec-2018 06:45, T wave inversion no longer evident in Inferior leads QT has shortened Confirmed by Nanetta Batty [id number]) on 06/12/2023 9:12:40 AM    ASSESSMENT AND PLAN:   Essential hypertension History of essential hypertension blood pressure measured today at 122/64.  She is on amlodipine 5 mg, carvedilol and lisinopril.  Hyperlipidemia History of hyperlipidemia on atorvastatin 40 mg (80 mg was prescribed)) but she is intolerant to this.  Her most recent lipid profile performed 12/21/2022 revealed total cholesterol of 169, LDL of 99 and HDL 49 not at goal for secondary prevention.  We will explore PCSK9 options.  Acute ST elevation myocardial infarction (STEMI) of inferior wall (HCC) History of CAD status post inferior STEMI 12/10/2018 with urgent cardiac catheterization performed via the right radial approach by Dr. Kirke Corin revealing an occluded RCA which appeared to be a  CTO with left-to-right collaterals, high-grade mid and distal LAD stenoses both of which were stented using drug-eluting stents and a 50% mid AV groove circumflex stenosis.  She remains on aspirin.  She is asymptomatic.  Ischemic cardiomyopathy Ejection fraction after her acute MI was 35 to 45% which ultimately improved to 45 to 50%.  She is on beta-blocker and ACE inhibitor.  She has complained of lower extreme edema.  Will recheck a cardiogram.  Bilateral lower extremity edema She has 1-2+ edema bilaterally which is new over the last year since I saw her last.  She is on furosemide 20 mg a day and wears compression stockings.  She is aware of her salt intake.  She denies shortness of breath however.  I am going to increase her furosemide from 20 to 40 mg a day, will check a basic metabolic panel in 7 to 10 days as well as a repeat 2D echocardiogram.  She will see an APP back in 4 to 6 weeks for follow-up.     Runell Gess MD FACP,FACC,FAHA, [facility] 06/12/2023 9:27 AM

## 2023-06-12 NOTE — Telephone Encounter (Signed)
Patient was in to see Dr. Allyson Sabal. Lipid consultation provided, patient reports she has been tolerating Lipitor 40 mg well. Her LDLc March 2024 was 99 mg/dl. Risk factors hx of MI and T2DM her LDLc goal <55 mg/dl.  Will assess coverage of PCSK9i. Patient educated for administration techniques, MOA, LDLc lowering effect and potential common and uncommon side effects.   Patient was concern about the co-pay cost.  If co-pay end up being high can provide some samples and can enroll her in grant when it opens early fall.

## 2023-06-12 NOTE — Assessment & Plan Note (Signed)
She has 1-2+ edema bilaterally which is new over the last year since I saw her last.  She is on furosemide 20 mg a day and wears compression stockings.  She is aware of her salt intake.  She denies shortness of breath however.  I am going to increase her furosemide from 20 to 40 mg a day, will check a basic metabolic panel in 7 to 10 days as well as a repeat 2D echocardiogram.  She will see an APP back in 4 to 6 weeks for follow-up.

## 2023-06-12 NOTE — Telephone Encounter (Signed)
Pharmacy Patient Advocate Encounter  Received notification from  [employer]  that Prior Authorization for repathahas been APPROVED from 06/12/23 to 10/01/23   PA #/Case ID/Reference #: [id number]

## 2023-06-12 NOTE — Patient Instructions (Addendum)
Medication Instructions:  Your physician has recommended you make the following change in your medication:   Increase furosemide (lasix) to 20mg  twice daily (morning & by 2pm)   *If you need a refill on your cardiac medications before your next appointment, please call your pharmacy*   Lab Work: Your physician recommends that you return for lab work in: 7-10 days- BMET  If you have labs (blood work) drawn today and your tests are completely normal, you will receive your results only by: [employer] (if you have MyChart) OR A paper copy in the mail If you have any lab test that is abnormal or we need to change your treatment, we will call you to review the results.   Testing/Procedures: Your physician has requested that you have an echocardiogram. Echocardiography is a painless test that uses sound waves to create images of your heart. It provides your doctor with information about the size and shape of your heart and how well your heart's chambers and valves are working. This procedure takes approximately one hour. There are no restrictions for this procedure. Please do NOT wear cologne, perfume, aftershave, or lotions (deodorant is allowed). Please arrive 15 minutes prior to your appointment time. This will take place at [address] [place]    Follow-Up: At [facility], you and your health needs are our priority.  As part of our continuing mission to provide you with exceptional heart care, we have created designated Provider Care Teams.  These Care Teams include your primary Cardiologist (physician) and Advanced Practice Providers (APPs -  Physician Assistants and Nurse Practitioners) who all work together to provide you with the care you need, when you need it.  We recommend signing up for the patient portal called "MyChart".  Sign up information is provided on this After Visit Summary.  MyChart is used to connect with patients for Virtual Visits (Telemedicine).   Patients are able to view lab/test results, encounter notes, upcoming appointments, etc.  Non-urgent messages can be sent to your provider as well.   To learn more about what you can do with MyChart, go to [email or web address].    Your next appointment:   4-6 week(s)  Provider:   Micah Flesher, PA-C, Marjie Skiff, PA-C, Robet Leu, PA-C, Juanda Crumble, PA-C, Azalee Course, PA-C, or Bernadene Person, NP      Then, Nanetta Batty, MD will plan to see you again in 12 month(s).

## 2023-06-12 NOTE — Assessment & Plan Note (Signed)
History of hyperlipidemia on atorvastatin 40 mg (80 mg was prescribed)) but she is intolerant to this.  Her most recent lipid profile performed 12/21/2022 revealed total cholesterol of 169, LDL of 99 and HDL 49 not at goal for secondary prevention.  We will explore PCSK9 options.

## 2023-06-12 NOTE — Assessment & Plan Note (Signed)
History of CAD status post inferior STEMI 12/10/2018 with urgent cardiac catheterization performed via the right radial approach by Dr. Kirke Corin revealing an occluded RCA which appeared to be a CTO with left-to-right collaterals, high-grade mid and distal LAD stenoses both of which were stented using drug-eluting stents and a 50% mid AV groove circumflex stenosis.  She remains on aspirin.  She is asymptomatic.

## 2023-06-12 NOTE — Assessment & Plan Note (Signed)
History of essential hypertension blood pressure measured today at 122/64.  She is on amlodipine 5 mg, carvedilol and lisinopril.

## 2023-06-12 NOTE — Addendum Note (Signed)
Addended by: Tylene Fantasia on: 06/12/2023 12:34 PM   Modules accepted: Orders

## 2023-06-12 NOTE — Assessment & Plan Note (Signed)
Ejection fraction after her acute MI was 35 to 45% which ultimately improved to 45 to 50%.  She is on beta-blocker and ACE inhibitor.  She has complained of lower extreme edema.  Will recheck a cardiogram.

## 2023-06-12 NOTE — Telephone Encounter (Signed)
PA request has been Submitted. New Encounter created for follow up. For additional info see Pharmacy Prior Auth telephone encounter from 06/12/23.

## 2023-06-13 DIAGNOSIS — D692 Other nonthrombocytopenic purpura: Secondary | ICD-10-CM | POA: Diagnosis not present

## 2023-06-13 DIAGNOSIS — D2262 Melanocytic nevi of left upper limb, including shoulder: Secondary | ICD-10-CM | POA: Diagnosis not present

## 2023-06-13 DIAGNOSIS — L720 Epidermal cyst: Secondary | ICD-10-CM | POA: Diagnosis not present

## 2023-06-13 DIAGNOSIS — L738 Other specified follicular disorders: Secondary | ICD-10-CM | POA: Diagnosis not present

## 2023-06-13 DIAGNOSIS — D1801 Hemangioma of skin and subcutaneous tissue: Secondary | ICD-10-CM | POA: Diagnosis not present

## 2023-06-13 DIAGNOSIS — Z85828 Personal history of other malignant neoplasm of skin: Secondary | ICD-10-CM | POA: Diagnosis not present

## 2023-06-13 DIAGNOSIS — L821 Other seborrheic keratosis: Secondary | ICD-10-CM | POA: Diagnosis not present

## 2023-06-13 DIAGNOSIS — L570 Actinic keratosis: Secondary | ICD-10-CM | POA: Diagnosis not present

## 2023-06-13 DIAGNOSIS — C44612 Basal cell carcinoma of skin of right upper limb, including shoulder: Secondary | ICD-10-CM | POA: Diagnosis not present

## 2023-06-13 DIAGNOSIS — L280 Lichen simplex chronicus: Secondary | ICD-10-CM | POA: Diagnosis not present

## 2023-06-24 DIAGNOSIS — I2119 ST elevation (STEMI) myocardial infarction involving other coronary artery of inferior wall: Secondary | ICD-10-CM | POA: Diagnosis not present

## 2023-06-24 DIAGNOSIS — R600 Localized edema: Secondary | ICD-10-CM | POA: Diagnosis not present

## 2023-06-24 DIAGNOSIS — I255 Ischemic cardiomyopathy: Secondary | ICD-10-CM | POA: Diagnosis not present

## 2023-06-24 DIAGNOSIS — E782 Mixed hyperlipidemia: Secondary | ICD-10-CM | POA: Diagnosis not present

## 2023-06-24 DIAGNOSIS — I10 Essential (primary) hypertension: Secondary | ICD-10-CM | POA: Diagnosis not present

## 2023-06-25 LAB — BASIC METABOLIC PANEL
BUN/Creatinine Ratio: 26 (ref 12–28)
BUN: 27 mg/dL (ref 8–27)
CO2: 28 mmol/L (ref 20–29)
Calcium: 9.9 mg/dL (ref 8.7–10.3)
Chloride: 100 mmol/L (ref 96–106)
Creatinine, Ser: 1.03 mg/dL — ABNORMAL HIGH (ref 0.57–1.00)
Glucose: 252 mg/dL — ABNORMAL HIGH (ref 70–99)
Potassium: 4.7 mmol/L (ref 3.5–5.2)
Sodium: 143 mmol/L (ref 134–144)
eGFR: 55 mL/min/{1.73_m2} — ABNORMAL LOW (ref >59)

## 2023-07-01 ENCOUNTER — Ambulatory Visit (HOSPITAL_COMMUNITY): Payer: Medicare Other | Attending: Cardiovascular Disease

## 2023-07-01 DIAGNOSIS — I255 Ischemic cardiomyopathy: Secondary | ICD-10-CM | POA: Insufficient documentation

## 2023-07-01 DIAGNOSIS — M79659 Pain in unspecified thigh: Secondary | ICD-10-CM | POA: Diagnosis not present

## 2023-07-01 DIAGNOSIS — E782 Mixed hyperlipidemia: Secondary | ICD-10-CM | POA: Diagnosis not present

## 2023-07-01 DIAGNOSIS — I2119 ST elevation (STEMI) myocardial infarction involving other coronary artery of inferior wall: Secondary | ICD-10-CM | POA: Diagnosis not present

## 2023-07-01 DIAGNOSIS — E7800 Pure hypercholesterolemia, unspecified: Secondary | ICD-10-CM | POA: Diagnosis not present

## 2023-07-01 DIAGNOSIS — I2510 Atherosclerotic heart disease of native coronary artery without angina pectoris: Secondary | ICD-10-CM | POA: Diagnosis not present

## 2023-07-01 DIAGNOSIS — R600 Localized edema: Secondary | ICD-10-CM | POA: Insufficient documentation

## 2023-07-01 DIAGNOSIS — E1122 Type 2 diabetes mellitus with diabetic chronic kidney disease: Secondary | ICD-10-CM | POA: Diagnosis not present

## 2023-07-01 DIAGNOSIS — N1831 Chronic kidney disease, stage 3a: Secondary | ICD-10-CM | POA: Diagnosis not present

## 2023-07-01 DIAGNOSIS — I10 Essential (primary) hypertension: Secondary | ICD-10-CM | POA: Diagnosis not present

## 2023-07-01 LAB — ECHOCARDIOGRAM COMPLETE
Area-P 1/2: 3.99 cm2
S' Lateral: 3.4 cm

## 2023-07-04 ENCOUNTER — Other Ambulatory Visit: Payer: Self-pay | Admitting: Cardiovascular Disease

## 2023-07-15 ENCOUNTER — Ambulatory Visit: Payer: Medicare Other | Attending: Nurse Practitioner | Admitting: Nurse Practitioner

## 2023-07-15 ENCOUNTER — Encounter: Payer: Self-pay | Admitting: Nurse Practitioner

## 2023-07-15 VITALS — BP 115/50 | HR 84 | Ht 63.0 in | Wt 186.0 lb

## 2023-07-15 DIAGNOSIS — E785 Hyperlipidemia, unspecified: Secondary | ICD-10-CM | POA: Insufficient documentation

## 2023-07-15 DIAGNOSIS — E119 Type 2 diabetes mellitus without complications: Secondary | ICD-10-CM | POA: Diagnosis not present

## 2023-07-15 DIAGNOSIS — I2510 Atherosclerotic heart disease of native coronary artery without angina pectoris: Secondary | ICD-10-CM | POA: Insufficient documentation

## 2023-07-15 DIAGNOSIS — I10 Essential (primary) hypertension: Secondary | ICD-10-CM | POA: Insufficient documentation

## 2023-07-15 DIAGNOSIS — R600 Localized edema: Secondary | ICD-10-CM | POA: Insufficient documentation

## 2023-07-15 DIAGNOSIS — G4733 Obstructive sleep apnea (adult) (pediatric): Secondary | ICD-10-CM | POA: Insufficient documentation

## 2023-07-15 NOTE — Progress Notes (Signed)
Office Visit    Patient Name: Desiree Rasmussen Date of Encounter: 07/15/2023  Primary Care Provider:  Renford Dills, MD Primary Cardiologist:  Nanetta Batty, MD  Chief Complaint    82 year old female with a history of CAD s/p STEMI in 11/2018, DES-mLAD and dLAD, CTO-RCA, mild mitral valve regurgitation, hypertension, hyperlipidemia, type 2 diabetes, GERD, and BPPV who presents for follow-up related to bilateral lower extremity edema.  Past Medical History    Past Medical History:  Diagnosis Date   BPPV (benign paroxysmal positional vertigo), left    Cancer (HCC)    Skin Cancer. Areas removed by Dermatologist   Depression    Family history of heart disease    Fatty liver    GERD (gastroesophageal reflux disease)    History of hiatal hernia    History of nuclear stress test 05/01/2012   dr berry   normal lexiscan nuclear study w/ no ischemia/  normal LV function and wall motion , ef 73%   Hyperlipidemia    Hypertension    Hypothyroidism    Myocardial infarction (HCC) 2020   OA (osteoarthritis)    Type 2 diabetes mellitus (HCC)    Wears contact lenses    left eye only   Wears partial dentures    upper   Past Surgical History:  Procedure Laterality Date   CARPAL TUNNEL RELEASE Bilateral 2018   CATARACT EXTRACTION W/ INTRAOCULAR LENS  IMPLANT, BILATERAL  06/2018   CESAREAN SECTION  1969;  1972   COLONOSCOPY  2013   CORONARY/GRAFT ACUTE MI REVASCULARIZATION N/A 12/10/2018   Procedure: CORONARY/GRAFT ACUTE MI REVASCULARIZATION;  Surgeon: Iran Ouch, MD;  Location: [facility];  Service: Cardiovascular;  Laterality: N/A;   CYSTECTOMY Left    sebaceous cyst on breast   EXCISION MASS NECK Left 08/10/2022   Procedure: EXCISION NECK TUMOR;  Surgeon: Scarlette Ar, MD;  Location: [facility] OR;  Service: ENT;  Laterality: Left;   LEFT HEART CATH AND CORONARY ANGIOGRAPHY N/A 12/10/2018   Procedure: LEFT HEART CATH AND CORONARY ANGIOGRAPHY;  Surgeon: Iran Ouch,  MD;  Location: [facility];  Service: Cardiovascular;  Laterality: N/A;   TOTAL KNEE ARTHROPLASTY Right 06/11/2017   Procedure: RIGHT TOTAL KNEE ARTHROPLASTY;  Surgeon: Durene Romans, MD;  Location: [facility];  Service: Orthopedics;  Laterality: Right;  70 mins   TOTAL KNEE ARTHROPLASTY Left 08/19/2018   Procedure: TOTAL KNEE ARTHROPLASTY;  Surgeon: Durene Romans, MD;  Location: [facility];  Service: Orthopedics;  Laterality: Left;    Allergies  Allergies  Allergen Reactions   Lexapro [Escitalopram Oxalate] Diarrhea   Crestor [Rosuvastatin]     Muscle and Joint Pain    Doxycycline Nausea And Vomiting    Upset stomach, also   Erythromycin Other (See Comments)    "upset stomach"   Hydrocodone Other (See Comments)    Affected mood negatively at 7.5-325 mg strength (tolerates 5-325)   Macrobid [Nitrofurantoin] Other (See Comments)    Didn't work   Sulfa Antibiotics Rash   Zoloft [Sertraline Hcl] Palpitations     Labs/Other Studies Reviewed    The following studies were reviewed today:  Cardiac Studies & Procedures   CARDIAC CATHETERIZATION  CARDIAC CATHETERIZATION 12/10/2018  Narrative  Prox RCA to Mid RCA lesion is 100% stenosed.  Prox LAD lesion is 99% stenosed.  Post intervention, there is a 0% residual stenosis.  A drug-eluting stent was successfully placed using a STENT RESOLUTE ONYX 2.5X15.  Ost 1st Diag lesion is 40%  stenosed.  Mid LAD lesion is 80% stenosed.  Post intervention, there is a 5% residual stenosis.  A drug-eluting stent was successfully placed using a STENT RESOLUTE ONYX 2.0X18.  Mid Cx lesion is 50% stenosed.  LV end diastolic pressure is mildly elevated.  The left ventricular ejection fraction is 35-45% by visual estimate.  There is moderate left ventricular systolic dysfunction.  1.  Severe two-vessel coronary artery disease.  The patient presented with inferior ST elevation myocardial infarction with symptoms that started about 2 weeks  prior to presentation.  The culprit for her STEMI is likely an acute on chronic occlusion in the RCA.  There is also high-grade stenosis in the proximal LAD and mid LAD.  Moderate disease in the left circumflex. 2.  Moderately reduced LV systolic function with an EF of 35 to 40%.  Mildly elevated left ventricular end-diastolic pressure. 3. Inability to cross the occlusion in the right coronary artery. 4. Successful angioplasty and drug-eluting stent placement to the LAD x2.  Recommendations: Dual antiplatelet therapy for at least 1 year. The patient will likely finish an inferior infarct but she has reasonable left-to-right collaterals. The patient's chest pain improved at the end of the procedure but as expected, did not resolve and thus I elected to start her on nitroglycerin drip.  She also continued to have inferior ST elevation in her EKG and this is also expected.  Findings Coronary Findings Diagnostic  Dominance: Right  Left Anterior Descending Prox LAD lesion is 99% stenosed. The lesion is type C and located at the major branch. The lesion is moderately calcified. Mid LAD lesion is 80% stenosed. The lesion is not complex (non high-C). The lesion is mildly calcified.  First Diagonal Branch Ost 1st Diag lesion is 40% stenosed.  Left Circumflex Mid Cx lesion is 50% stenosed.  Right Coronary Artery Prox RCA to Mid RCA lesion is 100% stenosed. The lesion is type C.  Intervention  Prox LAD lesion Stent Lesion length:  13 mm. CATHETER LAUNCHER 6FREBU 3.5 guide catheter was inserted. Lesion crossed with guidewire using a WIRE RUNTHROUGH .[id number]. Pre-stent angioplasty was performed using a BALLOON SAPPHIRE 2.5X12. Maximum pressure:  8 atm. Inflation time:  15 sec. A drug-eluting stent was successfully placed using a STENT RESOLUTE ONYX 2.5X15. Maximum pressure: 14 atm. Inflation time: 15 sec. Post-stent angioplasty was performed using a BALLOON SAPPHIRE [place] [id number]. Maximum pressure:   14 atm. Inflation time:  15 sec. Post-Intervention Lesion Assessment The intervention was successful. Pre-interventional TIMI flow is 3. Post-intervention TIMI flow is 3. No complications occurred at this lesion. There is a 0% residual stenosis post intervention.  Mid LAD lesion Stent Lesion length:  17 mm. Pre-stent angioplasty was not performed. A drug-eluting stent was successfully placed using a STENT RESOLUTE ONYX 2.0X18. Maximum pressure: 12 atm. Inflation time: 15 sec. Stent does not overlap previously placed stentPost-stent angioplasty was performed using a BALLOON [place] EMERGE MR 2.25X15. Maximum pressure:  14 atm. Inflation time:  15 sec. Post-Intervention Lesion Assessment The intervention was successful. Pre-interventional TIMI flow is 3. Post-intervention TIMI flow is 3. No complications occurred at this lesion. There is a 5% residual stenosis post intervention.   STRESS TESTS  NM MYOCAR MULTI W/SPECT W 05/01/2012   ECHOCARDIOGRAM  ECHOCARDIOGRAM COMPLETE 07/01/2023  Narrative ECHOCARDIOGRAM REPORT    Patient Name:   Desiree Rasmussen Date of Exam: 07/01/2023 Medical Rec #:  [id number]       Height:       63.0 in Accession #:  [id number]      Weight:       191.0 lb Date of Birth:  10-17-1940       BSA:          1.896 m Patient Age:    82 years        BP:           122/64 mmHg Patient Gender: F               HR:           66 bpm. Exam Location:  [address]  Procedure: 2D Echo, Cardiac Doppler, Color Doppler and 3D Echo  Indications:    Hypertension I10; Ischemic Cardiomyopathy I25.5  History:        Patient has prior history of Echocardiogram examinations, most recent 05/13/2019. CAD and Previous Myocardial Infarction; Risk Factors:Diabetes, Hypertension and Dyslipidemia.  Sonographer:    Thurman Coyer RDCS Referring Phys: [phone] [place] See BERRY   Sonographer Comments: Global longitudinal strain was attempted. IMPRESSIONS   1. Left ventricular ejection  fraction, by estimation, is 55 to 60%. The left ventricle has normal function. The left ventricle has no regional wall motion abnormalities. Left ventricular diastolic parameters are consistent with Grade I diastolic dysfunction (impaired relaxation). 2. Right ventricular systolic function is normal. The right ventricular size is normal. There is normal pulmonary artery systolic pressure. The estimated right ventricular systolic pressure is 23.6 mmHg. 3. The mitral valve is normal in structure. Mild mitral valve regurgitation. No evidence of mitral stenosis. 4. The aortic valve is tricuspid. There is mild calcification of the aortic valve. Aortic valve regurgitation is not visualized. No aortic stenosis is present. 5. The inferior vena cava is normal in size with greater than 50% respiratory variability, suggesting right atrial pressure of 3 mmHg.  FINDINGS Left Ventricle: Left ventricular ejection fraction, by estimation, is 55 to 60%. The left ventricle has normal function. The left ventricle has no regional wall motion abnormalities. The left ventricular internal cavity size was normal in size. There is no left ventricular hypertrophy. Left ventricular diastolic parameters are consistent with Grade I diastolic dysfunction (impaired relaxation).  Right Ventricle: The right ventricular size is normal. No increase in right ventricular wall thickness. Right ventricular systolic function is normal. There is normal pulmonary artery systolic pressure. The tricuspid regurgitant velocity is 2.27 m/s, and with an assumed right atrial pressure of 3 mmHg, the estimated right ventricular systolic pressure is 23.6 mmHg.  Left Atrium: Left atrial size was normal in size.  Right Atrium: Right atrial size was normal in size.  Pericardium: There is no evidence of pericardial effusion.  Mitral Valve: The mitral valve is normal in structure. Mild mitral annular calcification. Mild mitral valve regurgitation. No  evidence of mitral valve stenosis.  Tricuspid Valve: The tricuspid valve is normal in structure. Tricuspid valve regurgitation is trivial.  Aortic Valve: The aortic valve is tricuspid. There is mild calcification of the aortic valve. Aortic valve regurgitation is not visualized. No aortic stenosis is present.  Pulmonic Valve: The pulmonic valve was normal in structure. Pulmonic valve regurgitation is trivial.  Aorta: The aortic root is normal in size and structure.  Venous: The inferior vena cava is normal in size with greater than 50% respiratory variability, suggesting right atrial pressure of 3 mmHg.  IAS/Shunts: No atrial level shunt detected by color flow Doppler.   LEFT VENTRICLE PLAX 2D LVIDd:         4.90 cm   Diastology LVIDs:  3.40 cm   LV e' medial:    6.00 cm/s LV PW:         0.80 cm   LV E/e' medial:  13.4 LV IVS:        0.70 cm   LV e' lateral:   7.54 cm/s LVOT diam:     2.00 cm   LV E/e' lateral: 10.7 LV SV:         65 LV SV Index:   34 LVOT Area:     3.14 cm  3D Volume EF: 3D EF:        56 % LV EDV:       115 ml LV ESV:       51 ml LV SV:        64 ml  RIGHT VENTRICLE RV S prime:     11.40 cm/s TAPSE (M-mode): 2.2 cm  LEFT ATRIUM           Index LA diam:      4.00 cm 2.11 cm/m LA Vol (A2C): 38.4 ml 20.25 ml/m LA Vol (A4C): 35.8 ml 18.88 ml/m AORTIC VALVE LVOT Vmax:   94.30 cm/s LVOT Vmean:  61.300 cm/s LVOT VTI:    0.208 m  AORTA Ao Root diam: 2.80 cm Ao Asc diam:  3.40 cm  MITRAL VALVE                TRICUSPID VALVE MV Area (PHT): 3.99 cm     TR Peak grad:   20.6 mmHg MV Decel Time: 190 msec     TR Vmax:        227.00 cm/s MV E velocity: 80.70 cm/s MV A velocity: 100.00 cm/s  SHUNTS MV E/A ratio:  0.81         Systemic VTI:  0.21 m Systemic Diam: 2.00 cm  Dalton McleanMD Electronically signed by Wilfred Lacy Signature Date/Time: 07/01/2023/11:14:41 AM    Final            Recent Labs: 08/01/2022: ALT 18; Hemoglobin 13.9;  Platelets 143 06/24/2023: BUN 27; Creatinine, Ser 1.03; Potassium 4.7; Sodium 143  Recent Lipid Panel    Component Value Date/Time   CHOL 175 12/10/2018 1500   TRIG 276 (H) 12/10/2018 1500   HDL 43 12/10/2018 1500   CHOLHDL 4.1 12/10/2018 1500   VLDL 55 (H) 12/10/2018 1500   LDLCALC 77 12/10/2018 1500    History of Present Illness    82 year old female with with the above past medical history including CAD s/p STEMI in 11/2018, DES-mLAD and dLAD, CTO-RCA, mild mitral valve regurgitation, hypertension, hyperlipidemia, type 2 diabetes, GERD, and BPPV.  She has a history of CAD s/p inferior STEMI in 11/2018.  Cardiac catheterization revealed occluded RCA which appeared to be a CTO with left-to-right collaterals, high-grade mid and distal LAD stenoses s/p DES, 50% mid AV groove circumflex stenosis.  EF at the time of cath was 35 to 45%.  Subsequent echo revealed EF 45 to 50%.  She was last seen in the office on 06/12/2023 for noted bilateral lower extremity edema.  Lasix was increased to 40 mg daily.  Repeat echocardiogram in 06/2023 showed EF 55 to 60%, normal LV function, no RWMA, G1 DD, normal RV, mild mitral valve regurgitation.  She presents today for follow-up accompanied by her daughter.  Since her last visit she has been stable overall from a cardiac standpoint.  She has noted significant improvement in her lower extremity edema with increased Lasix dosing, however, she does note  some urinary urgency, increased urinary incontinence.  She notes occasional lightheadedness with position changes, this seems to have worsened since increasing her Lasix.  She denies any palpitations, presyncope or syncope.  BP has been stable overall.    Home Medications    Current Outpatient Medications  Medication Sig Dispense Refill   amLODipine (NORVASC) 5 MG tablet Take 5 mg by mouth daily.     Ascorbic Acid (VITAMIN C PO) Take 500 mg by mouth every other day. Alternates days with multivitamin     aspirin 81  MG chewable tablet Chew 81 mg by mouth daily.     atorvastatin (LIPITOR) 80 MG tablet TAKE 1 TABLET BY MOUTH EVERY DAY AT 6PM 90 tablet 0   carvedilol (COREG) 25 MG tablet Take 25 mg by mouth 2 (two) times daily with a meal.     clorazepate (TRANXENE) 7.5 MG tablet Take 7.5 mg by mouth 2 (two) times daily as needed for anxiety.     Cyanocobalamin (VITAMIN B12) 1000 MCG TBCR Take 1,000 mcg by mouth daily.     fluticasone (FLONASE) 50 MCG/ACT nasal spray Place 2 sprays into both nostrils daily as needed for allergies or rhinitis.     furosemide (LASIX) 20 MG tablet Take 1 tablet (20 mg total) by mouth 2 (two) times daily. (Patient taking differently: Take 20 mg by mouth daily. Take 20 mg daily and may take an additional 20 mg on the days of increased swelling or weight gain) 60 tablet 3   HYDROcodone-acetaminophen (NORCO/VICODIN) 5-325 MG tablet Take 1 tablet by mouth every 6 (six) hours as needed for moderate pain. 5 tablet 0   levothyroxine (SYNTHROID) 100 MCG tablet Take 100 mcg by mouth daily before breakfast.     lisinopril (ZESTRIL) 40 MG tablet Take 20 mg by mouth daily.     liver oil-zinc oxide (DESITIN) 40 % ointment Apply 1 application  topically as needed for irritation (UNDER THE BREASTS AND BETWEEN SKIN FOLDS).     loratadine (CLARITIN) 10 MG tablet Take 10 mg by mouth daily as needed for allergies or rhinitis.     Multiple Vitamins-Minerals (MULTIVITAMIN WITH MINERALS) tablet Take 1 tablet by mouth every other day. Alternates with vitamin c     nitroGLYCERIN (NITROSTAT) 0.4 MG SL tablet PLACE 1 TABLET UNDER TONGUE EVERY 5 MINUTES AS NEEDED FOR CHEST PAIN 25 tablet 2   omeprazole (PRILOSEC OTC) 20 MG tablet Take 20 mg by mouth daily as needed (acid reflux).     Polyvinyl Alcohol-Povidone (REFRESH OP) Place 1 drop into both eyes daily as needed (dry eyes).     Evolocumab (REPATHA SURECLICK) 140 MG/ML SOAJ Inject 140 mg into the skin every 14 (fourteen) days. (Patient not taking: Reported on  07/15/2023) 6 mL 3   No current facility-administered medications for this visit.     Review of Systems    She denies chest pain, palpitations, dyspnea, pnd, orthopnea, n, v, dizziness, syncope, weight gain, or early satiety. All other systems reviewed and are otherwise negative except as noted above.   Physical Exam    VS:  BP (!) 115/50 (BP Location: Left Arm, Patient Position: Sitting, Cuff Size: Normal)   Pulse 84   Ht 5\' 3"  (1.6 m)   Wt 186 lb (84.4 kg)   SpO2 94%   BMI 32.95 kg/m   GEN: Well nourished, well developed, in no acute distress. HEENT: normal. Neck: Supple, no JVD, carotid bruits, or masses. Cardiac: RRR, no murmurs, rubs, or gallops. No  clubbing, cyanosis, nonpitting bilateral lower extremity edema.  Radials/DP/PT 2+ and equal bilaterally.  Respiratory:  Respirations regular and unlabored, clear to auscultation bilaterally. GI: Soft, nontender, nondistended, BS + x 4. MS: no deformity or atrophy. Skin: warm and dry, no rash. Neuro:  Strength and sensation are intact. Psych: Normal affect.  Accessory Clinical Findings    ECG personally reviewed by me today -    - no EKG in office today.    Lab Results  Component Value Date   WBC 6.8 08/01/2022   HGB 13.9 08/01/2022   HCT 43.1 08/01/2022   MCV 91.3 08/01/2022   PLT 143 (L) 08/01/2022   Lab Results  Component Value Date   CREATININE 1.03 (H) 06/24/2023   BUN 27 06/24/2023   NA 143 06/24/2023   K 4.7 06/24/2023   CL 100 06/24/2023   CO2 28 06/24/2023   Lab Results  Component Value Date   ALT 18 08/01/2022   AST 17 08/01/2022   ALKPHOS 57 08/01/2022   BILITOT 0.6 08/01/2022   Lab Results  Component Value Date   CHOL 175 12/10/2018   HDL 43 12/10/2018   LDLCALC 77 12/10/2018   TRIG 276 (H) 12/10/2018   CHOLHDL 4.1 12/10/2018    Lab Results  Component Value Date   HGBA1C 7.6 (H) 08/01/2022    Assessment & Plan    1. CAD: S/p STEMI in 11/2018, DES-mLAD and dLAD, CTO-RCA. Stable with no  anginal symptoms. No indication for ischemic evaluation.  Continue aspirin, carvedilol, amlodipine, lisinopril, Lipitor.   2. Bilateral lower extremity edema: Most recent echocardiogram in 06/2023 showed EF 55 to 60%, normal LV function, no RWMA, G1 DD, normal RV, mild mitral valve regurgitation.  She has nonpitting bilateral lower extremity edema, she states this has improved significantly since increasing her Lasix.  She does note that she has had significant urinary urgency and increased urinary incontinence since increasing her Lasix dosing.  Weight is down.  She also notes intermittent lightheadedness with position changes, this has worsened since increasing her Lasix.  BP has been stable.  Through shared decision making, will reduce Lasix to 20 mg daily.  She will take an additional 20 mg daily as needed for swelling, weight gain.  Reviewed daily weights, sodium recommendations.  She will notify us of any worsening symptoms.   3. Hypertension: BP well controlled. Continue current antihypertensive regimen.   4. Hyperlipidemia: LDL was 99 in 11/2022.  She is awaiting assistance from the [employer] for Repatha, following with Pharm.D. Continue Lipitor.  5.  Mitral valve regurgitation: Mild on most recent echo.  Consider repeat echocardiogram as clinically indicated.  6. Type 2 diabetes: A1c was 7.6 in 04/2023.  Monitored and managed per PCP.   7. Disposition:   Follow-up per recall with Dr. Allyson Sabal in 06/2024, sooner if needed.      Joylene Grapes, NP 07/15/2023, 7:40 PM

## 2023-07-15 NOTE — Patient Instructions (Signed)
Medication Instructions:  Lasix (Furosemide) 20 mg daily. May take an additional 20 mg on the days of increased swelling and weight gain.  *If you need a refill on your cardiac medications before your next appointment, please call your pharmacy*   Lab Work: NONE ordered at this time of appointment   Testing/Procedures: NONE ordered at this time of appointment   Follow-Up: At [facility], you and your health needs are our priority.  As part of our continuing mission to provide you with exceptional heart care, we have created designated Provider Care Teams.  These Care Teams include your primary Cardiologist (physician) and Advanced Practice Providers (APPs -  Physician Assistants and Nurse Practitioners) who all work together to provide you with the care you need, when you need it.  We recommend signing up for the patient portal called "MyChart".  Sign up information is provided on this After Visit Summary.  MyChart is used to connect with patients for Virtual Visits (Telemedicine).  Patients are able to view lab/test results, encounter notes, upcoming appointments, etc.  Non-urgent messages can be sent to your provider as well.   To learn more about what you can do with MyChart, go to [email or web address].    Your next appointment:    September 2025  Provider:   Dr Allyson Sabal.

## 2023-07-29 DIAGNOSIS — E1122 Type 2 diabetes mellitus with diabetic chronic kidney disease: Secondary | ICD-10-CM | POA: Diagnosis not present

## 2023-07-29 DIAGNOSIS — E7800 Pure hypercholesterolemia, unspecified: Secondary | ICD-10-CM | POA: Diagnosis not present

## 2023-07-30 ENCOUNTER — Encounter: Payer: Self-pay | Admitting: Pharmacist

## 2023-08-13 DIAGNOSIS — Z23 Encounter for immunization: Secondary | ICD-10-CM | POA: Diagnosis not present

## 2023-08-13 DIAGNOSIS — C7989 Secondary malignant neoplasm of other specified sites: Secondary | ICD-10-CM | POA: Diagnosis not present

## 2023-08-13 DIAGNOSIS — C4492 Squamous cell carcinoma of skin, unspecified: Secondary | ICD-10-CM | POA: Diagnosis not present

## 2023-09-03 DIAGNOSIS — M7061 Trochanteric bursitis, right hip: Secondary | ICD-10-CM | POA: Diagnosis not present

## 2023-09-03 DIAGNOSIS — M25511 Pain in right shoulder: Secondary | ICD-10-CM | POA: Diagnosis not present

## 2023-09-03 DIAGNOSIS — M25512 Pain in left shoulder: Secondary | ICD-10-CM | POA: Diagnosis not present

## 2023-09-07 ENCOUNTER — Other Ambulatory Visit: Payer: Self-pay | Admitting: Cardiovascular Disease

## 2023-10-03 ENCOUNTER — Other Ambulatory Visit: Payer: Self-pay | Admitting: Cardiovascular Disease

## 2023-10-07 DIAGNOSIS — I10 Essential (primary) hypertension: Secondary | ICD-10-CM | POA: Diagnosis not present

## 2023-10-07 DIAGNOSIS — E1169 Type 2 diabetes mellitus with other specified complication: Secondary | ICD-10-CM | POA: Diagnosis not present

## 2023-10-07 DIAGNOSIS — E538 Deficiency of other specified B group vitamins: Secondary | ICD-10-CM | POA: Diagnosis not present

## 2023-10-07 DIAGNOSIS — J069 Acute upper respiratory infection, unspecified: Secondary | ICD-10-CM | POA: Diagnosis not present

## 2023-10-07 DIAGNOSIS — I700 Atherosclerosis of aorta: Secondary | ICD-10-CM | POA: Diagnosis not present

## 2023-10-07 DIAGNOSIS — G8929 Other chronic pain: Secondary | ICD-10-CM | POA: Diagnosis not present

## 2023-10-07 DIAGNOSIS — E7800 Pure hypercholesterolemia, unspecified: Secondary | ICD-10-CM | POA: Diagnosis not present

## 2023-10-07 DIAGNOSIS — Z03818 Encounter for observation for suspected exposure to other biological agents ruled out: Secondary | ICD-10-CM | POA: Diagnosis not present

## 2023-10-07 DIAGNOSIS — E039 Hypothyroidism, unspecified: Secondary | ICD-10-CM | POA: Diagnosis not present

## 2023-10-07 DIAGNOSIS — E1122 Type 2 diabetes mellitus with diabetic chronic kidney disease: Secondary | ICD-10-CM | POA: Diagnosis not present

## 2023-10-07 DIAGNOSIS — I2510 Atherosclerotic heart disease of native coronary artery without angina pectoris: Secondary | ICD-10-CM | POA: Diagnosis not present

## 2023-10-07 DIAGNOSIS — E663 Overweight: Secondary | ICD-10-CM | POA: Diagnosis not present

## 2023-10-07 DIAGNOSIS — R059 Cough, unspecified: Secondary | ICD-10-CM | POA: Diagnosis not present

## 2023-10-29 DIAGNOSIS — M25551 Pain in right hip: Secondary | ICD-10-CM | POA: Diagnosis not present

## 2023-10-29 DIAGNOSIS — M51362 Other intervertebral disc degeneration, lumbar region with discogenic back pain and lower extremity pain: Secondary | ICD-10-CM | POA: Diagnosis not present

## 2023-10-31 DIAGNOSIS — M25551 Pain in right hip: Secondary | ICD-10-CM | POA: Diagnosis not present

## 2023-11-12 DIAGNOSIS — Z1231 Encounter for screening mammogram for malignant neoplasm of breast: Secondary | ICD-10-CM | POA: Diagnosis not present

## 2023-11-26 DIAGNOSIS — K219 Gastro-esophageal reflux disease without esophagitis: Secondary | ICD-10-CM | POA: Diagnosis not present

## 2023-11-26 DIAGNOSIS — C7989 Secondary malignant neoplasm of other specified sites: Secondary | ICD-10-CM | POA: Diagnosis not present

## 2023-11-26 DIAGNOSIS — C4492 Squamous cell carcinoma of skin, unspecified: Secondary | ICD-10-CM | POA: Diagnosis not present

## 2023-12-10 DIAGNOSIS — H109 Unspecified conjunctivitis: Secondary | ICD-10-CM | POA: Diagnosis not present

## 2024-01-15 DIAGNOSIS — F419 Anxiety disorder, unspecified: Secondary | ICD-10-CM | POA: Diagnosis not present

## 2024-01-15 DIAGNOSIS — F331 Major depressive disorder, recurrent, moderate: Secondary | ICD-10-CM | POA: Diagnosis not present

## 2024-01-15 DIAGNOSIS — I2510 Atherosclerotic heart disease of native coronary artery without angina pectoris: Secondary | ICD-10-CM | POA: Diagnosis not present

## 2024-01-15 DIAGNOSIS — N3281 Overactive bladder: Secondary | ICD-10-CM | POA: Diagnosis not present

## 2024-01-15 DIAGNOSIS — I10 Essential (primary) hypertension: Secondary | ICD-10-CM | POA: Diagnosis not present

## 2024-01-15 DIAGNOSIS — E039 Hypothyroidism, unspecified: Secondary | ICD-10-CM | POA: Diagnosis not present

## 2024-01-15 DIAGNOSIS — M5442 Lumbago with sciatica, left side: Secondary | ICD-10-CM | POA: Diagnosis not present

## 2024-01-15 DIAGNOSIS — E1122 Type 2 diabetes mellitus with diabetic chronic kidney disease: Secondary | ICD-10-CM | POA: Diagnosis not present

## 2024-01-15 DIAGNOSIS — M8580 Other specified disorders of bone density and structure, unspecified site: Secondary | ICD-10-CM | POA: Diagnosis not present

## 2024-01-15 DIAGNOSIS — Z0000 Encounter for general adult medical examination without abnormal findings: Secondary | ICD-10-CM | POA: Diagnosis not present

## 2024-01-15 DIAGNOSIS — E7800 Pure hypercholesterolemia, unspecified: Secondary | ICD-10-CM | POA: Diagnosis not present

## 2024-01-28 DIAGNOSIS — M25511 Pain in right shoulder: Secondary | ICD-10-CM | POA: Diagnosis not present

## 2024-01-28 DIAGNOSIS — M25512 Pain in left shoulder: Secondary | ICD-10-CM | POA: Diagnosis not present

## 2024-02-08 DIAGNOSIS — M545 Low back pain, unspecified: Secondary | ICD-10-CM | POA: Diagnosis not present

## 2024-03-04 DIAGNOSIS — C44629 Squamous cell carcinoma of skin of left upper limb, including shoulder: Secondary | ICD-10-CM | POA: Diagnosis not present

## 2024-03-04 DIAGNOSIS — Z85828 Personal history of other malignant neoplasm of skin: Secondary | ICD-10-CM | POA: Diagnosis not present

## 2024-03-24 DIAGNOSIS — C7989 Secondary malignant neoplasm of other specified sites: Secondary | ICD-10-CM | POA: Diagnosis not present

## 2024-03-24 DIAGNOSIS — K219 Gastro-esophageal reflux disease without esophagitis: Secondary | ICD-10-CM | POA: Diagnosis not present

## 2024-03-24 DIAGNOSIS — C4492 Squamous cell carcinoma of skin, unspecified: Secondary | ICD-10-CM | POA: Diagnosis not present

## 2024-04-12 DIAGNOSIS — M51362 Other intervertebral disc degeneration, lumbar region with discogenic back pain and lower extremity pain: Secondary | ICD-10-CM | POA: Diagnosis not present

## 2024-04-12 DIAGNOSIS — M549 Dorsalgia, unspecified: Secondary | ICD-10-CM | POA: Diagnosis not present

## 2024-04-12 DIAGNOSIS — M79661 Pain in right lower leg: Secondary | ICD-10-CM | POA: Diagnosis not present

## 2024-04-12 DIAGNOSIS — M5416 Radiculopathy, lumbar region: Secondary | ICD-10-CM | POA: Diagnosis not present

## 2024-04-22 DIAGNOSIS — M51362 Other intervertebral disc degeneration, lumbar region with discogenic back pain and lower extremity pain: Secondary | ICD-10-CM | POA: Diagnosis not present

## 2024-04-22 DIAGNOSIS — M5416 Radiculopathy, lumbar region: Secondary | ICD-10-CM | POA: Diagnosis not present

## 2024-04-30 DIAGNOSIS — H35033 Hypertensive retinopathy, bilateral: Secondary | ICD-10-CM | POA: Diagnosis not present

## 2024-04-30 DIAGNOSIS — E119 Type 2 diabetes mellitus without complications: Secondary | ICD-10-CM | POA: Diagnosis not present

## 2024-04-30 DIAGNOSIS — H26491 Other secondary cataract, right eye: Secondary | ICD-10-CM | POA: Diagnosis not present

## 2024-04-30 DIAGNOSIS — I10 Essential (primary) hypertension: Secondary | ICD-10-CM | POA: Diagnosis not present

## 2024-05-12 DIAGNOSIS — M25512 Pain in left shoulder: Secondary | ICD-10-CM | POA: Diagnosis not present

## 2024-05-12 DIAGNOSIS — M51362 Other intervertebral disc degeneration, lumbar region with discogenic back pain and lower extremity pain: Secondary | ICD-10-CM | POA: Diagnosis not present

## 2024-05-12 DIAGNOSIS — Z5181 Encounter for therapeutic drug level monitoring: Secondary | ICD-10-CM | POA: Diagnosis not present

## 2024-05-12 DIAGNOSIS — M5416 Radiculopathy, lumbar region: Secondary | ICD-10-CM | POA: Diagnosis not present

## 2024-05-12 DIAGNOSIS — M419 Scoliosis, unspecified: Secondary | ICD-10-CM | POA: Diagnosis not present

## 2024-05-12 DIAGNOSIS — M25511 Pain in right shoulder: Secondary | ICD-10-CM | POA: Diagnosis not present

## 2024-05-12 DIAGNOSIS — Z79899 Other long term (current) drug therapy: Secondary | ICD-10-CM | POA: Diagnosis not present

## 2024-05-28 DIAGNOSIS — Z961 Presence of intraocular lens: Secondary | ICD-10-CM | POA: Diagnosis not present

## 2024-05-28 DIAGNOSIS — H18413 Arcus senilis, bilateral: Secondary | ICD-10-CM | POA: Diagnosis not present

## 2024-05-28 DIAGNOSIS — I10 Essential (primary) hypertension: Secondary | ICD-10-CM | POA: Diagnosis not present

## 2024-05-28 DIAGNOSIS — H26491 Other secondary cataract, right eye: Secondary | ICD-10-CM | POA: Diagnosis not present

## 2024-05-28 DIAGNOSIS — H26493 Other secondary cataract, bilateral: Secondary | ICD-10-CM | POA: Diagnosis not present

## 2024-06-04 DIAGNOSIS — Z9841 Cataract extraction status, right eye: Secondary | ICD-10-CM | POA: Diagnosis not present

## 2024-06-04 DIAGNOSIS — Z961 Presence of intraocular lens: Secondary | ICD-10-CM | POA: Diagnosis not present

## 2024-06-04 DIAGNOSIS — H2511 Age-related nuclear cataract, right eye: Secondary | ICD-10-CM | POA: Diagnosis not present

## 2024-06-15 ENCOUNTER — Telehealth: Payer: Self-pay | Admitting: Pharmacy Technician

## 2024-06-15 ENCOUNTER — Encounter: Payer: Self-pay | Admitting: Cardiovascular Disease

## 2024-06-15 ENCOUNTER — Ambulatory Visit: Attending: Cardiovascular Disease | Admitting: Cardiovascular Disease

## 2024-06-15 ENCOUNTER — Other Ambulatory Visit: Payer: Self-pay | Admitting: Pharmacist

## 2024-06-15 VITALS — BP 122/60 | HR 68 | Ht 63.0 in | Wt 192.7 lb

## 2024-06-15 DIAGNOSIS — L570 Actinic keratosis: Secondary | ICD-10-CM | POA: Diagnosis not present

## 2024-06-15 DIAGNOSIS — L821 Other seborrheic keratosis: Secondary | ICD-10-CM | POA: Diagnosis not present

## 2024-06-15 DIAGNOSIS — L820 Inflamed seborrheic keratosis: Secondary | ICD-10-CM | POA: Diagnosis not present

## 2024-06-15 DIAGNOSIS — L738 Other specified follicular disorders: Secondary | ICD-10-CM | POA: Diagnosis not present

## 2024-06-15 DIAGNOSIS — I10 Essential (primary) hypertension: Secondary | ICD-10-CM | POA: Diagnosis not present

## 2024-06-15 DIAGNOSIS — I255 Ischemic cardiomyopathy: Secondary | ICD-10-CM | POA: Insufficient documentation

## 2024-06-15 DIAGNOSIS — I2119 ST elevation (STEMI) myocardial infarction involving other coronary artery of inferior wall: Secondary | ICD-10-CM | POA: Diagnosis not present

## 2024-06-15 DIAGNOSIS — D485 Neoplasm of uncertain behavior of skin: Secondary | ICD-10-CM | POA: Diagnosis not present

## 2024-06-15 DIAGNOSIS — I2510 Atherosclerotic heart disease of native coronary artery without angina pectoris: Secondary | ICD-10-CM

## 2024-06-15 DIAGNOSIS — E782 Mixed hyperlipidemia: Secondary | ICD-10-CM | POA: Insufficient documentation

## 2024-06-15 DIAGNOSIS — R600 Localized edema: Secondary | ICD-10-CM | POA: Diagnosis not present

## 2024-06-15 DIAGNOSIS — L720 Epidermal cyst: Secondary | ICD-10-CM | POA: Diagnosis not present

## 2024-06-15 DIAGNOSIS — E785 Hyperlipidemia, unspecified: Secondary | ICD-10-CM

## 2024-06-15 DIAGNOSIS — Z85828 Personal history of other malignant neoplasm of skin: Secondary | ICD-10-CM | POA: Diagnosis not present

## 2024-06-15 DIAGNOSIS — D0461 Carcinoma in situ of skin of right upper limb, including shoulder: Secondary | ICD-10-CM | POA: Diagnosis not present

## 2024-06-15 MED ORDER — REPATHA SURECLICK 140 MG/ML ~~LOC~~ SOAJ: 1.0000 mL | SUBCUTANEOUS | 3 refills | Status: AC

## 2024-06-15 NOTE — Assessment & Plan Note (Signed)
 History of essential hypertension with blood pressure measured today at 122/60.  She is on amlodipine, carvedilol  and lisinopril .

## 2024-06-15 NOTE — Assessment & Plan Note (Signed)
 History of ischemic cardiomyopathy with an EF at the time of her intervention of 35 to 45% which ultimately came up to 45 to 50% and most recently by 2D echo performed 07/01/2023 her EF was 55 to 60% with grade 1 diastolic dysfunction and no significant valvular abnormalities.

## 2024-06-15 NOTE — Telephone Encounter (Signed)
   Patient Advocate Encounter   The patient was approved for a [employer] grant that will help cover the cost of repatha  Total amount awarded, 2500.00.  Effective: 07/01/24 - 06/30/25   APW:[id number] ERW:[id number] Hmnle:[id number] PI:[id number] [employer] ID: [id number]   Pharmacy provided with approval and processing information. Patient informed via mychart

## 2024-06-15 NOTE — Patient Instructions (Signed)
 Medication Instructions:  Your physician recommends that you continue on your current medications as directed. Please refer to the Current Medication list given to you today.  *If you need a refill on your cardiac medications before your next appointment, please call your pharmacy*    Follow-Up: At [facility], you and your health needs are our priority.  As part of our continuing mission to provide you with exceptional heart care, our providers are all part of one team.  This team includes your primary Cardiologist (physician) and Advanced Practice Providers or APPs (Physician Assistants and Nurse Practitioners) who all work together to provide you with the care you need, when you need it.  Your next appointment:   1 year(s)  Provider:   Dorn Lesches, MD

## 2024-06-15 NOTE — Assessment & Plan Note (Signed)
 Bilateral lower extreme edema improved with furosemide  and compression stockings.

## 2024-06-15 NOTE — Assessment & Plan Note (Addendum)
 History of hyperlipidemia on atorvastatin  40 mg and Repatha  with lipid profile performed 01/15/24 revealing total cholesterol 92, LDL 17 and HDL 49.

## 2024-06-15 NOTE — Assessment & Plan Note (Signed)
 History of STEMI 12/10/2018 with urgent cath by Dr. Darron via the right radial approach revealing an occluded RCA which was deemed to be a CTO with left-to-right collaterals and high-grade mid and distal LAD stenoses both of which were stented using drug-eluting stents.  She did have a 50% mid AV groove stenosis as well.  She denies chest pain or shortness of breath.

## 2024-06-15 NOTE — Progress Notes (Signed)
 06/15/2024 Erminio JONELLE [place]   [birth year]  [id number]  Primary Physician Rexanne Ingle, MD Primary Cardiologist: Dorn JINNY Lesches MD [facility] [place], [place]  HPI:  Desiree Rasmussen is a 83 y.o.  moderately overweight, married Caucasian female, mother of two, grandmother to two grandchildren, whose husband , Camesha Farooq, unfortunately passed away on 2013-11-16. He was a patient of mine as well. He apparently died of complications of pneumonia.  I last saw her in the office 06/12/2023 she is accompanied by her granddaughter Teresia. .  She has a history of hypertension and hyperlipidemia, as well as a family history of heart disease. She had chest pain and ruled out myocardial infarction on an ER visit earlier last year, and when I saw her she was complaining of atypical chest pain. A Myoview  stress test performed in our office on May 01, 2012, was entirely normal. I thought her pain was more musculoskeletal and/or radicular and her symptoms subsequently resolved. Since I saw her a year ago she's remained fairly stable and is asymptomatic. She apparently needs a right total knee replacement by Dr. Ernie 06/11/17.   She presented with an inferior STEMI 12/10/2018 and had urgent cath by Dr. Darron via the right radial approach revealing an occluded RCA which was deemed to be an CTO with left-to-right collaterals, high-grade mid and distal LAD stenoses which were both stented with drug-eluting stents and a 50% mid AV groove circumflex stenosis.  Her EF at the time of cath was 35 to 45% and subsequent echo 45 to 50%.  She remains on aspirin  and Brilinta  was discontinued.   Since I saw her a year ago she has been stable.  She denies chest pain or shortness of breath.  She unfortunately was diagnosed with squamous cell cancer in her neck and has had surgery for this.  She has been cancer free for 2 years now.    She is complained of bilateral lower extremity edema over the last year on furosemide  but really  denies increasing shortness of breath.  2D echocardiogram performed 07/01/2023 revealed EF of 55 to 60% with grade 1 diastolic dysfunction and no significant valvular abnormalities.    Current Meds  Medication Sig   amLODipine (NORVASC) 5 MG tablet Take 5 mg by mouth daily.   Ascorbic Acid (VITAMIN C PO) Take 500 mg by mouth every other day. Alternates days with multivitamin   aspirin  81 MG chewable tablet Chew 81 mg by mouth daily.   atorvastatin  (LIPITOR ) 80 MG tablet TAKE 1 TABLET BY MOUTH EVERY DAY AT 6PM   carvedilol  (COREG ) 25 MG tablet Take 25 mg by mouth 2 (two) times daily with a meal.   clorazepate  (TRANXENE ) 7.5 MG tablet Take 7.5 mg by mouth 2 (two) times daily as needed for anxiety.   Cyanocobalamin (VITAMIN B12) 1000 MCG TBCR Take 1,000 mcg by mouth daily.   Evolocumab  (REPATHA  SURECLICK) 140 MG/ML SOAJ Inject 140 mg into the skin every 14 (fourteen) days.   fluticasone  (FLONASE ) 50 MCG/ACT nasal spray Place 2 sprays into both nostrils daily as needed for allergies or rhinitis.   furosemide  (LASIX ) 20 MG tablet Take 20 mg daily. May take an additional 20 mg on the days of weight gain or swelling   levothyroxine  (SYNTHROID ) 100 MCG tablet Take 100 mcg by mouth daily before breakfast.   lisinopril  (ZESTRIL ) 40 MG tablet Take 20 mg by mouth daily.   liver oil-zinc  oxide (DESITIN) 40 % ointment Apply 1 application  topically as needed for irritation (UNDER THE BREASTS AND BETWEEN SKIN FOLDS).   loratadine (CLARITIN) 10 MG tablet Take 10 mg by mouth daily as needed for allergies or rhinitis.   Multiple Vitamins-Minerals (MULTIVITAMIN WITH MINERALS) tablet Take 1 tablet by mouth every other day. Alternates with vitamin c   nitroGLYCERIN  (NITROSTAT ) 0.4 MG SL tablet PLACE 1 TABLET UNDER TONGUE EVERY 5 MINUTES AS NEEDED FOR CHEST PAIN   omeprazole (PRILOSEC OTC) 20 MG tablet Take 20 mg by mouth daily as needed (acid reflux).   Polyvinyl Alcohol-Povidone (REFRESH OP) Place 1 drop into both  eyes daily as needed (dry eyes).     Allergies  Allergen Reactions   Lexapro [Escitalopram Oxalate] Diarrhea   Crestor [Rosuvastatin]     Muscle and Joint Pain    Doxycycline Nausea And Vomiting    Upset stomach, also   Erythromycin Other (See Comments)    upset stomach   Hydrocodone  Other (See Comments)    Affected mood negatively at 7.5-325 mg strength (tolerates 5-325)   Macrobid [Nitrofurantoin] Other (See Comments)    Didn't work   Sulfa Antibiotics Rash   Zoloft [Sertraline Hcl] Palpitations    Social History   Socioeconomic History   Marital status: Widowed    Spouse name: Not on file   Number of children: 2   Years of education: Not on file   Highest education level: Not on file  Occupational History   Not on file  Tobacco Use   Smoking status: Never   Smokeless tobacco: Never  Vaping Use   Vaping status: Never Used  Substance and Sexual Activity   Alcohol use: No   Drug use: Never   Sexual activity: Not Currently    Birth control/protection: Post-menopausal  Other Topics Concern   Not on file  Social History Narrative   Not on file   Social Drivers of Health   Financial Resource Strain: Not on file  Food Insecurity: Not on file  Transportation Needs: Not on file  Physical Activity: Not on file  Stress: Not on file  Social Connections: Unknown (02/13/2022)   Received from [facility]   Social Network    Social Network: Not on file  Intimate Partner Violence: Unknown (01/04/2022)   Received from [facility]   HITS    Physically Hurt: Not on file    Insult or Talk Down To: Not on file    Threaten Physical Harm: Not on file    Scream or Curse: Not on file     Review of Systems: General: negative for chills, fever, night sweats or weight changes.  Cardiovascular: negative for chest pain, dyspnea on exertion, edema, orthopnea, palpitations, paroxysmal nocturnal dyspnea or shortness of breath Dermatological: negative for rash Respiratory:  negative for cough or wheezing Urologic: negative for hematuria Abdominal: negative for nausea, vomiting, diarrhea, bright red blood per rectum, melena, or hematemesis Neurologic: negative for visual changes, syncope, or dizziness All other systems reviewed and are otherwise negative except as noted above.    Blood pressure 122/60, pulse 68, height 5' 3 (1.6 m), weight 192 lb 11.2 oz (87.4 kg), SpO2 94%.  General appearance: alert and no distress Neck: no adenopathy, no carotid bruit, no JVD, supple, symmetrical, trachea midline, and thyroid  not enlarged, symmetric, no tenderness/mass/nodules Lungs: clear to auscultation bilaterally Heart: regular rate and rhythm, S1, S2 normal, no murmur, click, rub or gallop Extremities: extremities normal, atraumatic, no cyanosis or edema Pulses: 2+ and symmetric Skin: Skin color, texture, turgor normal.  No rashes or lesions Neurologic: Grossly normal  EKG EKG Interpretation Date/Time:  Monday June 15 2024 10:27:43 EDT Ventricular Rate:  68 PR Interval:  160 QRS Duration:  94 QT Interval:  404 QTC Calculation: 429 R Axis:   -6  Text Interpretation: Normal sinus rhythm Inferior infarct , age undetermined When compared with ECG of 12-Jun-2023 08:47, No significant change was found Confirmed by Court Carrier [phone]) on 06/15/2024 10:30:29 AM    ASSESSMENT AND PLAN:   Essential hypertension History of essential hypertension with blood pressure measured today at 122/60.  She is on amlodipine, carvedilol  and lisinopril .  Hyperlipidemia History of hyperlipidemia on atorvastatin  40 mg and Repatha  with lipid profile performed 01/15/24 revealing total cholesterol 92, LDL 17 and HDL 49.  Acute ST elevation myocardial infarction (STEMI) of inferior wall (HCC) History of STEMI 12/10/2018 with urgent cath by Dr. Darron via the right radial approach revealing an occluded RCA which was deemed to be a CTO with left-to-right collaterals and high-grade  mid and distal LAD stenoses both of which were stented using drug-eluting stents.  She did have a 50% mid AV groove stenosis as well.  She denies chest pain or shortness of breath.  Ischemic cardiomyopathy History of ischemic cardiomyopathy with an EF at the time of her intervention of 35 to 45% which ultimately came up to 45 to 50% and most recently by 2D echo performed 07/01/2023 her EF was 55 to 60% with grade 1 diastolic dysfunction and no significant valvular abnormalities.  Bilateral lower extremity edema Bilateral lower extreme edema improved with furosemide  and compression stockings.     Carrier DOROTHA Court MD FACP,FACC,FAHA, [facility] 06/15/2024 10:41 AM

## 2024-06-16 DIAGNOSIS — H26492 Other secondary cataract, left eye: Secondary | ICD-10-CM | POA: Diagnosis not present

## 2024-06-17 DIAGNOSIS — M25511 Pain in right shoulder: Secondary | ICD-10-CM | POA: Diagnosis not present

## 2024-06-17 DIAGNOSIS — M25551 Pain in right hip: Secondary | ICD-10-CM | POA: Diagnosis not present

## 2024-06-17 DIAGNOSIS — M25512 Pain in left shoulder: Secondary | ICD-10-CM | POA: Diagnosis not present

## 2024-06-17 DIAGNOSIS — M25552 Pain in left hip: Secondary | ICD-10-CM | POA: Diagnosis not present

## 2024-07-02 DIAGNOSIS — S51802A Unspecified open wound of left forearm, initial encounter: Secondary | ICD-10-CM | POA: Diagnosis not present

## 2024-07-02 DIAGNOSIS — M79632 Pain in left forearm: Secondary | ICD-10-CM | POA: Diagnosis not present

## 2024-07-02 DIAGNOSIS — H2512 Age-related nuclear cataract, left eye: Secondary | ICD-10-CM | POA: Diagnosis not present

## 2024-07-02 DIAGNOSIS — Z961 Presence of intraocular lens: Secondary | ICD-10-CM | POA: Diagnosis not present

## 2024-07-02 DIAGNOSIS — S51801A Unspecified open wound of right forearm, initial encounter: Secondary | ICD-10-CM | POA: Diagnosis not present

## 2024-07-02 DIAGNOSIS — M79631 Pain in right forearm: Secondary | ICD-10-CM | POA: Diagnosis not present

## 2024-07-04 DIAGNOSIS — S01119A Laceration without foreign body of unspecified eyelid and periocular area, initial encounter: Secondary | ICD-10-CM | POA: Diagnosis not present

## 2024-07-15 DIAGNOSIS — M5442 Lumbago with sciatica, left side: Secondary | ICD-10-CM | POA: Diagnosis not present

## 2024-07-15 DIAGNOSIS — K5900 Constipation, unspecified: Secondary | ICD-10-CM | POA: Diagnosis not present

## 2024-07-15 DIAGNOSIS — F331 Major depressive disorder, recurrent, moderate: Secondary | ICD-10-CM | POA: Diagnosis not present

## 2024-07-15 DIAGNOSIS — E7800 Pure hypercholesterolemia, unspecified: Secondary | ICD-10-CM | POA: Diagnosis not present

## 2024-07-15 DIAGNOSIS — F419 Anxiety disorder, unspecified: Secondary | ICD-10-CM | POA: Diagnosis not present

## 2024-07-15 DIAGNOSIS — N3281 Overactive bladder: Secondary | ICD-10-CM | POA: Diagnosis not present

## 2024-07-15 DIAGNOSIS — E1122 Type 2 diabetes mellitus with diabetic chronic kidney disease: Secondary | ICD-10-CM | POA: Diagnosis not present

## 2024-07-15 DIAGNOSIS — I10 Essential (primary) hypertension: Secondary | ICD-10-CM | POA: Diagnosis not present

## 2024-07-15 DIAGNOSIS — I2510 Atherosclerotic heart disease of native coronary artery without angina pectoris: Secondary | ICD-10-CM | POA: Diagnosis not present

## 2024-07-15 DIAGNOSIS — Z23 Encounter for immunization: Secondary | ICD-10-CM | POA: Diagnosis not present

## 2024-07-15 DIAGNOSIS — M8580 Other specified disorders of bone density and structure, unspecified site: Secondary | ICD-10-CM | POA: Diagnosis not present

## 2024-07-15 DIAGNOSIS — E039 Hypothyroidism, unspecified: Secondary | ICD-10-CM | POA: Diagnosis not present

## 2024-07-24 DIAGNOSIS — H903 Sensorineural hearing loss, bilateral: Secondary | ICD-10-CM | POA: Diagnosis not present

## 2024-07-24 DIAGNOSIS — Z85828 Personal history of other malignant neoplasm of skin: Secondary | ICD-10-CM | POA: Diagnosis not present

## 2024-07-28 DIAGNOSIS — H903 Sensorineural hearing loss, bilateral: Secondary | ICD-10-CM | POA: Diagnosis not present

## 2024-08-26 ENCOUNTER — Other Ambulatory Visit (HOSPITAL_COMMUNITY): Payer: Self-pay | Admitting: Internal Medicine

## 2024-08-26 ENCOUNTER — Ambulatory Visit (HOSPITAL_COMMUNITY)
Admission: RE | Admit: 2024-08-26 | Discharge: 2024-08-26 | Disposition: A | Discharge status: Home / Self Care | Source: Ambulatory Visit | Attending: Vascular Surgery | Admitting: Vascular Surgery

## 2024-08-26 DIAGNOSIS — R609 Edema, unspecified: Secondary | ICD-10-CM | POA: Diagnosis not present

## 2024-08-26 DIAGNOSIS — R600 Localized edema: Secondary | ICD-10-CM | POA: Diagnosis not present

## 2024-09-08 DIAGNOSIS — M25512 Pain in left shoulder: Secondary | ICD-10-CM | POA: Diagnosis not present

## 2024-09-08 DIAGNOSIS — M25551 Pain in right hip: Secondary | ICD-10-CM | POA: Diagnosis not present

## 2024-09-08 DIAGNOSIS — M25552 Pain in left hip: Secondary | ICD-10-CM | POA: Diagnosis not present

## 2024-09-08 DIAGNOSIS — M25511 Pain in right shoulder: Secondary | ICD-10-CM | POA: Diagnosis not present

## 2024-09-14 DIAGNOSIS — M4726 Other spondylosis with radiculopathy, lumbar region: Secondary | ICD-10-CM | POA: Diagnosis not present

## 2024-09-14 DIAGNOSIS — M51362 Other intervertebral disc degeneration, lumbar region with discogenic back pain and lower extremity pain: Secondary | ICD-10-CM | POA: Diagnosis not present
